# Patient Record
Sex: Female | Born: 1950 | Race: Black or African American | Hispanic: No | Smoking: Never smoker
Health system: Southern US, Community
[De-identification: ages and names within clinical notes are randomized; demographics above are authoritative.]

## PROBLEM LIST (undated history)

## (undated) DIAGNOSIS — I10 Essential (primary) hypertension: Secondary | ICD-10-CM

## (undated) DIAGNOSIS — H269 Unspecified cataract: Secondary | ICD-10-CM

## (undated) DIAGNOSIS — R7303 Prediabetes: Secondary | ICD-10-CM

## (undated) DIAGNOSIS — R112 Nausea with vomiting, unspecified: Secondary | ICD-10-CM

## (undated) DIAGNOSIS — D689 Coagulation defect, unspecified: Secondary | ICD-10-CM

## (undated) DIAGNOSIS — R011 Cardiac murmur, unspecified: Secondary | ICD-10-CM

## (undated) DIAGNOSIS — L988 Other specified disorders of the skin and subcutaneous tissue: Secondary | ICD-10-CM

## (undated) DIAGNOSIS — Z940 Kidney transplant status: Secondary | ICD-10-CM

## (undated) DIAGNOSIS — N189 Chronic kidney disease, unspecified: Secondary | ICD-10-CM

## (undated) HISTORY — PX: EYE SURGERY: SHX253

## (undated) HISTORY — PX: COLONOSCOPY: SHX174

## (undated) HISTORY — DX: Unspecified cataract: H26.9

## (undated) HISTORY — DX: Coagulation defect, unspecified: D68.9

## (undated) HISTORY — PX: TONSILLECTOMY: SUR1361

## (undated) HISTORY — PX: DILATION AND CURETTAGE OF UTERUS: SHX78

## (undated) HISTORY — DX: Prediabetes: R73.03

## (undated) HISTORY — PX: CHOLECYSTECTOMY: SHX55

## (undated) HISTORY — DX: Essential (primary) hypertension: I10

## (undated) HISTORY — PX: KIDNEY TRANSPLANT: SHX239

## (undated) HISTORY — PX: CARDIAC CATHETERIZATION: SHX172

## (undated) HISTORY — PX: POLYPECTOMY: SHX149

## (undated) HISTORY — DX: Kidney transplant status: Z94.0

## (undated) HISTORY — DX: Chronic kidney disease, unspecified: N18.9

---

## 1988-05-15 HISTORY — PX: HIP SURGERY: SHX245

## 2008-05-15 DIAGNOSIS — I2699 Other pulmonary embolism without acute cor pulmonale: Secondary | ICD-10-CM

## 2008-05-15 HISTORY — DX: Other pulmonary embolism without acute cor pulmonale: I26.99

## 2019-11-27 ENCOUNTER — Other Ambulatory Visit: Payer: Self-pay

## 2019-11-27 ENCOUNTER — Ambulatory Visit (INDEPENDENT_AMBULATORY_CARE_PROVIDER_SITE_OTHER): Payer: Medicare Other | Admitting: Emergency Medicine

## 2019-11-27 ENCOUNTER — Encounter: Payer: Self-pay | Admitting: Emergency Medicine

## 2019-11-27 VITALS — BP 136/81 | HR 67 | Temp 97.5°F | Resp 16 | Ht 61.0 in | Wt 206.0 lb

## 2019-11-27 DIAGNOSIS — R7303 Prediabetes: Secondary | ICD-10-CM | POA: Insufficient documentation

## 2019-11-27 DIAGNOSIS — Z940 Kidney transplant status: Secondary | ICD-10-CM | POA: Diagnosis not present

## 2019-11-27 DIAGNOSIS — I10 Essential (primary) hypertension: Secondary | ICD-10-CM | POA: Diagnosis not present

## 2019-11-27 DIAGNOSIS — E669 Obesity, unspecified: Secondary | ICD-10-CM | POA: Insufficient documentation

## 2019-11-27 DIAGNOSIS — E785 Hyperlipidemia, unspecified: Secondary | ICD-10-CM | POA: Diagnosis not present

## 2019-11-27 DIAGNOSIS — Z7689 Persons encountering health services in other specified circumstances: Secondary | ICD-10-CM

## 2019-11-27 DIAGNOSIS — Z6838 Body mass index (BMI) 38.0-38.9, adult: Secondary | ICD-10-CM | POA: Insufficient documentation

## 2019-11-27 LAB — COMPREHENSIVE METABOLIC PANEL
ALT: 7 IU/L (ref 0–32)
AST: 17 IU/L (ref 0–40)
Albumin/Globulin Ratio: 1.6 (ref 1.2–2.2)
Albumin: 4.1 g/dL (ref 3.8–4.8)
Alkaline Phosphatase: 68 IU/L (ref 48–121)
BUN/Creatinine Ratio: 12 (ref 12–28)
BUN: 20 mg/dL (ref 8–27)
Bilirubin Total: 0.8 mg/dL (ref 0.0–1.2)
CO2: 24 mmol/L (ref 20–29)
Calcium: 9.8 mg/dL (ref 8.7–10.3)
Chloride: 108 mmol/L — ABNORMAL HIGH (ref 96–106)
Creatinine, Ser: 1.71 mg/dL — ABNORMAL HIGH (ref 0.57–1.00)
GFR calc Af Amer: 35 mL/min/{1.73_m2} — ABNORMAL LOW (ref >59)
GFR calc non Af Amer: 31 mL/min/{1.73_m2} — ABNORMAL LOW (ref >59)
Globulin, Total: 2.5 g/dL (ref 1.5–4.5)
Glucose: 122 mg/dL — ABNORMAL HIGH (ref 65–99)
Potassium: 4 mmol/L (ref 3.5–5.2)
Sodium: 146 mmol/L — ABNORMAL HIGH (ref 134–144)
Total Protein: 6.6 g/dL (ref 6.0–8.5)

## 2019-11-27 LAB — CBC WITH DIFFERENTIAL/PLATELET
Basophils Absolute: 0.1 10*3/uL (ref 0.0–0.2)
Basos: 1 %
EOS (ABSOLUTE): 0.1 10*3/uL (ref 0.0–0.4)
Eos: 1 %
Hematocrit: 41.3 % (ref 34.0–46.6)
Hemoglobin: 13.6 g/dL (ref 11.1–15.9)
Immature Grans (Abs): 0 10*3/uL (ref 0.0–0.1)
Immature Granulocytes: 0 %
Lymphocytes Absolute: 1.5 10*3/uL (ref 0.7–3.1)
Lymphs: 21 %
MCH: 28.4 pg (ref 26.6–33.0)
MCHC: 32.9 g/dL (ref 31.5–35.7)
MCV: 86 fL (ref 79–97)
Monocytes Absolute: 1 10*3/uL — ABNORMAL HIGH (ref 0.1–0.9)
Monocytes: 13 %
Neutrophils Absolute: 4.5 10*3/uL (ref 1.4–7.0)
Neutrophils: 64 %
Platelets: 263 10*3/uL (ref 150–450)
RBC: 4.79 x10E6/uL (ref 3.77–5.28)
RDW: 13.5 % (ref 11.7–15.4)
WBC: 7.1 10*3/uL (ref 3.4–10.8)

## 2019-11-27 LAB — LIPID PANEL
Chol/HDL Ratio: 2.8 ratio (ref 0.0–4.4)
Cholesterol, Total: 203 mg/dL — ABNORMAL HIGH (ref 100–199)
HDL: 73 mg/dL (ref >39)
LDL Chol Calc (NIH): 116 mg/dL — ABNORMAL HIGH (ref 0–99)
Triglycerides: 81 mg/dL (ref 0–149)
VLDL Cholesterol Cal: 14 mg/dL (ref 5–40)

## 2019-11-27 LAB — HEMOGLOBIN A1C
Est. average glucose Bld gHb Est-mCnc: 137 mg/dL
Hgb A1c MFr Bld: 6.4 % — ABNORMAL HIGH (ref 4.8–5.6)

## 2019-11-27 NOTE — Patient Instructions (Addendum)
   If you have lab work done today you will be contacted with your lab results within the next 2 weeks.  If you have not heard from us then please contact us. The fastest way to get your results is to register for My Chart.   IF you received an x-ray today, you will receive an invoice from [facility]. Please contact [facility] at [phone] with questions or concerns regarding your invoice.   IF you received labwork today, you will receive an invoice from [facility]. Please contact [facility] at [phone] with questions or concerns regarding your invoice.   Our billing staff will not be able to assist you with questions regarding bills from these companies.  You will be contacted with the lab results as soon as they are available. The fastest way to get your results is to activate your My Chart account. Instructions are located on the last page of this paperwork. If you have not heard from us regarding the results in 2 weeks, please contact this office.      Hypertension, Adult High blood pressure (hypertension) is when the force of blood pumping through the arteries is too strong. The arteries are the blood vessels that carry blood from the heart throughout the body. Hypertension forces the heart to work harder to pump blood and may cause arteries to become narrow or stiff. Untreated or uncontrolled hypertension can cause a heart attack, heart failure, a stroke, kidney disease, and other problems. A blood pressure reading consists of a higher number over a lower number. Ideally, your blood pressure should be below 120/80. The first ("top") number is called the systolic pressure. It is a measure of the pressure in your arteries as your heart beats. The second ("bottom") number is called the diastolic pressure. It is a measure of the pressure in your arteries as the heart relaxes. What are the causes? The exact cause of this condition is not known. There are some conditions  that result in or are related to high blood pressure. What increases the risk? Some risk factors for high blood pressure are under your control. The following factors may make you more likely to develop this condition:  Smoking.  Having type 2 diabetes mellitus, high cholesterol, or both.  Not getting enough exercise or physical activity.  Being overweight.  Having too much fat, sugar, calories, or salt (sodium) in your diet.  Drinking too much alcohol. Some risk factors for high blood pressure may be difficult or impossible to change. Some of these factors include:  Having chronic kidney disease.  Having a family history of high blood pressure.  Age. Risk increases with age.  Race. You may be at higher risk if you are African American.  Gender. Men are at higher risk than women before age 45. After age 65, women are at higher risk than men.  Having obstructive sleep apnea.  Stress. What are the signs or symptoms? High blood pressure may not cause symptoms. Very high blood pressure (hypertensive crisis) may cause:  Headache.  Anxiety.  Shortness of breath.  Nosebleed.  Nausea and vomiting.  Vision changes.  Severe chest pain.  Seizures. How is this diagnosed? This condition is diagnosed by measuring your blood pressure while you are seated, with your arm resting on a flat surface, your legs uncrossed, and your feet flat on the floor. The cuff of the blood pressure monitor will be placed directly against the skin of your upper arm at the level of your   heart. It should be measured at least twice using the same arm. Certain conditions can cause a difference in blood pressure between your right and left arms. Certain factors can cause blood pressure readings to be lower or higher than normal for a short period of time:  When your blood pressure is higher when you are in a health care provider's office than when you are at home, this is called white coat hypertension.  Most people with this condition do not need medicines.  When your blood pressure is higher at home than when you are in a health care provider's office, this is called masked hypertension. Most people with this condition may need medicines to control blood pressure. If you have a high blood pressure reading during one visit or you have normal blood pressure with other risk factors, you may be asked to:  Return on a different day to have your blood pressure checked again.  Monitor your blood pressure at home for 1 week or longer. If you are diagnosed with hypertension, you may have other blood or imaging tests to help your health care provider understand your overall risk for other conditions. How is this treated? This condition is treated by making healthy lifestyle changes, such as eating healthy foods, exercising more, and reducing your alcohol intake. Your health care provider may prescribe medicine if lifestyle changes are not enough to get your blood pressure under control, and if:  Your systolic blood pressure is above 130.  Your diastolic blood pressure is above 80. Your personal target blood pressure may vary depending on your medical conditions, your age, and other factors. Follow these instructions at home: Eating and drinking   Eat a diet that is high in fiber and potassium, and low in sodium, added sugar, and fat. An example eating plan is called the DASH (Dietary Approaches to Stop Hypertension) diet. To eat this way: ? Eat plenty of fresh fruits and vegetables. Try to fill one half of your plate at each meal with fruits and vegetables. ? Eat whole grains, such as whole-wheat pasta, brown rice, or whole-grain bread. Fill about one fourth of your plate with whole grains. ? Eat or drink low-fat dairy products, such as skim milk or low-fat yogurt. ? Avoid fatty cuts of meat, processed or cured meats, and poultry with skin. Fill about one fourth of your plate with lean proteins, such  as fish, chicken without skin, beans, eggs, or tofu. ? Avoid pre-made and processed foods. These tend to be higher in sodium, added sugar, and fat.  Reduce your daily sodium intake. Most people with hypertension should eat less than 1,500 mg of sodium a day.  Do not drink alcohol if: ? Your health care provider tells you not to drink. ? You are pregnant, may be pregnant, or are planning to become pregnant.  If you drink alcohol: ? Limit how much you use to:  0-1 drink a day for women.  0-2 drinks a day for men. ? Be aware of how much alcohol is in your drink. In the [place] one drink equals one 12 oz bottle of beer (355 mL), one 5 oz glass of wine (148 mL), or one 1 oz glass of hard liquor (44 mL). Lifestyle   Work with your health care provider to maintain a healthy body weight or to lose weight. Ask what an ideal weight is for you.  Get at least 30 minutes of exercise most days of the week. Activities may include walking, swimming,   or biking.  Include exercise to strengthen your muscles (resistance exercise), such as Pilates or lifting weights, as part of your weekly exercise routine. Try to do these types of exercises for 30 minutes at least 3 days a week.  Do not use any products that contain nicotine or tobacco, such as cigarettes, e-cigarettes, and chewing tobacco. If you need help quitting, ask your health care provider.  Monitor your blood pressure at home as told by your health care provider.  Keep all follow-up visits as told by your health care provider. This is important. Medicines  Take over-the-counter and prescription medicines only as told by your health care provider. Follow directions carefully. Blood pressure medicines must be taken as prescribed.  Do not skip doses of blood pressure medicine. Doing this puts you at risk for problems and can make the medicine less effective.  Ask your health care provider about side effects or reactions to medicines that you  should watch for. Contact a health care provider if you:  Think you are having a reaction to a medicine you are taking.  Have headaches that keep coming back (recurring).  Feel dizzy.  Have swelling in your ankles.  Have trouble with your vision. Get help right away if you:  Develop a severe headache or confusion.  Have unusual weakness or numbness.  Feel faint.  Have severe pain in your chest or abdomen.  Vomit repeatedly.  Have trouble breathing. Summary  Hypertension is when the force of blood pumping through your arteries is too strong. If this condition is not controlled, it may put you at risk for serious complications.  Your personal target blood pressure may vary depending on your medical conditions, your age, and other factors. For most people, a normal blood pressure is less than 120/80.  Hypertension is treated with lifestyle changes, medicines, or a combination of both. Lifestyle changes include losing weight, eating a healthy, low-sodium diet, exercising more, and limiting alcohol. This information is not intended to replace advice given to you by your health care provider. Make sure you discuss any questions you have with your health care provider. Document Revised: 01/09/2018 Document Reviewed: 01/09/2018 Elsevier Patient Education  2020 Elsevier Inc.  

## 2019-11-27 NOTE — Progress Notes (Signed)
Megan Bradford 69 y.o.   Chief Complaint  Patient presents with  . Establish Care    moved here from [place] about 4 months ago  . Hypertension    and taking cholesterol medication    HISTORY OF PRESENT ILLNESS: This is a 69 y.o. female first visit to this office, here to establish care with me. Recently moved from [facility] about 4 months ago. Has the following chronic medical problems: 1.  Status post kidney transplant about 5 years ago.  Needs nephrology and oncology referrals. 2.  Hypertension: On amlodipine and carvedilol. 3.  Dyslipidemia: Simvastatin 4.  History of bilateral cataracts. 5.  History of prediabetes  Fully vaccinated against Covid. Denies any other significant past medical history. No other complaints or medical concerns.  HPI   Prior to Admission medications   Medication Sig Start Date End Date Taking? Authorizing Provider  amLODipine (NORVASC) 5 MG tablet Take 5 mg by mouth daily.   Yes [provider]  aspirin EC 81 MG tablet Take 81 mg by mouth daily. Swallow whole.   Yes [provider]  carvedilol (COREG) 12.5 MG tablet Take 12.5 mg by mouth 2 (two) times daily with a meal.   Yes [provider]  magnesium oxide (MAG-OX) 400 MG tablet Take 400 mg by mouth daily.   Yes [provider]  multivitamin-lutein (OCUVITE-LUTEIN) CAPS capsule Take 1 capsule by mouth daily.   Yes [provider]  mycophenolate (MYFORTIC) 180 MG EC tablet Take 180 mg by mouth 2 (two) times daily.   Yes [provider]  pyridOXINE (VITAMIN B-6) 100 MG tablet Take 100 mg by mouth daily.   Yes [provider]  simvastatin (ZOCOR) 20 MG tablet Take 20 mg by mouth daily.   Yes [provider]  Tacrolimus ER 1 MG CP24 Take by mouth in the morning and at bedtime.   Yes [provider]    No Known Allergies  There are no problems to display for this patient.   Past Medical History:   Diagnosis Date  . Cataract    [employer]rees[employer] 11/24/2019  . Chronic kidney disease    [employer]rees[employer] 11/24/2019  . Hypertension    [employer] 11/24/2019    Past Surgical History:  Procedure Laterality Date  . CESAREAN SECTION N/A    [employer] 11/24/2019  . CHOLECYSTECTOMY N/A    [employer] 11/24/2019  . EYE SURGERY N/A    [employer] 11/24/2019    Social History   Socioeconomic History  . Marital status: Widowed    Spouse name: Not on file  . Number of children: Not on file  . Years of education: Not on file  . Highest education level: Not on file  Occupational History  . Not on file  Tobacco Use  . Smoking status: Never Smoker  . Smokeless tobacco: Never Used  Substance and Sexual Activity  . Alcohol use: Never  . Drug use: Never  . Sexual activity: Not on file  Other Topics Concern  . Not on file  Social History Narrative  . Not on file   Social Determinants of Health   Financial Resource Strain:   . Difficulty of Paying Living Expenses:   Food Insecurity:   . Worried About Charity fundraiser in the Last Year:   . Arboriculturist in the Last Year:   Transportation Needs:   . Film/video editor (Medical):   Marland Kitchen Lack of Transportation (Non-Medical):   Physical Activity:   .  Days of Exercise per Week:   . Minutes of Exercise per Session:   Stress:   . Feeling of Stress :   Social Connections:   . Frequency of Communication with Friends and Family:   . Frequency of Social Gatherings with Friends and Family:   . Attends Religious Services:   . Active Member of Clubs or Organizations:   . Attends Archivist Meetings:   Marland Kitchen Marital Status:   Intimate Partner Violence:   . Fear of Current or Ex-Partner:   . Emotionally Abused:   Marland Kitchen Physically Abused:   . Sexually Abused:     History reviewed. No pertinent family history.   Review of Systems  Constitutional: Negative.  Negative for chills and fever.  HENT: Negative.  Negative for congestion and sore throat.    Respiratory: Negative.  Negative for cough and shortness of breath.   Cardiovascular: Negative.  Negative for chest pain and palpitations.  Gastrointestinal: Negative for abdominal pain, diarrhea, nausea and vomiting.  Genitourinary: Negative.  Negative for dysuria and hematuria.  Skin: Negative.  Negative for rash.  Neurological: Negative.  Negative for dizziness and headaches.  All other systems reviewed and are negative.    Today's Vitals   11/27/19 0905  BP: 136/81  Pulse: 67  Resp: 16  Temp: (!) 97.5 F (36.4 C)  TempSrc: Temporal  SpO2: 97%  Weight: 206 lb (93.4 kg)  Height: 5\' 1"  (1.549 m)   Body mass index is 38.92 kg/m.   Physical Exam Vitals reviewed.  Constitutional:      Appearance: Normal appearance. She is obese.  HENT:     Head: Normocephalic.  Eyes:     Extraocular Movements: Extraocular movements intact.     Conjunctiva/sclera: Conjunctivae normal.     Pupils: Pupils are equal, round, and reactive to light.  Cardiovascular:     Rate and Rhythm: Normal rate and regular rhythm.     Heart sounds: Normal heart sounds.  Pulmonary:     Effort: Pulmonary effort is normal.     Breath sounds: Normal breath sounds.  Musculoskeletal:        General: Normal range of motion.     Cervical back: Normal range of motion.  Skin:    General: Skin is warm and dry.     Capillary Refill: Capillary refill takes less than 2 seconds.  Neurological:     General: No focal deficit present.     Mental Status: She is alert and oriented to person, place, and time.  Psychiatric:        Mood and Affect: Mood normal.        Behavior: Behavior normal.      ASSESSMENT & PLAN: Megan Bradford was seen today for establish care and hypertension.  Diagnoses and all orders for this visit:  Encounter to establish care  History of kidney transplant -     Ambulatory referral to Nephrology -     Ambulatory referral to Oncology  Essential hypertension -     CBC with  Differential/Platelet -     Comprehensive metabolic panel  Dyslipidemia -     Lipid panel  Prediabetes -     Hemoglobin A1c  Body mass index (BMI) of 38.0-38.9 in adult    Patient Instructions       If you have lab work done today you will be contacted with your lab results within the next 2 weeks.  If you have not heard from [place] then please contact us. The  fastest way to get your results is to register for My Chart.   IF you received an x-ray today, you will receive an invoice from [facility]. Please contact [facility] at [phone] with questions or concerns regarding your invoice.   IF you received labwork today, you will receive an invoice from [facility]. Please contact [facility] at [phone] with questions or concerns regarding your invoice.   Our billing staff will not be able to assist you with questions regarding bills from these companies.  You will be contacted with the lab results as soon as they are available. The fastest way to get your results is to activate your My Chart account. Instructions are located on the last page of this paperwork. If you have not heard from [place] regarding the results in 2 weeks, please contact this office.     Hypertension, Adult High blood pressure (hypertension) is when the force of blood pumping through the arteries is too strong. The arteries are the blood vessels that carry blood from the heart throughout the body. Hypertension forces the heart to work harder to pump blood and may cause arteries to become narrow or stiff. Untreated or uncontrolled hypertension can cause a heart attack, heart failure, a stroke, kidney disease, and other problems. A blood pressure reading consists of a higher number over a lower number. Ideally, your blood pressure should be below 120/80. The first ("top") number is called the systolic pressure. It is a measure of the pressure in your arteries as your heart beats. The second ("bottom")  number is called the diastolic pressure. It is a measure of the pressure in your arteries as the heart relaxes. What are the causes? The exact cause of this condition is not known. There are some conditions that result in or are related to high blood pressure. What increases the risk? Some risk factors for high blood pressure are under your control. The following factors may make you more likely to develop this condition:  Smoking.  Having type 2 diabetes mellitus, high cholesterol, or both.  Not getting enough exercise or physical activity.  Being overweight.  Having too much fat, sugar, calories, or salt (sodium) in your diet.  Drinking too much alcohol. Some risk factors for high blood pressure may be difficult or impossible to change. Some of these factors include:  Having chronic kidney disease.  Having a family history of high blood pressure.  Age. Risk increases with age.  Race. You may be at higher risk if you are African American.  Gender. Men are at higher risk than women before age 20. After age 70, women are at higher risk than men.  Having obstructive sleep apnea.  Stress. What are the signs or symptoms? High blood pressure may not cause symptoms. Very high blood pressure (hypertensive crisis) may cause:  Headache.  Anxiety.  Shortness of breath.  Nosebleed.  Nausea and vomiting.  Vision changes.  Severe chest pain.  Seizures. How is this diagnosed? This condition is diagnosed by measuring your blood pressure while you are seated, with your arm resting on a flat surface, your legs uncrossed, and your feet flat on the floor. The cuff of the blood pressure monitor will be placed directly against the skin of your upper arm at the level of your heart. It should be measured at least twice using the same arm. Certain conditions can cause a difference in blood pressure between your right and left arms. Certain factors can cause blood pressure readings to be  lower or higher than normal for a short period of time:  When your blood pressure is higher when you are in a health care provider's office than when you are at home, this is called white coat hypertension. Most people with this condition do not need medicines.  When your blood pressure is higher at home than when you are in a health care provider's office, this is called masked hypertension. Most people with this condition may need medicines to control blood pressure. If you have a high blood pressure reading during one visit or you have normal blood pressure with other risk factors, you may be asked to:  Return on a different day to have your blood pressure checked again.  Monitor your blood pressure at home for 1 week or longer. If you are diagnosed with hypertension, you may have other blood or imaging tests to help your health care provider understand your overall risk for other conditions. How is this treated? This condition is treated by making healthy lifestyle changes, such as eating healthy foods, exercising more, and reducing your alcohol intake. Your health care provider may prescribe medicine if lifestyle changes are not enough to get your blood pressure under control, and if:  Your systolic blood pressure is above 130.  Your diastolic blood pressure is above 80. Your personal target blood pressure may vary depending on your medical conditions, your age, and other factors. Follow these instructions at home: Eating and drinking   Eat a diet that is high in fiber and potassium, and low in sodium, added sugar, and fat. An example eating plan is called the DASH (Dietary Approaches to Stop Hypertension) diet. To eat this way: ? Eat plenty of fresh fruits and vegetables. Try to fill one half of your plate at each meal with fruits and vegetables. ? Eat whole grains, such as whole-wheat pasta, brown rice, or whole-grain bread. Fill about one fourth of your plate with whole grains. ? Eat  or drink low-fat dairy products, such as skim milk or low-fat yogurt. ? Avoid fatty cuts of meat, processed or cured meats, and poultry with skin. Fill about one fourth of your plate with lean proteins, such as fish, chicken without skin, beans, eggs, or tofu. ? Avoid pre-made and processed foods. These tend to be higher in sodium, added sugar, and fat.  Reduce your daily sodium intake. Most people with hypertension should eat less than 1,500 mg of sodium a day.  Do not drink alcohol if: ? Your health care provider tells you not to drink. ? You are pregnant, may be pregnant, or are planning to become pregnant.  If you drink alcohol: ? Limit how much you use to:  0-1 drink a day for women.  0-2 drinks a day for men. ? Be aware of how much alcohol is in your drink. In the [place] one drink equals one 12 oz bottle of beer (355 mL), one 5 oz glass of wine (148 mL), or one 1 oz glass of hard liquor (44 mL). Lifestyle   Work with your health care provider to maintain a healthy body weight or to lose weight. Ask what an ideal weight is for you.  Get at least 30 minutes of exercise most days of the week. Activities may include walking, swimming, or biking.  Include exercise to strengthen your muscles (resistance exercise), such as Pilates or lifting weights, as part of your weekly exercise routine. Try to do these types of exercises for 30 minutes at least 3  days a week.  Do not use any products that contain nicotine or tobacco, such as cigarettes, e-cigarettes, and chewing tobacco. If you need help quitting, ask your health care provider.  Monitor your blood pressure at home as told by your health care provider.  Keep all follow-up visits as told by your health care provider. This is important. Medicines  Take over-the-counter and prescription medicines only as told by your health care provider. Follow directions carefully. Blood pressure medicines must be taken as prescribed.  Do not skip  doses of blood pressure medicine. Doing this puts you at risk for problems and can make the medicine less effective.  Ask your health care provider about side effects or reactions to medicines that you should watch for. Contact a health care provider if you:  Think you are having a reaction to a medicine you are taking.  Have headaches that keep coming back (recurring).  Feel dizzy.  Have swelling in your ankles.  Have trouble with your vision. Get help right away if you:  Develop a severe headache or confusion.  Have unusual weakness or numbness.  Feel faint.  Have severe pain in your chest or abdomen.  Vomit repeatedly.  Have trouble breathing. Summary  Hypertension is when the force of blood pumping through your arteries is too strong. If this condition is not controlled, it may put you at risk for serious complications.  Your personal target blood pressure may vary depending on your medical conditions, your age, and other factors. For most people, a normal blood pressure is less than 120/80.  Hypertension is treated with lifestyle changes, medicines, or a combination of both. Lifestyle changes include losing weight, eating a healthy, low-sodium diet, exercising more, and limiting alcohol. This information is not intended to replace advice given to you by your health care provider. Make sure you discuss any questions you have with your health care provider. Document Revised: 01/09/2018 Document Reviewed: 01/09/2018 Elsevier Patient Education  2020 Elsevier Inc.      Agustina Caroli, MD [facility]

## 2019-12-01 ENCOUNTER — Encounter (HOSPITAL_COMMUNITY): Payer: Self-pay

## 2019-12-01 ENCOUNTER — Ambulatory Visit (HOSPITAL_COMMUNITY)
Admission: EM | Admit: 2019-12-01 | Discharge: 2019-12-01 | Disposition: A | Discharge status: Home / Self Care | Payer: Medicare Other | Attending: Emergency Medicine | Admitting: Emergency Medicine

## 2019-12-01 ENCOUNTER — Other Ambulatory Visit: Payer: Self-pay

## 2019-12-01 DIAGNOSIS — Z940 Kidney transplant status: Secondary | ICD-10-CM | POA: Insufficient documentation

## 2019-12-01 DIAGNOSIS — R7303 Prediabetes: Secondary | ICD-10-CM | POA: Insufficient documentation

## 2019-12-01 DIAGNOSIS — Z7901 Long term (current) use of anticoagulants: Secondary | ICD-10-CM | POA: Insufficient documentation

## 2019-12-01 DIAGNOSIS — Z7982 Long term (current) use of aspirin: Secondary | ICD-10-CM | POA: Insufficient documentation

## 2019-12-01 DIAGNOSIS — M546 Pain in thoracic spine: Secondary | ICD-10-CM | POA: Diagnosis not present

## 2019-12-01 DIAGNOSIS — Z79899 Other long term (current) drug therapy: Secondary | ICD-10-CM | POA: Diagnosis not present

## 2019-12-01 DIAGNOSIS — I129 Hypertensive chronic kidney disease with stage 1 through stage 4 chronic kidney disease, or unspecified chronic kidney disease: Secondary | ICD-10-CM | POA: Insufficient documentation

## 2019-12-01 DIAGNOSIS — Z20822 Contact with and (suspected) exposure to covid-19: Secondary | ICD-10-CM | POA: Insufficient documentation

## 2019-12-01 DIAGNOSIS — R509 Fever, unspecified: Secondary | ICD-10-CM | POA: Insufficient documentation

## 2019-12-01 DIAGNOSIS — M542 Cervicalgia: Secondary | ICD-10-CM | POA: Insufficient documentation

## 2019-12-01 DIAGNOSIS — N189 Chronic kidney disease, unspecified: Secondary | ICD-10-CM | POA: Diagnosis not present

## 2019-12-01 DIAGNOSIS — E785 Hyperlipidemia, unspecified: Secondary | ICD-10-CM | POA: Insufficient documentation

## 2019-12-01 MED ORDER — DICLOFENAC SODIUM 1 % EX GEL: 2.0000 g | Freq: Four times a day (QID) | CUTANEOUS | 0 refills | Status: DC

## 2019-12-01 MED ORDER — CYCLOBENZAPRINE HCL 5 MG PO TABS: 5.0000 mg | ORAL_TABLET | Freq: Every day | ORAL | 0 refills | Status: AC

## 2019-12-01 NOTE — ED Triage Notes (Signed)
Pt is here with neck/back stiffness, body aches that started Friday morning after sleeping with the [facility] on all night. Pt has taken Tylenol to relieve discomfort.

## 2019-12-01 NOTE — Discharge Instructions (Addendum)
COVID test pending, monitor MyChart for results, we will call if positive Continue tylenol [phone] mg every 4-6 hours Flexeril at bedtime May try diclofenac cream 4 times daily to neck/back Gentle stretching  Follow up if fevers/pain not improving or worsening

## 2019-12-02 LAB — SARS CORONAVIRUS 2 (TAT 6-24 HRS): SARS Coronavirus 2: NEGATIVE

## 2019-12-02 NOTE — ED Provider Notes (Signed)
[place]    CSN: [id number] Arrival date & time: 12/01/19  1720      History   Chief Complaint Chief Complaint  Patient presents with   Generalized Body Aches   neck/back stiffness    HPI Megan Bradford is a 69 y.o. female history of CKD/prior kidney transplant, hypertension, presenting today for evaluation of neck and back ache/stiffness.  Patient reports that symptoms began on Friday, and have persisted over the past 3 days.  She did note a fever up to 102 yesterday.  Denies any injury fall trauma increase in activity or heavy lifting of recently.  She has been trying Tylenol, Tylenol PM heat ice and massage without relief.  She does report decreased appetite but has been trying to stay hydrated.  Denies URI symptoms, denies GI symptoms, denies urinary symptoms.  Main complaint is neck and back aches.  Reports she was previously Covid vaccinated, but had antibodies checked 1 month after vaccination which were negative.  HPI  Past Medical History:  Diagnosis Date   Cataract    Phreesia 11/24/2019   Chronic kidney disease    Phreesia 11/24/2019   Hypertension    Phreesia 11/24/2019    Patient Active Problem List   Diagnosis Date Noted   History of kidney transplant 11/27/2019   Essential hypertension 11/27/2019   Dyslipidemia 11/27/2019   Prediabetes 11/27/2019   Body mass index (BMI) of 38.0-38.9 in adult 11/27/2019    Past Surgical History:  Procedure Laterality Date   CESAREAN SECTION N/A    Phreesia 11/24/2019   CHOLECYSTECTOMY N/A    Phreesia 11/24/2019   EYE SURGERY N/A    Phreesia 11/24/2019    OB History   No obstetric history on file.      Home Medications    Prior to Admission medications   Medication Sig Start Date End Date Taking? Authorizing Provider  amLODipine (NORVASC) 5 MG tablet Take 5 mg by mouth daily.    [provider]  aspirin EC 81 MG tablet Take 81 mg by mouth daily. Swallow whole.    [provider]  carvedilol (COREG) 12.5 MG tablet Take 12.5 mg by mouth 2 (two) times daily with a meal.    [provider]  cyclobenzaprine (FLEXERIL) 5 MG tablet Take 1 tablet (5 mg total) by mouth at bedtime for 7 days. 12/01/19 12/08/19  Mcihael Hinderman C, [name]  diclofenac Sodium (VOLTAREN) 1 % GEL Apply 2 g topically 4 (four) times daily. 12/01/19   Eryka Dolinger C, [name]  magnesium oxide (MAG-OX) 400 MG tablet Take 400 mg by mouth daily.    [provider]  multivitamin-lutein (OCUVITE-LUTEIN) CAPS capsule Take 1 capsule by mouth daily.    [provider]  mycophenolate (MYFORTIC) 180 MG EC tablet Take 180 mg by mouth 2 (two) times daily.    [provider]  pyridOXINE (VITAMIN B-6) 100 MG tablet Take 100 mg by mouth daily.    [provider]  RYBELSUS 3 MG TABS Take 1 tablet by mouth daily. 10/07/19   [provider]  simvastatin (ZOCOR) 20 MG tablet Take 20 mg by mouth daily.    [provider]  tacrolimus (PROGRAF) 1 MG capsule Take 4 mg by mouth 2 (two) times daily. 10/31/19   [provider]  Tacrolimus ER 1 MG CP24 Take by mouth in the morning and at bedtime.    [provider]    Family History Family History  Problem Relation Age of  Onset   Diabetes Mother    Asthma Mother    Hypertension Father    Coronary artery disease Father     Social History Social History   Tobacco Use   Smoking status: Never Smoker   Smokeless tobacco: Never Used  Substance Use Topics   Alcohol use: Never   Drug use: Never     Allergies   Patient has no known allergies.   Review of Systems Review of Systems  Constitutional: Positive for fatigue and fever. Negative for activity change, appetite change and chills.  HENT: Negative for congestion, ear pain, rhinorrhea, sinus pressure, sore throat and trouble swallowing.   Eyes: Negative for discharge and redness.  Respiratory: Negative for cough, chest  tightness and shortness of breath.   Cardiovascular: Negative for chest pain.  Gastrointestinal: Negative for abdominal pain, diarrhea, nausea and vomiting.  Musculoskeletal: Positive for back pain, myalgias, neck pain and neck stiffness.  Skin: Negative for rash.  Neurological: Negative for dizziness, light-headedness and headaches.     Physical Exam Triage Vital Signs ED Triage Vitals  Enc Vitals Group     BP 12/01/19 1826 (!) 165/98     Pulse Rate 12/01/19 1826 91     Resp 12/01/19 1826 19     Temp 12/01/19 1826 99.9 F (37.7 C)     Temp Source 12/01/19 1826 Oral     SpO2 12/01/19 1826 99 %     Weight 12/01/19 1822 207 lb 9.6 oz (94.2 kg)     Height --      Head Circumference --      Peak Flow --      Pain Score 12/01/19 1816 0     Pain Loc --      Pain Edu? --      Excl. in [place]? --    No data found.  Updated Vital Signs BP (!) 165/98 (BP Location: Right Arm)    Pulse 91    Temp 99.9 F (37.7 C) (Oral)    Resp 19    Wt 207 lb 9.6 oz (94.2 kg)    SpO2 99%    BMI 39.23 kg/m   Visual Acuity Right Eye Distance:   Left Eye Distance:   Bilateral Distance:    Right Eye Near:   Left Eye Near:    Bilateral Near:     Physical Exam Vitals and nursing note reviewed.  Constitutional:      Appearance: She is well-developed.     Comments: No acute distress  HENT:     Head: Normocephalic and atraumatic.     Nose: Nose normal.  Eyes:     Conjunctiva/sclera: Conjunctivae normal.  Cardiovascular:     Rate and Rhythm: Normal rate.  Pulmonary:     Effort: Pulmonary effort is normal. No respiratory distress.  Abdominal:     General: There is no distension.  Musculoskeletal:        General: Normal range of motion.     Cervical back: Neck supple.     Comments: Neck and upper thoracic area with diffuse tenderness to cervical and thoracic spine midline, increased tenderness throughout bilateral cervical and superior thoracic musculature bilaterally Full active range of motion  of neck, full active range of motion of shoulders Negative Kernig/Brudzinski  Skin:    General: Skin is warm and dry.  Neurological:     Mental Status: She is alert and oriented to person, place, and time.      UC Treatments / Results  Labs (all labs ordered are listed, but only abnormal results are displayed) Labs Reviewed  SARS CORONAVIRUS 2 (TAT 6-24 HRS)    EKG   Radiology No results found.  Procedures Procedures (including critical care time)  Medications Ordered in UC Medications - No data to display  Initial Impression / Assessment and Plan / UC Course  I have reviewed the triage vital signs and the nursing notes.  Pertinent labs & imaging results that were available during my care of the patient were reviewed by me and considered in my medical decision making (see chart for details).     Covid test pending, low-grade fever in clinic today, does have neck and back pain, but no nuchal rigidity, low suspicion of meningitis at this time, but advised patient if symptoms worsen along with persistent fevers to follow-up in emergency room.  At this time will treat symptomatically and supportively with Tylenol, avoiding NSAIDs due to kidneys.  Voltaren topically, Flexeril at bedtime.  Discussed strict return precautions. Patient verbalized understanding and is agreeable with plan.  Final Clinical Impressions(s) / UC Diagnoses   Final diagnoses:  Fever, unspecified  Musculoskeletal neck pain  Acute bilateral thoracic back pain     Discharge Instructions     COVID test pending, monitor MyChart for results, we will call if positive Continue tylenol [phone] mg every 4-6 hours Flexeril at bedtime May try diclofenac cream 4 times daily to neck/back Gentle stretching  Follow up if fevers/pain not improving or worsening   ED Prescriptions    Medication Sig Dispense Auth. Provider   cyclobenzaprine (FLEXERIL) 5 MG tablet Take 1 tablet (5 mg total) by mouth at  bedtime for 7 days. 7 tablet [place] Youngblood C, [name]   diclofenac Sodium (VOLTAREN) 1 % GEL Apply 2 g topically 4 (four) times daily. 100 g Purnell Daigle, [place] C, [name]     PDMP not reviewed this encounter.   Joneen Caraway [place] C, [name] 12/02/19 1213

## 2019-12-17 ENCOUNTER — Ambulatory Visit (INDEPENDENT_AMBULATORY_CARE_PROVIDER_SITE_OTHER): Payer: Medicare Other | Admitting: Emergency Medicine

## 2019-12-17 ENCOUNTER — Other Ambulatory Visit: Payer: Self-pay

## 2019-12-17 ENCOUNTER — Encounter: Payer: Self-pay | Admitting: Emergency Medicine

## 2019-12-17 VITALS — BP 130/80 | HR 78 | Temp 97.7°F | Ht 60.0 in | Wt 204.8 lb

## 2019-12-17 DIAGNOSIS — M542 Cervicalgia: Secondary | ICD-10-CM

## 2019-12-17 DIAGNOSIS — N1832 Chronic kidney disease, stage 3b: Secondary | ICD-10-CM | POA: Diagnosis not present

## 2019-12-17 DIAGNOSIS — Z940 Kidney transplant status: Secondary | ICD-10-CM

## 2019-12-17 NOTE — Patient Instructions (Signed)
Cervical Sprain  A cervical sprain is a stretch or tear in the tissues that connect bones (ligaments) in the neck. Most neck (cervical) sprains get better in 4-6 weeks. Follow these instructions at home: If you have a neck collar:  Wear it as told by your doctor. Do not take off (do not remove) the collar unless your doctor says that this is safe.  Ask your doctor before adjusting your collar.  If you have long hair, keep it outside of the collar.  Ask your doctor if you may take off the collar for cleaning and bathing. If you may take off the collar: ? Follow instructions from your doctor about how to take off the collar safely. ? Clean the collar by wiping it with mild soap and water. Let it air-dry all the way. ? If your collar has removable pads:  Take the pads out every 1-2 days.  Hand wash the pads with soap and water.  Let the pads air-dry all the way before you put them back in the collar. Do not dry them in a clothes dryer. Do not dry them with a hair dryer. ? Check your skin under the collar for irritation or sores. If you see any, tell your doctor. Managing pain, stiffness, and swelling   Use a cervical traction device, if told by your doctor.  If told, put heat on the affected area. Do this before exercises (physical therapy) or as often as told by your doctor. Use the heat source that your doctor recommends, such as a moist heat pack or a heating pad. ? Place a towel between your skin and the heat source. ? Leave the heat on for 20-30 minutes. ? Take the heat off (remove the heat) if your skin turns bright red. This is very important if you cannot feel pain, heat, or cold. You may have a greater risk of getting burned.  Put ice on the affected area. ? Put ice in a plastic bag. ? Place a towel between your skin and the bag. ? Leave the ice on for 20 minutes, 2-3 times a day. Activity  Do not drive while wearing a neck collar. If you do not have a neck collar, ask  your doctor if it is safe to drive.  Do not drive or use heavy machinery while taking prescription pain medicine or muscle relaxants, unless your doctor approves.  Do not lift anything that is heavier than 10 lb (4.5 kg) until your doctor tells you that it is safe.  Rest as told by your doctor.  Avoid activities that make you feel worse. Ask your doctor what activities are safe for you.  Do exercises as told by your doctor or physical therapist. Preventing neck sprain  Practice good posture. Adjust your workstation to help with this, if needed.  Exercise regularly as told by your doctor or physical therapist.  Avoid activities that are risky or may cause a neck sprain (cervical sprain). General instructions  Take over-the-counter and prescription medicines only as told by your doctor.  Do not use any products that contain nicotine or tobacco. This includes cigarettes and e-cigarettes. If you need help quitting, ask your doctor.  Keep all follow-up visits as told by your doctor. This is important. Contact a doctor if:  You have pain or other symptoms that get worse.  You have symptoms that do not get better after 2 weeks.  You have pain that does not get better with medicine.  You start to   have new, unexplained symptoms.  You have sores or irritated skin from wearing your neck collar. Get help right away if:  You have very bad pain.  You have any of the following in any part of your body: ? Loss of feeling (numbness). ? Tingling. ? Weakness.  You cannot move a part of your body (you have paralysis).  Your activity level does not improve. Summary  A cervical sprain is a stretch or tear in the tissues that connect bones (ligaments) in the neck.  If you have a neck (cervical) collar, do not take off the collar unless your doctor says that this is safe.  Put ice on affected areas as told by your doctor.  Put heat on affected areas as told by your doctor.  Good  posture and regular exercise can help prevent a neck sprain from happening again. This information is not intended to replace advice given to you by your health care provider. Make sure you discuss any questions you have with your health care provider. Document Revised: 08/21/2018 Document Reviewed: 01/11/2016 Elsevier Patient Education  2020 Elsevier Inc.  

## 2019-12-17 NOTE — Progress Notes (Signed)
Megan Bradford 69 y.o.   Chief Complaint  Patient presents with  . Neck Pain    going for about a month     HISTORY OF PRESENT ILLNESS: This is a 69 y.o. female here for follow-up of urgent care visit on 12/01/2019 when she was seen for musculoskeletal neck pain. Urgent care visit office notes reviewed as well as blood work done.  Negative Covid test. Patient has history of kidney transplant with present chronic kidney disease 3B.  Has appointment to see nephrologist next Monday. Feeling better.  Has no complaints or medical concerns.  Neck pain getting better.  HPI   Prior to Admission medications   Medication Sig Start Date End Date Taking? Authorizing Provider  amLODipine (NORVASC) 5 MG tablet Take 5 mg by mouth daily.   Yes [provider]  aspirin EC 81 MG tablet Take 81 mg by mouth daily. Swallow whole.   Yes [provider]  carvedilol (COREG) 12.5 MG tablet Take 12.5 mg by mouth 2 (two) times daily with a meal.   Yes [provider]  diclofenac Sodium (VOLTAREN) 1 % GEL Apply 2 g topically 4 (four) times daily. 12/01/19  Yes Wieters, Hallie C, [name]  magnesium oxide (MAG-OX) 400 MG tablet Take 400 mg by mouth daily.   Yes [provider]  multivitamin-lutein (OCUVITE-LUTEIN) CAPS capsule Take 1 capsule by mouth daily.   Yes [provider]  mycophenolate (MYFORTIC) 180 MG EC tablet Take 180 mg by mouth 2 (two) times daily.   Yes [provider]  pyridOXINE (VITAMIN B-6) 100 MG tablet Take 100 mg by mouth daily.   Yes [provider]  simvastatin (ZOCOR) 20 MG tablet Take 20 mg by mouth daily.   Yes [provider]  tacrolimus (PROGRAF) 1 MG capsule Take 4 mg by mouth 2 (two) times daily. 10/31/19  Yes [provider]  Tacrolimus ER 1 MG CP24 Take by mouth in the morning and at bedtime.   Yes [provider]    Allergies  Allergen Reactions  . Keflex [Cephalexin] Anaphylaxis    Patient  Active Problem List   Diagnosis Date Noted  . Stage 3b chronic kidney disease 12/17/2019  . History of kidney transplant 11/27/2019  . Essential hypertension 11/27/2019  . Dyslipidemia 11/27/2019  . Prediabetes 11/27/2019  . Body mass index (BMI) of 38.0-38.9 in adult 11/27/2019    Past Medical History:  Diagnosis Date  . Cataract    Phreesia 11/24/2019  . Chronic kidney disease    Phreesia 11/24/2019  . Hypertension    Phreesia 11/24/2019    Past Surgical History:  Procedure Laterality Date  . CESAREAN SECTION N/A    Phreesia 11/24/2019  . CHOLECYSTECTOMY N/A    Phreesia 11/24/2019  . EYE SURGERY N/A    Phreesia 11/24/2019    Social History   Socioeconomic History  . Marital status: Widowed    Spouse name: Not on file  . Number of children: Not on file  . Years of education: Not on file  . Highest education level: Not on file  Occupational History  . Not on file  Tobacco Use  . Smoking status: Never Smoker  . Smokeless tobacco: Never Used  Substance and Sexual Activity  . Alcohol use: Never  . Drug use: Never  . Sexual activity: Not Currently    Birth control/protection: None  Other Topics Concern  . Not on file  Social History Narrative  . Not on file   Social  Determinants of Health   Financial Resource Strain:   . Difficulty of Paying Living Expenses:   Food Insecurity:   . Worried About Charity fundraiser in the Last Year:   . Arboriculturist in the Last Year:   Transportation Needs:   . Film/video editor (Medical):   Marland Kitchen Lack of Transportation (Non-Medical):   Physical Activity:   . Days of Exercise per Week:   . Minutes of Exercise per Session:   Stress:   . Feeling of Stress :   Social Connections:   . Frequency of Communication with Friends and Family:   . Frequency of Social Gatherings with Friends and Family:   . Attends Religious Services:   . Active Member of Clubs or Organizations:   . Attends Archivist Meetings:    Marland Kitchen Marital Status:   Intimate Partner Violence:   . Fear of Current or Ex-Partner:   . Emotionally Abused:   Marland Kitchen Physically Abused:   . Sexually Abused:     Family History  Problem Relation Age of Onset  . Diabetes Mother   . Asthma Mother   . Hypertension Father   . Coronary artery disease Father      Review of Systems  Constitutional: Negative.  Negative for chills and fever.  HENT: Negative.  Negative for congestion and sore throat.   Respiratory: Negative.  Negative for cough and shortness of breath.   Cardiovascular: Negative.  Negative for chest pain and palpitations.  Gastrointestinal: Negative for abdominal pain, nausea and vomiting.  Genitourinary: Negative.   Musculoskeletal: Positive for neck pain.  Skin: Negative.  Negative for rash.  Neurological: Negative for dizziness, focal weakness and headaches.  All other systems reviewed and are negative.   Today's Vitals   12/17/19 0946 12/17/19 1038  BP: (!) 146/86 130/80  Pulse: 78   Temp: 97.7 F (36.5 C)   TempSrc: Temporal   SpO2: 96%   Weight: 204 lb 12.8 oz (92.9 kg)   Height: 5' (1.524 m)    Body mass index is 40 kg/m.  Physical Exam Vitals reviewed.  Constitutional:      Appearance: Normal appearance.  HENT:     Head: Normocephalic.  Eyes:     Extraocular Movements: Extraocular movements intact.     Conjunctiva/sclera: Conjunctivae normal.     Pupils: Pupils are equal, round, and reactive to light.  Cardiovascular:     Rate and Rhythm: Normal rate and regular rhythm.     Pulses: Normal pulses.     Heart sounds: Normal heart sounds.  Pulmonary:     Effort: Pulmonary effort is normal.     Breath sounds: Normal breath sounds.  Musculoskeletal:     Cervical back: No rigidity or tenderness.  Lymphadenopathy:     Cervical: No cervical adenopathy.  Skin:    General: Skin is warm and dry.  Neurological:     General: No focal deficit present.     Mental Status: She is alert and oriented to  person, place, and time.  Psychiatric:        Mood and Affect: Mood normal.        Behavior: Behavior normal.      ASSESSMENT & PLAN: Megan Bradford was seen today for neck pain.  Diagnoses and all orders for this visit:  Musculoskeletal neck pain Comments: Much improved  History of kidney transplant  Stage 3b chronic kidney disease    Patient Instructions  Cervical Sprain  A cervical sprain is  a stretch or tear in the tissues that connect bones (ligaments) in the neck. Most neck (cervical) sprains get better in 4-6 weeks. Follow these instructions at home: If you have a neck collar:  Wear it as told by your doctor. Do not take off (do not remove) the collar unless your doctor says that this is safe.  Ask your doctor before adjusting your collar.  If you have long hair, keep it outside of the collar.  Ask your doctor if you may take off the collar for cleaning and bathing. If you may take off the collar: ? Follow instructions from your doctor about how to take off the collar safely. ? Clean the collar by wiping it with mild soap and water. Let it air-dry all the way. ? If your collar has removable pads:  Take the pads out every 1-2 days.  Hand wash the pads with soap and water.  Let the pads air-dry all the way before you put them back in the collar. Do not dry them in a clothes dryer. Do not dry them with a hair dryer. ? Check your skin under the collar for irritation or sores. If you see any, tell your doctor. Managing pain, stiffness, and swelling   Use a cervical traction device, if told by your doctor.  If told, put heat on the affected area. Do this before exercises (physical therapy) or as often as told by your doctor. Use the heat source that your doctor recommends, such as a moist heat pack or a heating pad. ? Place a towel between your skin and the heat source. ? Leave the heat on for 20-30 minutes. ? Take the heat off (remove the heat) if your skin turns  bright red. This is very important if you cannot feel pain, heat, or cold. You may have a greater risk of getting burned.  Put ice on the affected area. ? Put ice in a plastic bag. ? Place a towel between your skin and the bag. ? Leave the ice on for 20 minutes, 2-3 times a day. Activity  Do not drive while wearing a neck collar. If you do not have a neck collar, ask your doctor if it is safe to drive.  Do not drive or use heavy machinery while taking prescription pain medicine or muscle relaxants, unless your doctor approves.  Do not lift anything that is heavier than 10 lb (4.5 kg) until your doctor tells you that it is safe.  Rest as told by your doctor.  Avoid activities that make you feel worse. Ask your doctor what activities are safe for you.  Do exercises as told by your doctor or physical therapist. Preventing neck sprain  Practice good posture. Adjust your workstation to help with this, if needed.  Exercise regularly as told by your doctor or physical therapist.  Avoid activities that are risky or may cause a neck sprain (cervical sprain). General instructions  Take over-the-counter and prescription medicines only as told by your doctor.  Do not use any products that contain nicotine or tobacco. This includes cigarettes and e-cigarettes. If you need help quitting, ask your doctor.  Keep all follow-up visits as told by your doctor. This is important. Contact a doctor if:  You have pain or other symptoms that get worse.  You have symptoms that do not get better after 2 weeks.  You have pain that does not get better with medicine.  You start to have new, unexplained symptoms.  You have  sores or irritated skin from wearing your neck collar. Get help right away if:  You have very bad pain.  You have any of the following in any part of your body: ? Loss of feeling (numbness). ? Tingling. ? Weakness.  You cannot move a part of your body (you have  paralysis).  Your activity level does not improve. Summary  A cervical sprain is a stretch or tear in the tissues that connect bones (ligaments) in the neck.  If you have a neck (cervical) collar, do not take off the collar unless your doctor says that this is safe.  Put ice on affected areas as told by your doctor.  Put heat on affected areas as told by your doctor.  Good posture and regular exercise can help prevent a neck sprain from happening again. This information is not intended to replace advice given to you by your health care provider. Make sure you discuss any questions you have with your health care provider. Document Revised: 08/21/2018 Document Reviewed: 01/11/2016 Elsevier Patient Education  2020 Elsevier Inc.      Agustina Caroli, MD [facility]

## 2019-12-29 ENCOUNTER — Telehealth: Payer: Self-pay | Admitting: Emergency Medicine

## 2019-12-29 DIAGNOSIS — R7303 Prediabetes: Secondary | ICD-10-CM | POA: Diagnosis not present

## 2019-12-29 DIAGNOSIS — N1832 Chronic kidney disease, stage 3b: Secondary | ICD-10-CM | POA: Diagnosis not present

## 2019-12-29 DIAGNOSIS — Z940 Kidney transplant status: Secondary | ICD-10-CM | POA: Diagnosis not present

## 2019-12-29 DIAGNOSIS — I129 Hypertensive chronic kidney disease with stage 1 through stage 4 chronic kidney disease, or unspecified chronic kidney disease: Secondary | ICD-10-CM | POA: Diagnosis not present

## 2019-12-29 DIAGNOSIS — E785 Hyperlipidemia, unspecified: Secondary | ICD-10-CM | POA: Diagnosis not present

## 2019-12-29 NOTE — Telephone Encounter (Signed)
Yes

## 2019-12-29 NOTE — Telephone Encounter (Signed)
Rx has to be paper script has been completed and placed in nurse station box for return call from pt. She will call us to give [place] the name and number to where she wants [place] to send the rx to.

## 2019-12-29 NOTE — Telephone Encounter (Signed)
I have called the pt back and informed her that we do not have any neck collar here but if she wants to let us know where she would like for [place] to send it we can write a script for it.   Pt stated understanding and will give [place] a call tomorrow with the place to send it.   Dr. Is it okay to write a DME order for the neck collar for pt?

## 2019-12-29 NOTE — Telephone Encounter (Signed)
Pt called regarding the neck collar that she talked about with provider on 12/17/19 appt. Gave information to [place] and she stated she will call pt back later about it. Please advise.

## 2019-12-31 ENCOUNTER — Other Ambulatory Visit: Payer: Self-pay | Admitting: Nephrology

## 2019-12-31 DIAGNOSIS — Z940 Kidney transplant status: Secondary | ICD-10-CM

## 2020-01-07 ENCOUNTER — Ambulatory Visit (HOSPITAL_COMMUNITY)
Admission: RE | Admit: 2020-01-07 | Discharge: 2020-01-07 | Disposition: A | Discharge status: Home / Self Care | Payer: Medicare Other | Source: Ambulatory Visit | Attending: Nephrology | Admitting: Nephrology

## 2020-01-07 DIAGNOSIS — T8619 Other complication of kidney transplant: Secondary | ICD-10-CM | POA: Diagnosis not present

## 2020-01-07 DIAGNOSIS — N189 Chronic kidney disease, unspecified: Secondary | ICD-10-CM | POA: Diagnosis not present

## 2020-01-07 DIAGNOSIS — M549 Dorsalgia, unspecified: Secondary | ICD-10-CM | POA: Diagnosis not present

## 2020-01-07 DIAGNOSIS — Z940 Kidney transplant status: Secondary | ICD-10-CM | POA: Diagnosis not present

## 2020-01-07 DIAGNOSIS — N1330 Unspecified hydronephrosis: Secondary | ICD-10-CM | POA: Diagnosis not present

## 2020-02-02 ENCOUNTER — Ambulatory Visit (HOSPITAL_COMMUNITY)
Admission: EM | Admit: 2020-02-02 | Discharge: 2020-02-02 | Disposition: A | Discharge status: Home / Self Care | Payer: Medicare Other | Attending: Internal Medicine | Admitting: Internal Medicine

## 2020-02-02 ENCOUNTER — Other Ambulatory Visit: Payer: Self-pay

## 2020-02-02 DIAGNOSIS — Z940 Kidney transplant status: Secondary | ICD-10-CM | POA: Diagnosis not present

## 2020-02-02 NOTE — ED Notes (Signed)
Called pt, no answer.

## 2020-02-02 NOTE — ED Notes (Signed)
Called pt, pt was not in [facility] parking lot.

## 2020-02-23 ENCOUNTER — Other Ambulatory Visit: Payer: Self-pay | Admitting: Emergency Medicine

## 2020-02-23 ENCOUNTER — Ambulatory Visit: Payer: Medicare Other | Admitting: Emergency Medicine

## 2020-02-23 MED ORDER — SIMVASTATIN 20 MG PO TABS: 20.0000 mg | ORAL_TABLET | Freq: Every day | ORAL | 1 refills | Status: DC

## 2020-02-23 NOTE — Telephone Encounter (Signed)
Medication Refill - Medication: simvastatin (ZOCOR) 20 MG tablet [[id number]]   Order Details Dose: 20 mg Route: Oral Frequency: Daily  Dispense Quantity: -- Refills: --       Preferred Pharmacy (with phone number or street name): [facility] [place], [address]  [place] [address]  Phone: [phone] Fax: [phone]  Hours: Not open 24 hours     Agent: Please be advised that RX refills may take up to 3 business days. We ask that you follow-up with your pharmacy.

## 2020-02-23 NOTE — Telephone Encounter (Signed)
Requested medication (s) are due for refill today: unsure  Requested medication (s) are on the active medication list:yes  Last refill: historic med  Future visit scheduled: No  Notes to clinic: Historic med Historic provider    Requested Prescriptions  Pending Prescriptions Disp Refills   simvastatin (ZOCOR) 20 MG tablet 30 tablet     Sig: Take 1 tablet (20 mg total) by mouth daily.      Cardiovascular:  Antilipid - Statins Failed - 02/23/2020  3:08 PM      Failed - Total Cholesterol in normal range and within 360 days    Cholesterol, Total  Date Value Ref Range Status  11/27/2019 203 (H) 100 - 199 mg/dL Final          Failed - LDL in normal range and within 360 days    LDL Chol Calc (NIH)  Date Value Ref Range Status  11/27/2019 116 (H) 0 - 99 mg/dL Final          Passed - HDL in normal range and within 360 days    HDL  Date Value Ref Range Status  11/27/2019 73 >39 mg/dL Final          Passed - Triglycerides in normal range and within 360 days    Triglycerides  Date Value Ref Range Status  11/27/2019 81 0 - 149 mg/dL Final          Passed - Patient is not pregnant      Passed - Valid encounter within last 12 months    Recent Outpatient Visits           2 months ago Musculoskeletal neck pain   Primary Care at [facility], Ines Bloomer, MD   2 months ago Encounter to establish care   Primary Care at [facility], [place], [place]

## 2020-03-01 ENCOUNTER — Ambulatory Visit (INDEPENDENT_AMBULATORY_CARE_PROVIDER_SITE_OTHER): Payer: Medicare Other | Admitting: Emergency Medicine

## 2020-03-01 ENCOUNTER — Encounter: Payer: Self-pay | Admitting: Emergency Medicine

## 2020-03-01 ENCOUNTER — Other Ambulatory Visit: Payer: Self-pay

## 2020-03-01 VITALS — BP 132/70 | HR 83 | Temp 97.5°F | Ht 60.0 in | Wt 206.8 lb

## 2020-03-01 DIAGNOSIS — R7303 Prediabetes: Secondary | ICD-10-CM

## 2020-03-01 DIAGNOSIS — I10 Essential (primary) hypertension: Secondary | ICD-10-CM

## 2020-03-01 DIAGNOSIS — N1832 Chronic kidney disease, stage 3b: Secondary | ICD-10-CM | POA: Diagnosis not present

## 2020-03-01 DIAGNOSIS — Z23 Encounter for immunization: Secondary | ICD-10-CM | POA: Diagnosis not present

## 2020-03-01 DIAGNOSIS — R002 Palpitations: Secondary | ICD-10-CM

## 2020-03-01 DIAGNOSIS — Z940 Kidney transplant status: Secondary | ICD-10-CM | POA: Diagnosis not present

## 2020-03-01 DIAGNOSIS — E785 Hyperlipidemia, unspecified: Secondary | ICD-10-CM

## 2020-03-01 DIAGNOSIS — Z8709 Personal history of other diseases of the respiratory system: Secondary | ICD-10-CM

## 2020-03-01 MED ORDER — TACROLIMUS 1 MG PO CAPS: 4.0000 mg | ORAL_CAPSULE | Freq: Two times a day (BID) | ORAL | 1 refills | Status: DC

## 2020-03-01 MED ORDER — SIMVASTATIN 20 MG PO TABS: 20.0000 mg | ORAL_TABLET | Freq: Every day | ORAL | 3 refills | Status: DC

## 2020-03-01 NOTE — Progress Notes (Signed)
Megan Bradford 69 y.o.   Chief Complaint  Patient presents with  . Medical Management of Chronic Issues    f/u     HISTORY OF PRESENT ILLNESS: This is a 69 y.o. female with multiple chronic medical problems here for follow-up. 1.  History of kidney transplant.  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  Recently established care with a local nephrologist. Moved from [facility] about 7 months ago.  On tacrolimus twice a day. 2.  History of hypertension.  On amlodipine 5 mg daily and carvedilol 12.5 mg twice daily 3.  History of prediabetes 4.  Dyslipidemia.  On simvastatin 20 mg daily. Requesting referrals to pulmonary, cardiology, and hematology. States she had with pulmonary doctors thought about sarcoidosis perhaps interstitial lung disease. Also complaining of palpitations on exerting.  Like her heart is "fluttering".  Requesting cardiology referral. Once told she has "something in my blood" and had been evaluated by hematologist with chronic follow-up.  Unable to name diagnosis. No other complaints or medical concerns today.  HPI   Prior to Admission medications   Medication Sig Start Date End Date Taking? Authorizing Provider  amLODipine (NORVASC) 5 MG tablet Take 5 mg by mouth daily.   Yes [provider]  aspirin EC 81 MG tablet Take 81 mg by mouth daily. Swallow whole.   Yes [provider]  carvedilol (COREG) 12.5 MG tablet Take 12.5 mg by mouth 2 (two) times daily with a meal.   Yes [provider]  Cholecalciferol (VITAMIN D3) 10 MCG (400 UNIT) CAPS Take by mouth.   Yes [provider]  diclofenac Sodium (VOLTAREN) 1 % GEL Apply 2 g topically 4 (four) times daily. 12/01/19  Yes Wieters, Hallie C, [name]  magnesium oxide (MAG-OX) 400 MG tablet Take 400 mg by mouth daily.   Yes [provider]  multivitamin-lutein (OCUVITE-LUTEIN) CAPS capsule Take 1 capsule by mouth daily.   Yes [provider]  mycophenolate  (MYFORTIC) 180 MG EC tablet Take 180 mg by mouth 2 (two) times daily.   Yes [provider]  pyridOXINE (VITAMIN B-6) 100 MG tablet Take 100 mg by mouth daily.   Yes [provider]  simvastatin (ZOCOR) 20 MG tablet Take 1 tablet (20 mg total) by mouth daily. 02/23/20  Yes Audel Coakley, Ines Bloomer, MD  tacrolimus (PROGRAF) 1 MG capsule Take 4 mg by mouth 2 (two) times daily. 10/31/19  Yes [provider]  Tacrolimus ER 1 MG CP24 Take by mouth in the morning and at bedtime. Patient not taking: Reported on 03/01/2020    [provider]    Allergies  Allergen Reactions  . Keflex [Cephalexin] Anaphylaxis    Patient Active Problem List   Diagnosis Date Noted  . Stage 3b chronic kidney disease ([place]) 12/17/2019  . History of kidney transplant 11/27/2019  . Essential hypertension 11/27/2019  . Dyslipidemia 11/27/2019  . Prediabetes 11/27/2019  . Body mass index (BMI) of 38.0-38.9 in adult 11/27/2019    Past Medical History:  Diagnosis Date  . Cataract    Phreesia 11/24/2019  . Chronic kidney disease    Phreesia 11/24/2019  . Hypertension    Phreesia 11/24/2019    Past Surgical History:  Procedure Laterality Date  . CESAREAN SECTION N/A    Phreesia 11/24/2019  . CHOLECYSTECTOMY N/A    [employer]reesia 11/24/2019  . EYE SURGERY N/A    Phreesia 11/24/2019    Social History   Socioeconomic History  . Marital status: Widowed  Spouse name: Not on file  . Number of children: Not on file  . Years of education: Not on file  . Highest education level: Not on file  Occupational History  . Not on file  Tobacco Use  . Smoking status: Never Smoker  . Smokeless tobacco: Never Used  Substance and Sexual Activity  . Alcohol use: Never  . Drug use: Never  . Sexual activity: Not Currently    Birth control/protection: None  Other Topics Concern  . Not on file  Social History Narrative  . Not on file   Social Determinants of Health   Financial Resource  Strain:   . Difficulty of Paying Living Expenses: Not on file  Food Insecurity:   . Worried About Charity fundraiser in the Last Year: Not on file  . Ran Out of Food in the Last Year: Not on file  Transportation Needs:   . Lack of Transportation (Medical): Not on file  . Lack of Transportation (Non-Medical): Not on file  Physical Activity:   . Days of Exercise per Week: Not on file  . Minutes of Exercise per Session: Not on file  Stress:   . Feeling of Stress : Not on file  Social Connections:   . Frequency of Communication with Friends and Family: Not on file  . Frequency of Social Gatherings with Friends and Family: Not on file  . Attends Religious Services: Not on file  . Active Member of Clubs or Organizations: Not on file  . Attends Archivist Meetings: Not on file  . Marital Status: Not on file  Intimate Partner Violence:   . Fear of Current or Ex-Partner: Not on file  . Emotionally Abused: Not on file  . Physically Abused: Not on file  . Sexually Abused: Not on file    Family History  Problem Relation Age of Onset  . Diabetes Mother   . Asthma Mother   . Hypertension Father   . Coronary artery disease Father      Review of Systems  Constitutional: Negative.  Negative for chills and fever.  HENT: Negative.  Negative for congestion and sore throat.   Respiratory: Negative.  Negative for cough and shortness of breath.   Cardiovascular: Negative.  Negative for chest pain and palpitations.  Gastrointestinal: Negative for abdominal pain, diarrhea, nausea and vomiting.  Musculoskeletal: Negative.  Negative for joint pain and myalgias.  Skin: Negative.  Negative for rash.  Neurological: Negative for dizziness and headaches.  All other systems reviewed and are negative.    Today's Vitals   03/01/20 1040 03/01/20 1054  BP: (!) 143/84 132/70  Pulse: 83   Temp: (!) 97.5 F (36.4 C)   TempSrc: Temporal   SpO2: 98%   Weight: 206 lb 12.8 oz (93.8 kg)    Height: 5' (1.524 m)    Body mass index is 40.39 kg/m.   Physical Exam Vitals reviewed.  Constitutional:      Appearance: Normal appearance.  HENT:     Head: Normocephalic.  Eyes:     Extraocular Movements: Extraocular movements intact.     Conjunctiva/sclera: Conjunctivae normal.     Pupils: Pupils are equal, round, and reactive to light.  Cardiovascular:     Rate and Rhythm: Normal rate and regular rhythm.     Pulses: Normal pulses.     Heart sounds: Normal heart sounds.  Pulmonary:     Effort: Pulmonary effort is normal.     Breath sounds: Normal breath sounds.  Musculoskeletal:        General: Normal range of motion.     Cervical back: Normal range of motion and neck supple.  Skin:    General: Skin is warm and dry.     Capillary Refill: Capillary refill takes less than 2 seconds.  Neurological:     General: No focal deficit present.     Mental Status: She is alert and oriented to person, place, and time.  Psychiatric:        Mood and Affect: Mood normal.        Behavior: Behavior normal.      ASSESSMENT & PLAN: Clinically stable.  No medical concerns identified during this visit. Continue present medications.  No changes. Debraann was seen today for medical management of chronic issues.  Diagnoses and all orders for this visit:  Essential hypertension  History of kidney transplant -     tacrolimus (PROGRAF) 1 MG capsule; Take 4 capsules (4 mg total) by mouth 2 (two) times daily.  Stage 3b chronic kidney disease ([facility])  Dyslipidemia -     simvastatin (ZOCOR) 20 MG tablet; Take 1 tablet (20 mg total) by mouth daily.  Prediabetes  Need for prophylactic vaccination and inoculation against influenza -     Flu Vaccine QUAD High Dose(Fluad)  Need for vaccination against Streptococcus pneumoniae using pneumococcal conjugate vaccine 13 -     Pneumococcal conjugate vaccine 13-valent IM  Palpitations -     Ambulatory referral to Cardiology  History of  interstitial lung disease -     Ambulatory referral to Pulmonology    Patient Instructions       If you have lab work done today you will be contacted with your lab results within the next 2 weeks.  If you have not heard from [place] then please contact us. The fastest way to get your results is to register for My Chart.   IF you received an x-ray today, you will receive an invoice from [facility]. Please contact [facility] at [phone] with questions or concerns regarding your invoice.   IF you received labwork today, you will receive an invoice from [place]. Please contact [facility] at [phone] with questions or concerns regarding your invoice.   Our billing staff will not be able to assist you with questions regarding bills from these companies.  You will be contacted with the lab results as soon as they are available. The fastest way to get your results is to activate your My Chart account. Instructions are located on the last page of this paperwork. If you have not heard from [place] regarding the results in 2 weeks, please contact this office.      Health Maintenance After Age 20 After age 80, you are at a higher risk for certain long-term diseases and infections as well as injuries from falls. Falls are a major cause of broken bones and head injuries in people who are older than age 53. Getting regular preventive care can help to keep you healthy and well. Preventive care includes getting regular testing and making lifestyle changes as recommended by your health care provider. Talk with your health care provider about:  Which screenings and tests you should have. A screening is a test that checks for a disease when you have no symptoms.  A diet and exercise plan that is right for you. What should I know about screenings and tests to prevent falls? Screening and testing are the best ways to find a health problem  early. Early diagnosis and treatment give you the  best chance of managing medical conditions that are common after age 22. Certain conditions and lifestyle choices may make you more likely to have a fall. Your health care provider may recommend:  Regular vision checks. Poor vision and conditions such as cataracts can make you more likely to have a fall. If you wear glasses, make sure to get your prescription updated if your vision changes.  Medicine review. Work with your health care provider to regularly review all of the medicines you are taking, including over-the-counter medicines. Ask your health care provider about any side effects that may make you more likely to have a fall. Tell your health care provider if any medicines that you take make you feel dizzy or sleepy.  Osteoporosis screening. Osteoporosis is a condition that causes the bones to get weaker. This can make the bones weak and cause them to break more easily.  Blood pressure screening. Blood pressure changes and medicines to control blood pressure can make you feel dizzy.  Strength and balance checks. Your health care provider may recommend certain tests to check your strength and balance while standing, walking, or changing positions.  Foot health exam. Foot pain and numbness, as well as not wearing proper footwear, can make you more likely to have a fall.  Depression screening. You may be more likely to have a fall if you have a fear of falling, feel emotionally low, or feel unable to do activities that you used to do.  Alcohol use screening. Using too much alcohol can affect your balance and may make you more likely to have a fall. What actions can I take to lower my risk of falls? General instructions  Talk with your health care provider about your risks for falling. Tell your health care provider if: ? You fall. Be sure to tell your health care provider about all falls, even ones that seem minor. ? You feel dizzy, sleepy, or off-balance.  Take over-the-counter and  prescription medicines only as told by your health care provider. These include any supplements.  Eat a healthy diet and maintain a healthy weight. A healthy diet includes low-fat dairy products, low-fat (lean) meats, and fiber from whole grains, beans, and lots of fruits and vegetables. Home safety  Remove any tripping hazards, such as rugs, cords, and clutter.  Install safety equipment such as grab bars in bathrooms and safety rails on stairs.  Keep rooms and walkways well-lit. Activity   Follow a regular exercise program to stay fit. This will help you maintain your balance. Ask your health care provider what types of exercise are appropriate for you.  If you need a cane or walker, use it as recommended by your health care provider.  Wear supportive shoes that have nonskid soles. Lifestyle  Do not drink alcohol if your health care provider tells you not to drink.  If you drink alcohol, limit how much you have: ? 0-1 drink a day for women. ? 0-2 drinks a day for men.  Be aware of how much alcohol is in your drink. In the [place] one drink equals one typical bottle of beer (12 oz), one-half glass of wine (5 oz), or one shot of hard liquor (1 oz).  Do not use any products that contain nicotine or tobacco, such as cigarettes and e-cigarettes. If you need help quitting, ask your health care provider. Summary  Having a healthy lifestyle and getting preventive care can help to protect your  health and wellness after age 68.  Screening and testing are the best way to find a health problem early and help you avoid having a fall. Early diagnosis and treatment give you the best chance for managing medical conditions that are more common for people who are older than age 41.  Falls are a major cause of broken bones and head injuries in people who are older than age 30. Take precautions to prevent a fall at home.  Work with your health care provider to learn what changes you can make to  improve your health and wellness and to prevent falls. This information is not intended to replace advice given to you by your health care provider. Make sure you discuss any questions you have with your health care provider. Document Revised: 08/22/2018 Document Reviewed: 03/14/2017 Elsevier Patient Education  2020 Elsevier Inc.      Agustina Caroli, MD [facility]

## 2020-03-01 NOTE — Patient Instructions (Addendum)
   If you have lab work done today you will be contacted with your lab results within the next 2 weeks.  If you have not heard from us then please contact us. The fastest way to get your results is to register for My Chart.   IF you received an x-ray today, you will receive an invoice from [facility]. Please contact [facility] at [phone] with questions or concerns regarding your invoice.   IF you received labwork today, you will receive an invoice from [facility]. Please contact [facility] at [phone] with questions or concerns regarding your invoice.   Our billing staff will not be able to assist you with questions regarding bills from these companies.  You will be contacted with the lab results as soon as they are available. The fastest way to get your results is to activate your My Chart account. Instructions are located on the last page of this paperwork. If you have not heard from us regarding the results in 2 weeks, please contact this office.     Health Maintenance After Age 65 After age 65, you are at a higher risk for certain long-term diseases and infections as well as injuries from falls. Falls are a major cause of broken bones and head injuries in people who are older than age 65. Getting regular preventive care can help to keep you healthy and well. Preventive care includes getting regular testing and making lifestyle changes as recommended by your health care provider. Talk with your health care provider about:  Which screenings and tests you should have. A screening is a test that checks for a disease when you have no symptoms.  A diet and exercise plan that is right for you. What should I know about screenings and tests to prevent falls? Screening and testing are the best ways to find a health problem early. Early diagnosis and treatment give you the best chance of managing medical conditions that are common after age 65. Certain conditions and  lifestyle choices may make you more likely to have a fall. Your health care provider may recommend:  Regular vision checks. Poor vision and conditions such as cataracts can make you more likely to have a fall. If you wear glasses, make sure to get your prescription updated if your vision changes.  Medicine review. Work with your health care provider to regularly review all of the medicines you are taking, including over-the-counter medicines. Ask your health care provider about any side effects that may make you more likely to have a fall. Tell your health care provider if any medicines that you take make you feel dizzy or sleepy.  Osteoporosis screening. Osteoporosis is a condition that causes the bones to get weaker. This can make the bones weak and cause them to break more easily.  Blood pressure screening. Blood pressure changes and medicines to control blood pressure can make you feel dizzy.  Strength and balance checks. Your health care provider may recommend certain tests to check your strength and balance while standing, walking, or changing positions.  Foot health exam. Foot pain and numbness, as well as not wearing proper footwear, can make you more likely to have a fall.  Depression screening. You may be more likely to have a fall if you have a fear of falling, feel emotionally low, or feel unable to do activities that you used to do.  Alcohol use screening. Using too much alcohol can affect your balance and may make you more likely to   have a fall. What actions can I take to lower my risk of falls? General instructions  Talk with your health care provider about your risks for falling. Tell your health care provider if: ? You fall. Be sure to tell your health care provider about all falls, even ones that seem minor. ? You feel dizzy, sleepy, or off-balance.  Take over-the-counter and prescription medicines only as told by your health care provider. These include any  supplements.  Eat a healthy diet and maintain a healthy weight. A healthy diet includes low-fat dairy products, low-fat (lean) meats, and fiber from whole grains, beans, and lots of fruits and vegetables. Home safety  Remove any tripping hazards, such as rugs, cords, and clutter.  Install safety equipment such as grab bars in bathrooms and safety rails on stairs.  Keep rooms and walkways well-lit. Activity   Follow a regular exercise program to stay fit. This will help you maintain your balance. Ask your health care provider what types of exercise are appropriate for you.  If you need a cane or walker, use it as recommended by your health care provider.  Wear supportive shoes that have nonskid soles. Lifestyle  Do not drink alcohol if your health care provider tells you not to drink.  If you drink alcohol, limit how much you have: ? 0-1 drink a day for women. ? 0-2 drinks a day for men.  Be aware of how much alcohol is in your drink. In the [place] one drink equals one typical bottle of beer (12 oz), one-half glass of wine (5 oz), or one shot of hard liquor (1 oz).  Do not use any products that contain nicotine or tobacco, such as cigarettes and e-cigarettes. If you need help quitting, ask your health care provider. Summary  Having a healthy lifestyle and getting preventive care can help to protect your health and wellness after age 65.  Screening and testing are the best way to find a health problem early and help you avoid having a fall. Early diagnosis and treatment give you the best chance for managing medical conditions that are more common for people who are older than age 65.  Falls are a major cause of broken bones and head injuries in people who are older than age 65. Take precautions to prevent a fall at home.  Work with your health care provider to learn what changes you can make to improve your health and wellness and to prevent falls. This information is not intended  to replace advice given to you by your health care provider. Make sure you discuss any questions you have with your health care provider. Document Revised: 08/22/2018 Document Reviewed: 03/14/2017 Elsevier Patient Education  2020 Elsevier Inc.  

## 2020-03-02 ENCOUNTER — Telehealth: Payer: Self-pay | Admitting: *Deleted

## 2020-03-02 NOTE — Telephone Encounter (Signed)
Schedule AWV.  

## 2020-03-14 NOTE — Progress Notes (Signed)
Cardiology Office Note:    Date:  03/17/2020   ID:  Megan Bradford, DOB 08-18-1950, MRN [id number]  PCP:  Horald Pollen, MD  [place] Cardiologist:  No primary care provider on file.  [facility] Electrophysiologist:  None   Referring MD: Horald Pollen, *    History of Present Illness:    Megan Bradford is a 69 y.o. female with a hx of HTN, ESRD s/p renal transplant (2016) with CKD stage IIIb, and HLD who was referred by Dr. Mitchel Honour for the evaluation of palpitations.  Patient has been having the sensation of "fluttering" in her chest that has been ongoing for 4-5 months. Each episode lasts about 10 minutes. No known triggers and can occur at rest. Does not monitor pulse at home. Has associated SOB. No dizziness, lightheadedness, syncope, or chest pain. Goes away with relaxing and taking deep breaths. No known personal history of CAD. Had cath in 2008 and 2009 in [place] which were reportedly normal. Has seen Cardiologist in the past for this as she was told she may have a "electrical issue on the right side of heart."   Patient also states that she has some exercise limitations. Specifically, she has to stop when walking 1 flight of stairs due to shortness of breath. Also notes some SOB and diaphoresis when walking up an incline. No issues on flat ground. No nausea, vomiting, abdominal pain. Had thorough cardiac work-up prior to renal transplant in 2016 [facility]) which was reportedly normal.  Lastly, the patient is concerned about bilateral LE "achiness" that is worse with walkin. Has noticed that she needs to stop walking due to achiness. Pain is not worsened or relieved with positioning. Had similar sensation when sitting on the couch and dangling her legs off that improved with rubbing her legs. No cool extremities or LE wounds. Pain is slightly worse in right then in the left.   Blood pressure at home is usually 120s/80s.   TC 203, HDL 73, LDL 116, TG  81  Family history: Dad massive MI at 53 and CVA, cousin with ACB, brother with MI at age 33, mother with diabetes  Past Medical History:  Diagnosis Date  . Cataract    Phreesia 11/24/2019  . Chronic kidney disease    Phreesia 11/24/2019  . Hypertension    Phreesia 11/24/2019    Past Surgical History:  Procedure Laterality Date  . CESAREAN SECTION N/A    Phreesia 11/24/2019  . CHOLECYSTECTOMY N/A    Phreesia 11/24/2019  . EYE SURGERY N/A    Phreesia 11/24/2019    Current Medications: Current Meds  Medication Sig  . amLODipine (NORVASC) 5 MG tablet Take 5 mg by mouth daily.  Marland Kitchen aspirin EC 81 MG tablet Take 81 mg by mouth daily. Swallow whole.  . carvedilol (COREG) 12.5 MG tablet Take 12.5 mg by mouth 2 (two) times daily with a meal.  . Cholecalciferol (VITAMIN D3) 10 MCG (400 UNIT) CAPS Take by mouth.  . diclofenac Sodium (VOLTAREN) 1 % GEL Apply 2 g topically 4 (four) times daily.  . magnesium oxide (MAG-OX) 400 MG tablet Take 400 mg by mouth daily.  . multivitamin-lutein (OCUVITE-LUTEIN) CAPS capsule Take 1 capsule by mouth daily.  . mycophenolate (MYFORTIC) 180 MG EC tablet Take 180 mg by mouth 2 (two) times daily.  Marland Kitchen pyridOXINE (VITAMIN B-6) 100 MG tablet Take 100 mg by mouth daily.  . tacrolimus (PROGRAF) 1 MG capsule Take 4 capsules (4 mg total) by mouth 2 (two)  times daily.  . [DISCONTINUED] simvastatin (ZOCOR) 20 MG tablet Take 1 tablet (20 mg total) by mouth daily.     Allergies:   Keflex [cephalexin]   Social History   Socioeconomic History  . Marital status: Widowed    Spouse name: Not on file  . Number of children: Not on file  . Years of education: Not on file  . Highest education level: Not on file  Occupational History  . Not on file  Tobacco Use  . Smoking status: Never Smoker  . Smokeless tobacco: Never Used  Substance and Sexual Activity  . Alcohol use: Never  . Drug use: Never  . Sexual activity: Not Currently    Birth control/protection:  None  Other Topics Concern  . Not on file  Social History Narrative  . Not on file   Social Determinants of Health   Financial Resource Strain:   . Difficulty of Paying Living Expenses: Not on file  Food Insecurity:   . Worried About Charity fundraiser in the Last Year: Not on file  . Ran Out of Food in the Last Year: Not on file  Transportation Needs:   . Lack of Transportation (Medical): Not on file  . Lack of Transportation (Non-Medical): Not on file  Physical Activity:   . Days of Exercise per Week: Not on file  . Minutes of Exercise per Session: Not on file  Stress:   . Feeling of Stress : Not on file  Social Connections:   . Frequency of Communication with Friends and Family: Not on file  . Frequency of Social Gatherings with Friends and Family: Not on file  . Attends Religious Services: Not on file  . Active Member of Clubs or Organizations: Not on file  . Attends Archivist Meetings: Not on file  . Marital Status: Not on file     Family History: The patient's family history includes Asthma in her mother; Coronary artery disease in her father; Diabetes in her mother; Hypertension in her father.  ROS:   Please see the history of present illness.    Review of Systems  Constitutional: Negative for chills, fever and weight loss.  HENT: Negative for hearing loss.   Eyes: Negative for blurred vision.  Respiratory: Negative for cough.   Cardiovascular: Positive for palpitations and claudication. Negative for chest pain, orthopnea, leg swelling and PND.  Gastrointestinal: Negative for abdominal pain, nausea and vomiting.  Genitourinary: Negative for dysuria and hematuria.  Musculoskeletal: Positive for myalgias. Negative for falls.  Neurological: Negative for dizziness and loss of consciousness.  Psychiatric/Behavioral: Negative for substance abuse.    EKGs/Labs/Other Studies Reviewed:    The following studies were reviewed today: No prior cardiac studies  in our system  EKG:  EKG is  ordered today.  The ekg ordered today demonstrates NSR with LAD. HR 71.  Recent Labs: 11/27/2019: ALT 7; BUN 20; Creatinine, Ser 1.71; Hemoglobin 13.6; Platelets 263; Potassium 4.0; Sodium 146  Recent Lipid Panel    Component Value Date/Time   CHOL 203 (H) 11/27/2019 1019   TRIG 81 11/27/2019 1019   HDL 73 11/27/2019 1019   CHOLHDL 2.8 11/27/2019 1019   LDLCALC 116 (H) 11/27/2019 1019     Risk Assessment/Calculations:       Physical Exam:    VS:  BP 140/90   Pulse 71   Ht 5' (1.524 m)   Wt 207 lb (93.9 kg)   SpO2 97%   BMI 40.43 kg/m  Wt Readings from Last 3 Encounters:  03/17/20 207 lb (93.9 kg)  03/01/20 206 lb 12.8 oz (93.8 kg)  12/17/19 204 lb 12.8 oz (92.9 kg)     GEN:  Well nourished, well developed in no acute distress HEENT: Normal NECK: No JVD; No carotid bruits CARDIAC: RRR, no murmurs, rubs, gallops RESPIRATORY:  Clear to auscultation without rales, wheezing or rhonchi  ABDOMEN: Soft, non-tender, non-distended MUSCULOSKELETAL:  Warm. 2+DP on left and 1+ DP on right. 1+ PT on left and right. Feet are warm to the touch. No wounds. SKIN: Warm and dry NEUROLOGIC:  Alert and oriented x 3 PSYCHIATRIC:  Normal affect   ASSESSMENT:    1. SOB (shortness of breath)   2. Palpitations   3. Claudication in peripheral vascular disease ([place])   4. SOB (shortness of breath) on exertion   5. Chest pain of uncertain etiology    PLAN:    In order of problems listed above:  #Palpitations: Has intermittent palpitations that have been ongoing for the past 4-5 months. Specifically, has the sensation "her heart is pounding" with associated shortness of breath. No chest pain, dizziness, lightheadedness or syncope. ECG with sinus rhythm with no ectopy. Will place monitor to assess further.  -Zio patch monitor x 14days  #DOE: Patient reports difficulty walking up a flight of stairs or up an incline due to shortness of breath. Had negative  cath in 2008 and 2009 and reportedly normal stress testing in 2016. Has very strong family history of CAD as well as multiple risk factors including HTN, HLD, and CKD. Concern for anginal equivalent. -Will check myoview--patient wants to discuss with nephrolosit before proceeding -Check TTE  #Concern for claudication: Patient notes worsening LE achiness with walking that is relieved with rest. Symptoms are bilateral but worse in the right leg. Pulses palpable but slightly diminished on right. Given exercise induced symptoms, will check ABIs. -Check bilateral ABI given concern for claudication -Continue ASA and statin  #ESRD s/p renal transplant with CKD stage IIIB: -Follow-uo Nephrology -On tacrolimus  #HTN: Well controlled at home. -Continue home amlodipine 5mg  -Continue coreg 12.5mg  BID  #HLD: Given risk factors, goal LDL<100.  -Change simvstatin 20mg  to crestor 20mg  daily -Repeat cholesterol panel with PCP  Medication Adjustments/Labs and Tests Ordered: Current medicines are reviewed at length with the patient today.  Concerns regarding medicines are outlined above.  Orders Placed This Encounter  Procedures  . LONG TERM MONITOR (3-14 DAYS)  . MYOCARDIAL PERFUSION IMAGING  . EKG 12-Lead  . ECHOCARDIOGRAM COMPLETE  . VAS [place] LOWER EXTREMITY ARTERIAL DUPLEX  . VAS [place] ABI WITH/WO TBI   Meds ordered this encounter  Medications  . rosuvastatin (CRESTOR) 20 MG tablet    Sig: Take 1 tablet (20 mg total) by mouth daily.    Dispense:  90 tablet    Refill:  3    Patient Instructions  Medication Instructions:  Your physician has recommended you make the following change in your medication:  1-STOP Simvastatin 2-START Crestor 20 mg by mouth daily.  *If you need a refill on your cardiac medications before your next appointment, please call your pharmacy*  Lab Work: If you have labs (blood work) drawn today and your tests are completely normal, you will receive your results only  by: Marland Kitchen MyChart Message (if you have MyChart) OR . A paper copy in the mail If you have any lab test that is abnormal or we need to change your treatment, we will call you to  review the results.  Testing/Procedures: Your physician has requested that you have an echocardiogram. Echocardiography is a painless test that uses sound waves to create images of your heart. It provides your doctor with information about the size and shape of your heart and how well your heart's chambers and valves are working. This procedure takes approximately one hour. There are no restrictions for this procedure.  Your physician has requested that you have a lower extremity arterial duplex with ABI's. This test is an ultrasound of the arteries in the legs. It looks at arterial blood flow in the legs. Allow one hour for Lower Arterial scans. There are no restrictions or special instructions.   ZIO XT- Long Term Monitor Instructions   Your physician has requested you wear your ZIO patch monitor___14____days.   This is a single patch monitor.  [employer] supplies one patch monitor per enrollment.  Additional stickers are not available.   Please do not apply patch if you will be having a Nuclear Stress Test, Echocardiogram, Cardiac CT, MRI, or Chest Xray during the time frame you would be wearing the monitor. The patch cannot be worn during these tests.  You cannot remove and re-apply the ZIO XT patch monitor.   Your ZIO patch monitor will be sent USPS Priority mail from [facility] directly to your home address. The monitor may also be mailed to a PO BOX if home delivery is not available.   It may take 3-5 days to receive your monitor after you have been enrolled.   Once you have received you monitor, please review enclosed instructions.  Your monitor has already been registered assigning a specific monitor serial # to you.   Applying the monitor   Shave hair from upper left chest.   Hold abrader disc by orange  tab.  Rub abrader in 40 strokes over left upper chest as indicated in your monitor instructions.   Clean area with 4 enclosed alcohol pads .  Use all pads to assure are is cleaned thoroughly.  Let dry.   Apply patch as indicated in monitor instructions.  Patch will be place under collarbone on left side of chest with arrow pointing upward.   Rub patch adhesive wings for 2 minutes.Remove white label marked "1".  Remove white label marked "2".  Rub patch adhesive wings for 2 additional minutes.   While looking in a mirror, press and release button in center of patch.  A small green light will flash 3-4 times .  This will be your only indicator the monitor has been turned on.     Do not shower for the first 24 hours.  You may shower after the first 24 hours.   Press button if you feel a symptom. You will hear a small click.  Record Date, Time and Symptom in the Patient Log Book.   When you are ready to remove patch, follow instructions on last 2 pages of Patient Log Book.  Stick patch monitor onto last page of Patient Log Book.   Place Patient Log Book in [place] box.  Use locking tab on box and tape box closed securely.  The [employer] and [employer] has [employer] on it.  Please place in mailbox as soon as possible.  Your physician should have your test results approximately 7 days after the monitor has been mailed back to [facility].   Call [place] at [phone] if you have questions regarding your ZIO XT patch monitor.  Call them immediately  if you see an orange light blinking on your monitor.   If your monitor falls off in less than 4 days contact our Monitor department at [phone].  If your monitor becomes loose or falls off after 4 days call [employer] at [phone] for suggestions on securing your monitor.   Follow-Up: At [facility], you and your health needs are our priority.  As part of our continuing mission to provide you with exceptional heart care,  we have created designated Provider Care Teams.  These Care Teams include your primary Cardiologist (physician) and Advanced Practice Providers (APPs -  Physician Assistants and Nurse Practitioners) who all work together to provide you with the care you need, when you need it.  We recommend signing up for the patient portal called "MyChart".  Sign up information is provided on this After Visit Summary.  MyChart is used to connect with patients for Virtual Visits (Telemedicine).  Patients are able to view lab/test results, encounter notes, upcoming appointments, etc.  Non-urgent messages can be sent to your provider as well.   To learn more about what you can do with MyChart, go to [email or web address].    Your next appointment:   3 month(s)  The format for your next appointment:   In Person  Provider:   You may see Dr. Johney Frame or one of the following Advanced Practice Providers on your designated Care Team:    Richardson Dopp, [name]  Robbie Lis, [place]       Signed, [name], MD  03/17/2020 9:08 AM    [place]

## 2020-03-17 ENCOUNTER — Telehealth: Payer: Self-pay | Admitting: Radiology

## 2020-03-17 ENCOUNTER — Ambulatory Visit (INDEPENDENT_AMBULATORY_CARE_PROVIDER_SITE_OTHER): Payer: Medicare Other | Admitting: Cardiology

## 2020-03-17 ENCOUNTER — Encounter: Payer: Self-pay | Admitting: Cardiology

## 2020-03-17 ENCOUNTER — Other Ambulatory Visit: Payer: Self-pay

## 2020-03-17 VITALS — BP 140/90 | HR 71 | Ht 60.0 in | Wt 207.0 lb

## 2020-03-17 DIAGNOSIS — R079 Chest pain, unspecified: Secondary | ICD-10-CM

## 2020-03-17 DIAGNOSIS — I739 Peripheral vascular disease, unspecified: Secondary | ICD-10-CM | POA: Diagnosis not present

## 2020-03-17 DIAGNOSIS — R0602 Shortness of breath: Secondary | ICD-10-CM | POA: Diagnosis not present

## 2020-03-17 DIAGNOSIS — R002 Palpitations: Secondary | ICD-10-CM

## 2020-03-17 MED ORDER — ROSUVASTATIN CALCIUM 20 MG PO TABS
20.0000 mg | ORAL_TABLET | Freq: Every day | ORAL | 3 refills | Status: DC
Start: 2020-03-17 — End: 2020-12-20

## 2020-03-17 NOTE — Patient Instructions (Addendum)
Medication Instructions:  Your physician has recommended you make the following change in your medication:  1-STOP Simvastatin 2-START Crestor 20 mg by mouth daily.  *If you need a refill on your cardiac medications before your next appointment, please call your pharmacy*  Lab Work: If you have labs (blood work) drawn today and your tests are completely normal, you will receive your results only by: Marland Kitchen MyChart Message (if you have MyChart) OR . A paper copy in the mail If you have any lab test that is abnormal or we need to change your treatment, we will call you to review the results.  Testing/Procedures: Your physician has requested that you have an echocardiogram. Echocardiography is a painless test that uses sound waves to create images of your heart. It provides your doctor with information about the size and shape of your heart and how well your heart's chambers and valves are working. This procedure takes approximately one hour. There are no restrictions for this procedure.  Your physician has requested that you have a lower extremity arterial duplex with ABI's. This test is an ultrasound of the arteries in the legs. It looks at arterial blood flow in the legs. Allow one hour for Lower Arterial scans. There are no restrictions or special instructions.   ZIO XT- Long Term Monitor Instructions   Your physician has requested you wear your ZIO patch monitor___14____days.   This is a single patch monitor.  [employer] supplies one patch monitor per enrollment.  Additional stickers are not available.   Please do not apply patch if you will be having a Nuclear Stress Test, Echocardiogram, Cardiac CT, MRI, or Chest Xray during the time frame you would be wearing the monitor. The patch cannot be worn during these tests.  You cannot remove and re-apply the ZIO XT patch monitor.   Your ZIO patch monitor will be sent USPS Priority mail from [facility] directly to your home address. The  monitor may also be mailed to a PO BOX if home delivery is not available.   It may take 3-5 days to receive your monitor after you have been enrolled.   Once you have received you monitor, please review enclosed instructions.  Your monitor has already been registered assigning a specific monitor serial # to you.   Applying the monitor   Shave hair from upper left chest.   Hold abrader disc by orange tab.  Rub abrader in 40 strokes over left upper chest as indicated in your monitor instructions.   Clean area with 4 enclosed alcohol pads .  Use all pads to assure are is cleaned thoroughly.  Let dry.   Apply patch as indicated in monitor instructions.  Patch will be place under collarbone on left side of chest with arrow pointing upward.   Rub patch adhesive wings for 2 minutes.Remove white label marked "1".  Remove white label marked "2".  Rub patch adhesive wings for 2 additional minutes.   While looking in a mirror, press and release button in center of patch.  A small green light will flash 3-4 times .  This will be your only indicator the monitor has been turned on.     Do not shower for the first 24 hours.  You may shower after the first 24 hours.   Press button if you feel a symptom. You will hear a small click.  Record Date, Time and Symptom in the Patient Log Book.   When you are ready to remove patch, follow  instructions on last 2 pages of Patient Log Book.  Stick patch monitor onto last page of Patient Log Book.   Place Patient Log Book in [place] box.  Use locking tab on box and tape box closed securely.  The [employer] has [employer] on it.  Please place in mailbox as soon as possible.  Your physician should have your test results approximately 7 days after the monitor has been mailed back to [facility].   Call [place] at [phone] if you have questions regarding your ZIO XT patch monitor.  Call them immediately if you see an orange light  blinking on your monitor.   If your monitor falls off in less than 4 days contact our Monitor department at [phone].  If your monitor becomes loose or falls off after 4 days call [employer] at [phone] for suggestions on securing your monitor.   Follow-Up: At [facility], you and your health needs are our priority.  As part of our continuing mission to provide you with exceptional heart care, we have created designated Provider Care Teams.  These Care Teams include your primary Cardiologist (physician) and Advanced Practice Providers (APPs -  Physician Assistants and Nurse Practitioners) who all work together to provide you with the care you need, when you need it.  We recommend signing up for the patient portal called "MyChart".  Sign up information is provided on this After Visit Summary.  MyChart is used to connect with patients for Virtual Visits (Telemedicine).  Patients are able to view lab/test results, encounter notes, upcoming appointments, etc.  Non-urgent messages can be sent to your provider as well.   To learn more about what you can do with MyChart, go to [email or web address].    Your next appointment:   3 month(s)  The format for your next appointment:   In Person  Provider:   You may see Dr. Johney Frame or one of the following Advanced Practice Providers on your designated Care Team:    Richardson Dopp, [name]  [place], [place]

## 2020-03-17 NOTE — Telephone Encounter (Signed)
Enrolled patient for a 14 day Zio XT  monitor to be mailed to patients home  °

## 2020-03-18 ENCOUNTER — Encounter (HOSPITAL_COMMUNITY): Payer: Self-pay | Admitting: Emergency Medicine

## 2020-03-18 ENCOUNTER — Ambulatory Visit: Payer: Self-pay | Admitting: *Deleted

## 2020-03-18 ENCOUNTER — Other Ambulatory Visit: Payer: Self-pay

## 2020-03-18 ENCOUNTER — Emergency Department (HOSPITAL_COMMUNITY): Payer: Medicare Other

## 2020-03-18 ENCOUNTER — Emergency Department (HOSPITAL_COMMUNITY)
Admission: EM | Admit: 2020-03-18 | Discharge: 2020-03-18 | Disposition: A | Discharge status: Home / Self Care | Payer: Medicare Other | Attending: Emergency Medicine | Admitting: Emergency Medicine

## 2020-03-18 DIAGNOSIS — R0789 Other chest pain: Secondary | ICD-10-CM | POA: Diagnosis not present

## 2020-03-18 DIAGNOSIS — I129 Hypertensive chronic kidney disease with stage 1 through stage 4 chronic kidney disease, or unspecified chronic kidney disease: Secondary | ICD-10-CM | POA: Insufficient documentation

## 2020-03-18 DIAGNOSIS — R059 Cough, unspecified: Secondary | ICD-10-CM | POA: Diagnosis not present

## 2020-03-18 DIAGNOSIS — Z79899 Other long term (current) drug therapy: Secondary | ICD-10-CM | POA: Insufficient documentation

## 2020-03-18 DIAGNOSIS — Z7982 Long term (current) use of aspirin: Secondary | ICD-10-CM | POA: Diagnosis not present

## 2020-03-18 DIAGNOSIS — R0602 Shortness of breath: Secondary | ICD-10-CM | POA: Diagnosis not present

## 2020-03-18 DIAGNOSIS — R079 Chest pain, unspecified: Secondary | ICD-10-CM | POA: Diagnosis not present

## 2020-03-18 DIAGNOSIS — N1832 Chronic kidney disease, stage 3b: Secondary | ICD-10-CM | POA: Diagnosis not present

## 2020-03-18 LAB — BASIC METABOLIC PANEL
Anion gap: 11 (ref 5–15)
BUN: 28 mg/dL — ABNORMAL HIGH (ref 8–23)
CO2: 24 mmol/L (ref 22–32)
Calcium: 9.1 mg/dL (ref 8.9–10.3)
Chloride: 109 mmol/L (ref 98–111)
Creatinine, Ser: 1.72 mg/dL — ABNORMAL HIGH (ref 0.44–1.00)
GFR, Estimated: 32 mL/min — ABNORMAL LOW (ref >60)
Glucose, Bld: 156 mg/dL — ABNORMAL HIGH (ref 70–99)
Potassium: 3.7 mmol/L (ref 3.5–5.1)
Sodium: 144 mmol/L (ref 135–145)

## 2020-03-18 LAB — CBC
HCT: 39.4 % (ref 36.0–46.0)
Hemoglobin: 12.7 g/dL (ref 12.0–15.0)
MCH: 28.7 pg (ref 26.0–34.0)
MCHC: 32.2 g/dL (ref 30.0–36.0)
MCV: 89.1 fL (ref 80.0–100.0)
Platelets: 236 10*3/uL (ref 150–400)
RBC: 4.42 MIL/uL (ref 3.87–5.11)
RDW: 14.3 % (ref 11.5–15.5)
WBC: 6.2 10*3/uL (ref 4.0–10.5)
nRBC: 0 % (ref 0.0–0.2)

## 2020-03-18 LAB — TROPONIN I (HIGH SENSITIVITY)
Troponin I (High Sensitivity): 5 ng/L (ref <18)
Troponin I (High Sensitivity): 5 ng/L (ref <18)

## 2020-03-18 NOTE — Telephone Encounter (Signed)
Patient states she has a large bruise on her L leg that appeared 2 days ago- she states it is sore and painful to touch. She is not sure how she got the injury- patient advised she needs to be seen for this- office is at lunch and I can send note to nurse to call her back to schedule. During this part of conversation- she states she feels SOB- advised her to ED now and she states she is going to call her daughter to take her. Called patient back and she has contacted her daughter to take her to ED.  Reason for Disposition . Large swelling or bruise > 2 inches (5 cm)  Answer Assessment - Initial Assessment Questions 1. MECHANISM: "How did the injury happen?" (e.g., twisting injury, direct blow)      unsure 2. ONSET: "When did the injury happen?" (Minutes or hours ago)      Bruise- noticed couple days ago 3. LOCATION: "Where is the injury located?"      L leg- below knee toward calve 4. APPEARANCE of INJURY: "What does the injury look like?"  (e.g., deformity of leg)     Seems to be breaking up- spreading 5. SEVERITY: "Can you put weight on that leg?" "Can you walk?"      Yes- not interfering 6. SIZE: For cuts, bruises, or swelling, ask: "How large is it?" (e.g., inches or centimeters)      Size of palm- 4" 7. PAIN: "Is there pain?" If Yes, ask: "How bad is the pain?"  (Scale 1-10; or mild, moderate, severe)     Tender to touch-achy all around- 5-6 8. TETANUS: For any breaks in the skin, ask: "When was the last tetanus booster?"     n/a 9. OTHER SYMPTOMS: "Do you have any other symptoms?"      No other symptoms with leg- feels pressure under ribs 10. PREGNANCY: "Is there any chance you are pregnant?" "When was your last menstrual period?"       n/a  Protocols used: LEG INJURY-A-AH

## 2020-03-18 NOTE — ED Notes (Signed)
Pt resting in bed. NADN 

## 2020-03-18 NOTE — ED Provider Notes (Signed)
[facility] EMERGENCY DEPARTMENT Provider Note   CSN: [id number] Arrival date & time: 03/18/20  1516     History Chief Complaint  Patient presents with  . Chest Pain    Megan Bradford is a 69 y.o. female with past medical history significant for CKD s/p renal transplant in 2016, hypertension, hyperlipidemia, PE in 2010. Not anticoagulated.  HPI Patient presents to emergency department today with chief complaint of chest pain.  She states the pain started this morning when she was sitting on the couch watching TV.  Pain is located in her left ribs.  She describes the pain as feeling like something is trying to push out of her ribs.  She states the pain was intermittent and radiated to her arm.  She describes the pain as an aching sensation.  She rates the pain 7 out of 10 in severity.  She did not take any over-the-counter medications for symptoms prior to arrival. She denies history of similar pain. Denies dyspnea on exertion, chest tightness or pressure, radiation to jaw or back, nausea, or diaphoresis.  She does have a family history of cardiac disease, her father had an MI in his 69s.  She does not smoke tobacco.  She also states she has a bruise on her left thigh that looked abnormal to her yesterday.  She states is been there for about a week.  She does not remember what she did to cause the bruise.  She states been putting lotion on it last night she thought it had raised bubbles.  Denies any associated pain. Denies exogenous estrogen use, lower extremity pain or swelling, recent travel or immobilization, cough or hemoptysis  Chart review shows patient saw cardiology yesterday.  For palpitations a Zio patch monitor was ordered for her to wear x14 days.  For the claudication plan was to check bilateral ABI.  For the dyspnea on exertion plan was to check Myoview and TTE however patient want to test with neurology before proceeding.  Cardiology note also included she had a  negative cath in 2008 and 2009 and reportedly normal stress test in 2016.    Past Medical History:  Diagnosis Date  . Cataract    Phreesia 11/24/2019  . Chronic kidney disease    Phreesia 11/24/2019  . Hypertension    Phreesia 11/24/2019    Patient Active Problem List   Diagnosis Date Noted  . Stage 3b chronic kidney disease ([place]) 12/17/2019  . History of kidney transplant 11/27/2019  . Essential hypertension 11/27/2019  . Dyslipidemia 11/27/2019  . Prediabetes 11/27/2019  . Body mass index (BMI) of 38.0-38.9 in adult 11/27/2019    Past Surgical History:  Procedure Laterality Date  . CESAREAN SECTION N/A    Phreesia 11/24/2019  . CHOLECYSTECTOMY N/A    Phreesia 11/24/2019  . EYE SURGERY N/A    Phreesia 11/24/2019     OB History   No obstetric history on file.     Family History  Problem Relation Age of Onset  . Diabetes Mother   . Asthma Mother   . Hypertension Father   . Coronary artery disease Father     Social History   Tobacco Use  . Smoking status: Never Smoker  . Smokeless tobacco: Never Used  Substance Use Topics  . Alcohol use: Never  . Drug use: Never    Home Medications Prior to Admission medications   Medication Sig Start Date End Date Taking? Authorizing Provider  amLODipine (NORVASC) 5 MG tablet Take 5  mg by mouth daily.    [provider]  aspirin EC 81 MG tablet Take 81 mg by mouth daily. Swallow whole.    [provider]  carvedilol (COREG) 12.5 MG tablet Take 12.5 mg by mouth 2 (two) times daily with a meal.    [provider]  Cholecalciferol (VITAMIN D3) 10 MCG (400 UNIT) CAPS Take by mouth.    [provider]  diclofenac Sodium (VOLTAREN) 1 % GEL Apply 2 g topically 4 (four) times daily. 12/01/19   Wieters, Hallie C, [name]  magnesium oxide (MAG-OX) 400 MG tablet Take 400 mg by mouth daily.    [provider]  multivitamin-lutein (OCUVITE-LUTEIN) CAPS capsule Take 1 capsule by mouth daily.     [provider]  mycophenolate (MYFORTIC) 180 MG EC tablet Take 180 mg by mouth 2 (two) times daily.    [provider]  pyridOXINE (VITAMIN B-6) 100 MG tablet Take 100 mg by mouth daily.    [provider]  rosuvastatin (CRESTOR) 20 MG tablet Take 1 tablet (20 mg total) by mouth daily. 03/17/20   [name], MD  tacrolimus (PROGRAF) 1 MG capsule Take 4 capsules (4 mg total) by mouth 2 (two) times daily. 03/01/20   Horald Pollen, MD    Allergies    Keflex [cephalexin]  Review of Systems   Review of Systems All other systems are reviewed and are negative for acute change except as noted in the HPI.  Physical Exam Updated Vital Signs BP 136/89   Pulse 77   Temp 98 F (36.7 C) (Oral)   Resp 16   Ht 5' (1.524 m)   Wt 94 kg   SpO2 100%   BMI 40.47 kg/m   Physical Exam Vitals and nursing note reviewed.  Constitutional:      General: She is not in acute distress.    Appearance: She is not ill-appearing.  HENT:     Head: Normocephalic and atraumatic.     Right Ear: Tympanic membrane and external ear normal.     Left Ear: Tympanic membrane and external ear normal.     Nose: Nose normal.     Mouth/Throat:     Mouth: Mucous membranes are moist.     Pharynx: Oropharynx is clear.  Eyes:     General: No scleral icterus.       Right eye: No discharge.        Left eye: No discharge.     Extraocular Movements: Extraocular movements intact.     Conjunctiva/sclera: Conjunctivae normal.     Pupils: Pupils are equal, round, and reactive to light.  Neck:     Vascular: No JVD.  Cardiovascular:     Rate and Rhythm: Normal rate and regular rhythm.     Pulses: Normal pulses.          Radial pulses are 2+ on the right side and 2+ on the left side.       Dorsalis pedis pulses are 2+ on the right side and 2+ on the left side.     Heart sounds: Normal heart sounds.  Pulmonary:     Comments: Lungs clear to auscultation in all fields. Symmetric  chest rise. No wheezing, rales, or rhonchi. Chest:       Comments: Tender to palpation. No overlying skin changes. No erythema or warmth. No crepitus. Abdominal:     Comments: Abdomen is soft, non-distended, and non-tender in all quadrants. No rigidity, no guarding. No peritoneal  signs.  Musculoskeletal:        General: Normal range of motion.     Cervical back: Normal range of motion.     Comments: Homans sign absent bilaterally, no lower extremity edema, no palpable cords, compartments are soft  Skin:    General: Skin is warm and dry.     Capillary Refill: Capillary refill takes less than 2 seconds.     Comments: Approximately 5 x 6 cm circular area of ecchymosis on the left inner thigh.  No open wounds, no raised bumps or edges.  Nontender to palpation.  Equal tactile temperature in all extremities  Neurological:     Mental Status: She is oriented to person, place, and time.     GCS: GCS eye subscore is 4. GCS verbal subscore is 5. GCS motor subscore is 6.     Comments: Fluent speech, no facial droop.  Psychiatric:        Behavior: Behavior normal.     ED Results / Procedures / Treatments   Labs (all labs ordered are listed, but only abnormal results are displayed) Labs Reviewed  BASIC METABOLIC PANEL - Abnormal; Notable for the following components:      Result Value   Glucose, Bld 156 (*)    BUN 28 (*)    Creatinine, Ser 1.72 (*)    GFR, Estimated 32 (*)    All other components within normal limits  CBC  TROPONIN I (HIGH SENSITIVITY)  TROPONIN I (HIGH SENSITIVITY)    EKG EKG Interpretation  Date/Time:  Thursday March 18 2020 15:18:16 EDT Ventricular Rate:  72 PR Interval:  152 QRS Duration: 82 QT Interval:  336 QTC Calculation: 367 R Axis:   105 Text Interpretation: Normal sinus rhythm Rightward axis Nonspecific ST and T wave abnormality No previous tracing Confirmed by Lajean Saver [phone]) on 03/18/2020 5:36:10 PM   Radiology DG Chest 2 View  Result  Date: 03/18/2020 CLINICAL DATA:  Chest pain and shortness of breath EXAM: CHEST - 2 VIEW COMPARISON:  None. FINDINGS: The heart size and mediastinal contours are within normal limits. Aortic knob calcifications. Both lungs are clear. The visualized skeletal structures are unremarkable. IMPRESSION: No active cardiopulmonary disease. Electronically Signed   By: Prudencio Pair M.D.   On: 03/18/2020 16:07    Procedures Procedures (including critical care time)  Medications Ordered in ED Medications - No data to display  ED Course  I have reviewed the triage vital signs and the nursing notes.  Pertinent labs & imaging results that were available during my care of the patient were reviewed by me and considered in my medical decision making (see chart for details).    MDM Rules/Calculators/A&P                          History provided by patient with additional history obtained from chart review.    Patient presents to the emergency department with chest pain. Patient nontoxic appearing, in no apparent distress, vitals without significant abnormality. Fairly benign physical exam. Pain is reproducible with palpation. DDX: ACS, pulmonary embolism, dissection, pneumothorax, effusion, infiltrate, arrhythmia, anemia, electrolyte derangement, MSK. Evaluation initiated with labs, EKG, and CXR. Patient on cardiac monitor.   Work-up in the ER unremarkable. Labs reviewed, no leukocytosis, anemia, or significant electrolyte abnormality. Creatinine consistent with baseline. CXR without infiltrate, effusion, pneumothorax, or fracture/dislocation.   Heart score of 4, EKG without obvious ischemia, delta troponin negative, doubt ACS. Presentation is not suggestive of  pulmonary embolism. Pain is not a tearing sensation, symmetric pulses, no widening of mediastinum on CXR, doubt dissection. Cardiac monitor reviewed, no notable arrhythmias or tachycardia. Patient has appeared hemodynamically stable throughout ER visit  and appears safe for discharge with close PCP/cardiology follow up. I discussed results, treatment plan, need for PCP follow-up, and return precautions with the patient. Provided opportunity for questions, patient confirmed understanding and is in agreement with plan.   Case has been discussed with and seen by Dr. Ashok Cordia who agrees with the above plan to discharge.    Portions of this note were generated with Lobbyist. Dictation errors may occur despite best attempts at proofreading.   Final Clinical Impression(s) / ED Diagnoses Final diagnoses:  Atypical chest pain    Rx / DC Orders ED Discharge Orders    None       Lewanda Rife 03/18/20 1944    Lajean Saver, MD 03/18/20 2309

## 2020-03-18 NOTE — ED Notes (Signed)
Pt from ED Lobby to ED 35

## 2020-03-18 NOTE — Discharge Instructions (Addendum)
Your creatinine today was 1.72.  You should increase your water intake and have this rechecked by your primary care doctor in 1 week.   Read instructions below for reasons to return to the Emergency Department. It is recommended that your follow up with your Primary Care Doctor and cardiology in regards to today's visit.  Tests performed today include: An EKG of your heart A chest x-ray Cardiac enzymes - a blood test for heart muscle damage Blood counts and electrolytes  Chest Pain (Nonspecific)  HOME CARE INSTRUCTIONS  -For the next few days, avoid physical activities that bring on chest pain. Continue physical activities as directed.  -Follow your caregiver's suggestions for further testing if your chest pain does not go away.  -Keep any follow-up appointments you made. If you do not go to an appointment, you could develop lasting (chronic) problems with pain. If there is any problem keeping an appointment, you must call to reschedule.   SEEK MEDICAL CARE IF:  You think you are having problems from the medicine you are taking. Read your medicine instructions carefully.  Your chest pain does not go away, even after treatment.  You develop a rash with blisters on your chest.   SEEK IMMEDIATE MEDICAL CARE IF:  You have increased chest pain or pain that spreads to your arm, neck, jaw, back, or belly (abdomen).  You develop shortness of breath, an increasing cough, or you are coughing up blood.  You have severe back or abdominal pain, feel sick to your stomach (nauseous) or throw up (vomit).  You develop severe weakness, fainting, or chills.  You have an oral temperature above 102 F (38.9 C), not controlled by medicine.   THIS IS AN EMERGENCY. Do not wait to see if the pain will go away. Get medical help at once. Call 911. Do not drive yourself to the hospital.

## 2020-03-18 NOTE — ED Triage Notes (Signed)
Pt endorses SOB for a few days and CP. Hx of PE and not on blood thinner. Pt endorses a bruise on her left leg that she is worried about. Aching feeling in chest.

## 2020-03-18 NOTE — ED Notes (Signed)
Pt resting in bed. Conversing with daughter. NADN

## 2020-03-19 NOTE — Telephone Encounter (Signed)
Pt will need Hosp follow up

## 2020-03-19 NOTE — Telephone Encounter (Signed)
Pt is going to ED today 03/19/20 pt will be advised to contact our office if pt has stayed over night at the ED that would need to be scheduled as a HOS F/U.

## 2020-03-20 ENCOUNTER — Other Ambulatory Visit (INDEPENDENT_AMBULATORY_CARE_PROVIDER_SITE_OTHER): Payer: Medicare Other

## 2020-03-20 DIAGNOSIS — R002 Palpitations: Secondary | ICD-10-CM

## 2020-03-22 ENCOUNTER — Telehealth: Payer: Self-pay | Admitting: *Deleted

## 2020-03-22 NOTE — Telephone Encounter (Signed)
    TRANSITIONAL CARE MANAGEMENT TELEPHONE NOTE   Contact Date: 03/22/2020 Contacted By: [facility] Date of Discharge11/08/2019 Discharge Facility: [facility] Discharge Diagnosis: chest pain  Outpatient Follow Up Recommendations  Cardiology/PCP  Megan Bradford is a female primary care patient of Mitchel Honour, Megan Bloomer, MD. An outgoing telephone call was made today and I spoke with patient.  Megan Bradford condition(s) and treatment(s) were discussed. An opportunity to ask questions was provided and all were answered or forward as appropriate.    ACTIVITIES OF DAILY LIVING  Megan Bradford,  she can perform ADLs independently. She is her primary caregiver. Transportation to appointments, to pick up medications, and to run errands no a problem.     Fall Risk Fall Risk  03/01/2020 12/17/2019  Falls in the past year? 0 0  Number falls in past yr: 0 0  Injury with Fall? 0 0  Follow up Falls evaluation completed Falls evaluation completed    low [place] Modifications/Assistive Devices Wheelchair: no   Cane: no Ramp: no Bedside Toilet no Hospital Bed:  no Other:   [place] no   MEDICATION RECONCILIATION (fulfills Medicare 30 day post-discharge medication reconciliation requirement) Megan Bradford has been able to pick-up all prescribed discharge medications from the pharmacy.   A post discharge medication reconciliation was performed and the complete medication list was reviewed with the patient/caregiver and is current as of 03/22/2020. Changes highlighted below.  Discontinued Medications   Current Medication List Allergies as of 03/22/2020      Reactions   Keflex [cephalexin] Anaphylaxis      Medication List       Accurate as of March 22, 2020 11:25 AM. If you have any questions, ask your nurse or doctor.        amLODipine 5 MG tablet Commonly known as: NORVASC Take 5 mg by mouth daily.     aspirin EC 81 MG tablet Take 81 mg by mouth daily. Swallow whole.   carvedilol 12.5 MG tablet Commonly known as: COREG Take 12.5 mg by mouth 2 (two) times daily with a meal.   diclofenac Sodium 1 % Gel Commonly known as: VOLTAREN Apply 2 g topically 4 (four) times daily.   magnesium oxide 400 MG tablet Commonly known as: MAG-OX Take 400 mg by mouth daily.   multivitamin-lutein Caps capsule Take 1 capsule by mouth daily.   mycophenolate 180 MG EC tablet Commonly known as: MYFORTIC Take 180 mg by mouth 2 (two) times daily.   pyridOXINE 100 MG tablet Commonly known as: VITAMIN B-6 Take 100 mg by mouth daily.   rosuvastatin 20 MG tablet Commonly known as: CRESTOR Take 1 tablet (20 mg total) by mouth daily.   tacrolimus 1 MG capsule Commonly known as: PROGRAF Take 4 capsules (4 mg total) by mouth 2 (two) times daily.   Vitamin D3 10 MCG (400 UNIT) Caps Take by mouth.        PATIENT EDUCATION & FOLLOW-UP PLAN  An appointment for Transitional Care Management is scheduled with Maximiano Coss on 03-31-2020 at 10:50 am  Take all medications as prescribed  Contact our office by calling [phone] if you have any questions or concerns

## 2020-03-31 ENCOUNTER — Encounter: Payer: Self-pay | Admitting: Registered Nurse

## 2020-03-31 ENCOUNTER — Other Ambulatory Visit: Payer: Self-pay

## 2020-03-31 ENCOUNTER — Ambulatory Visit (INDEPENDENT_AMBULATORY_CARE_PROVIDER_SITE_OTHER): Payer: Medicare Other | Admitting: Registered Nurse

## 2020-03-31 VITALS — BP 151/99 | HR 78 | Temp 98.0°F | Resp 18 | Ht 60.0 in | Wt 204.4 lb

## 2020-03-31 DIAGNOSIS — R0789 Other chest pain: Secondary | ICD-10-CM

## 2020-03-31 DIAGNOSIS — M65331 Trigger finger, right middle finger: Secondary | ICD-10-CM

## 2020-03-31 NOTE — Patient Instructions (Signed)
° ° ° °  If you have lab work done today you will be contacted with your lab results within the next 2 weeks.  If you have not heard from us then please contact us. The fastest way to get your results is to register for My Chart. ° ° °IF you received an x-ray today, you will receive an invoice from [facility]. Please contact [facility] at [phone] with questions or concerns regarding your invoice.  ° °IF you received labwork today, you will receive an invoice from [facility]. Please contact [facility] at [phone] with questions or concerns regarding your invoice.  ° °Our billing staff will not be able to assist you with questions regarding bills from these companies. ° °You will be contacted with the lab results as soon as they are available. The fastest way to get your results is to activate your My Chart account. Instructions are located on the last page of this paperwork. If you have not heard from us regarding the results in 2 weeks, please contact this office. °  ° ° ° °

## 2020-04-12 ENCOUNTER — Encounter: Payer: Self-pay | Admitting: Pulmonary Disease

## 2020-04-12 ENCOUNTER — Ambulatory Visit (INDEPENDENT_AMBULATORY_CARE_PROVIDER_SITE_OTHER): Payer: Medicare Other | Admitting: Pulmonary Disease

## 2020-04-12 ENCOUNTER — Other Ambulatory Visit: Payer: Self-pay

## 2020-04-12 VITALS — BP 126/80 | HR 66 | Ht 60.0 in | Wt 206.2 lb

## 2020-04-12 DIAGNOSIS — R0600 Dyspnea, unspecified: Secondary | ICD-10-CM

## 2020-04-12 NOTE — Progress Notes (Signed)
Megan Bradford    [id number]    May 05, 1951  Primary Care Physician:Sagardia, Ines Bloomer, MD  Referring Physician: Horald Pollen, MD [place],  [place] [address]  Chief complaint: Evaluation for dyspnea  HPI: 69 year old with CKD s/p renal transplant in 2016, hypertension, hyperlipidemia, PE in 2010. Not anticoagulated Moved here from [place] in early 2021 and is here to establish care  She was evaluated by Dr. Corky Sox in [place] several years ago for possible sarcoidosis, dyspnea.  Apparently she had PFTs and lung function test which did not show any evidence of sarcoidosis.  She had been followed with regular tests and would like to resume these.  Complains of mild dyspnea on exertion.  No cough, fevers, chills, wheezing  Pets: No pets Occupation: Retired Scientist, research (physical sciences) Exposures: Has noticed mold in her new apartment in [place].  She is in the process of getting that remediated Smoking history: Never smoker.  She has been exposed to secondhand smoke Travel history: Originally from [place], moved to [place] in early 2021 Relevant family history: No significant family of lung disease  Outpatient Encounter Medications as of 04/12/2020  Medication Sig  . amLODipine (NORVASC) 5 MG tablet Take 5 mg by mouth daily.  Marland Kitchen aspirin EC 81 MG tablet Take 81 mg by mouth daily. Swallow whole.  . carvedilol (COREG) 12.5 MG tablet Take 12.5 mg by mouth 2 (two) times daily with a meal.  . Cholecalciferol (VITAMIN D3) 10 MCG (400 UNIT) CAPS Take by mouth.  . Cyanocobalamin (VITAMIN B 12 PO) Take 1 tablet by mouth daily.  . diclofenac Sodium (VOLTAREN) 1 % GEL Apply 2 g topically 4 (four) times daily.  . magnesium oxide (MAG-OX) 400 MG tablet Take 400 mg by mouth daily.  . multivitamin-lutein (OCUVITE-LUTEIN) CAPS capsule Take 1 capsule by mouth daily.  . mycophenolate (MYFORTIC) 180 MG EC tablet Take 180 mg by mouth 2  (two) times daily.  Marland Kitchen pyridOXINE (VITAMIN B-6) 100 MG tablet Take 100 mg by mouth daily.  . rosuvastatin (CRESTOR) 20 MG tablet Take 1 tablet (20 mg total) by mouth daily.  . tacrolimus (PROGRAF) 1 MG capsule Take 4 capsules (4 mg total) by mouth 2 (two) times daily.   No facility-administered encounter medications on file as of 04/12/2020.    Allergies as of 04/12/2020 - Review Complete 04/12/2020  Allergen Reaction Noted  . Keflex [cephalexin] Anaphylaxis 12/17/2019    Past Medical History:  Diagnosis Date  . Cataract    Phreesia 11/24/2019  . Chronic kidney disease    Phreesia 11/24/2019  . Hypertension    Phreesia 11/24/2019    Past Surgical History:  Procedure Laterality Date  . CESAREAN SECTION N/A    Phreesia 11/24/2019  . CHOLECYSTECTOMY N/A    Phreesia 11/24/2019  . EYE SURGERY N/A    Phreesia 11/24/2019    Family History  Problem Relation Age of Onset  . Diabetes Mother   . Asthma Mother   . Hypertension Father   . Coronary artery disease Father     Social History   Socioeconomic History  . Marital status: Widowed    Spouse name: Not on file  . Number of children: Not on file  . Years of education: Not on file  . Highest education level: Not on file  Occupational History  . Not on file  Tobacco Use  . Smoking status: Never Smoker  . Smokeless tobacco: Never Used  Substance and Sexual Activity  . Alcohol use: Never  . Drug use: Never  . Sexual activity: Not Currently    Birth control/protection: None  Other Topics Concern  . Not on file  Social History Narrative  . Not on file   Social Determinants of Health   Financial Resource Strain:   . Difficulty of Paying Living Expenses: Not on file  Food Insecurity:   . Worried About Charity fundraiser in the Last Year: Not on file  . Ran Out of Food in the Last Year: Not on file  Transportation Needs:   . Lack of Transportation (Medical): Not on file  . Lack of Transportation (Non-Medical):  Not on file  Physical Activity:   . Days of Exercise per Week: Not on file  . Minutes of Exercise per Session: Not on file  Stress:   . Feeling of Stress : Not on file  Social Connections:   . Frequency of Communication with Friends and Family: Not on file  . Frequency of Social Gatherings with Friends and Family: Not on file  . Attends Religious Services: Not on file  . Active Member of Clubs or Organizations: Not on file  . Attends Archivist Meetings: Not on file  . Marital Status: Not on file  Intimate Partner Violence:   . Fear of Current or Ex-Partner: Not on file  . Emotionally Abused: Not on file  . Physically Abused: Not on file  . Sexually Abused: Not on file    Review of systems: Review of Systems  Constitutional: Negative for fever and chills.  HENT: Negative.   Eyes: Negative for blurred vision.  Respiratory: as per HPI  Cardiovascular: Negative for chest pain and palpitations.  Gastrointestinal: Negative for vomiting, diarrhea, blood per rectum. Genitourinary: Negative for dysuria, urgency, frequency and hematuria.  Musculoskeletal: Negative for myalgias, back pain and joint pain.  Skin: Negative for itching and rash.  Neurological: Negative for dizziness, tremors, focal weakness, seizures and loss of consciousness.  Endo/Heme/Allergies: Negative for environmental allergies.  Psychiatric/Behavioral: Negative for depression, suicidal ideas and hallucinations.  All other systems reviewed and are negative.  Physical Exam: Blood pressure 126/80, pulse 66, height 5' (1.524 m), weight 206 lb 3.2 oz (93.5 kg), SpO2 100 %. Gen:      No acute distress HEENT:  EOMI, sclera anicteric Neck:     No masses; no thyromegaly Lungs:    Clear to auscultation bilaterally; normal respiratory effort CV:         Regular rate and rhythm; no murmurs Abd:      + bowel sounds; soft, non-tender; no palpable masses, no distension Ext:    No edema; adequate peripheral  perfusion Skin:      Warm and dry; no rash Neuro: alert and oriented x 3 Psych: normal mood and affect  Data Reviewed: Imaging: Chest x-ray 03/18/2020-lungs are clear with no active cardiopulmonary disease I have reviewed the images personally.  PFTs:  Labs: CBC 11/27/2019-WBC 7.1, eos 1%, absolute eosinophil count 71  Assessment:  Evaluation for dyspnea She has been evaluated for sarcoidosis previously with a negative work-up in [place] Chest x-ray earlier this month was clear with no evidence of any abnormality We will schedule PFTs for evaluation.  If abnormal then consider CT scan  Advised her to get the mold issue in her apartment fixed or move out to a new living space.  Health maintenance Up-to-date with flu and Pneumovax She is waiting to get her booster Covid  vaccine  Plan/Recommendations: PFTs  Marshell Garfinkel MD [facility] 04/12/2020, 9:11 AM  CC: Horald Pollen, *

## 2020-04-12 NOTE — Patient Instructions (Signed)
We will get lung function test for evaluation of your lung Follow-up in 1 to 2 months to review

## 2020-04-13 ENCOUNTER — Ambulatory Visit (HOSPITAL_COMMUNITY): Payer: Medicare Other | Attending: Cardiology

## 2020-04-13 DIAGNOSIS — R0602 Shortness of breath: Secondary | ICD-10-CM | POA: Insufficient documentation

## 2020-04-13 DIAGNOSIS — R002 Palpitations: Secondary | ICD-10-CM | POA: Diagnosis not present

## 2020-04-13 LAB — ECHOCARDIOGRAM COMPLETE
Area-P 1/2: 3.91 cm2
S' Lateral: 2.1 cm

## 2020-04-14 ENCOUNTER — Telehealth: Payer: Self-pay | Admitting: Cardiology

## 2020-04-14 NOTE — Telephone Encounter (Signed)
Echo looks great with normal pumping function and no significant valvular disease. We just need to watch her blood pressure as the heart relaxes a little abnormally.

## 2020-04-14 NOTE — Telephone Encounter (Signed)
Pt aware of echo results ./cy 

## 2020-04-14 NOTE — Telephone Encounter (Signed)
Patient is returning call to discuss echo results. °

## 2020-04-15 ENCOUNTER — Ambulatory Visit (HOSPITAL_COMMUNITY)
Admission: RE | Admit: 2020-04-15 | Discharge: 2020-04-15 | Disposition: A | Discharge status: Home / Self Care | Payer: Medicare Other | Source: Ambulatory Visit | Attending: Cardiovascular Disease | Admitting: Cardiovascular Disease

## 2020-04-15 ENCOUNTER — Other Ambulatory Visit: Payer: Self-pay

## 2020-04-15 DIAGNOSIS — I739 Peripheral vascular disease, unspecified: Secondary | ICD-10-CM | POA: Diagnosis not present

## 2020-04-16 ENCOUNTER — Telehealth: Payer: Self-pay | Admitting: Cardiology

## 2020-04-16 NOTE — Telephone Encounter (Signed)
Patient is returning call to discuss LE arterial duplex results.

## 2020-04-16 NOTE — Telephone Encounter (Signed)
I spoke with patient and reviewed LEA dopplers, echo and monitor results with her.

## 2020-04-21 ENCOUNTER — Other Ambulatory Visit: Payer: Self-pay

## 2020-04-21 ENCOUNTER — Ambulatory Visit (INDEPENDENT_AMBULATORY_CARE_PROVIDER_SITE_OTHER): Payer: Medicare Other | Admitting: Orthopaedic Surgery

## 2020-04-21 ENCOUNTER — Encounter: Payer: Self-pay | Admitting: Orthopaedic Surgery

## 2020-04-21 DIAGNOSIS — M65331 Trigger finger, right middle finger: Secondary | ICD-10-CM

## 2020-04-21 MED ORDER — METHYLPREDNISOLONE ACETATE 40 MG/ML IJ SUSP
13.3300 mg | INTRAMUSCULAR | Status: AC | PRN
  Administered 2020-04-21: 13.33 mg

## 2020-04-21 MED ORDER — BUPIVACAINE HCL 0.5 % IJ SOLN
0.3300 mL | INTRAMUSCULAR | Status: AC | PRN
  Administered 2020-04-21: .33 mL

## 2020-04-21 MED ORDER — LIDOCAINE HCL 1 % IJ SOLN
0.3000 mL | INTRAMUSCULAR | Status: AC | PRN
  Administered 2020-04-21: .3 mL

## 2020-04-21 NOTE — Progress Notes (Signed)
Office Visit Note   Patient: Megan Bradford           Date of Birth: [birth year]           MRN: [id number] Visit Date: 04/21/2020              Requested by: Maximiano Coss, NP [place],  [place] [id number] PCP: Horald Pollen, MD   Assessment & Plan: Visit Diagnoses:  1. Trigger finger, right middle finger     Plan: Impression is right long trigger finger.  Based on discussion of treatment options patient agreed to undergo cortisone injection today.  She tolerated this well.  We will see her back as needed.  Follow-Up Instructions: Return if symptoms worsen or fail to improve.   Orders:  No orders of the defined types were placed in this encounter.  No orders of the defined types were placed in this encounter.     Procedures: Hand/UE Inj: R long A1 for trigger finger on 04/21/2020 5:12 PM Indications: pain Details: 25 G needle Medications: 0.3 mL lidocaine 1 %; 0.33 mL bupivacaine 0.5 %; 13.33 mg methylPREDNISolone acetate 40 MG/ML Outcome: tolerated well, no immediate complications Consent was given by the patient. Patient was prepped and draped in the usual sterile fashion.       Clinical Data: No additional findings.   Subjective: Chief Complaint  Patient presents with  . Right Middle Finger - Pain    Megan Bradford is a very pleasant 69 year old female comes in for evaluation of a painful right long finger.  This has been going on for at least a month.  Denies any injuries.  She reports locking of the finger after she makes a fist.  Denies any history of diabetes.  She is a kidney transplant patient.   Review of Systems  Constitutional: Negative.   HENT: Negative.   Eyes: Negative.   Respiratory: Negative.   Cardiovascular: Negative.   Endocrine: Negative.   Musculoskeletal: Negative.   Neurological: Negative.   Hematological: Negative.   Psychiatric/Behavioral: Negative.   All other systems reviewed and are  negative.    Objective: Vital Signs: There were no vitals taken for this visit.  Physical Exam Vitals and nursing note reviewed.  Constitutional:      Appearance: She is well-developed.  Pulmonary:     Effort: Pulmonary effort is normal.  Skin:    General: Skin is warm.     Capillary Refill: Capillary refill takes less than 2 seconds.  Neurological:     Mental Status: She is alert and oriented to person, place, and time.  Psychiatric:        Behavior: Behavior normal.        Thought Content: Thought content normal.        Judgment: Judgment normal.     Ortho Exam Right long finger shows tenderness along the flexor tendon sheath most notably in the palm.  There is active triggering and catching.  No neurovascular compromise.  Arc of motion is normal. Specialty Comments:  No specialty comments available.  Imaging: No results found.   PMFS History: Patient Active Problem List   Diagnosis Date Noted  . Stage 3b chronic kidney disease ([place]) 12/17/2019  . History of kidney transplant 11/27/2019  . Essential hypertension 11/27/2019  . Dyslipidemia 11/27/2019  . Prediabetes 11/27/2019  . Body mass index (BMI) of 38.0-38.9 in adult 11/27/2019   Past Medical History:  Diagnosis Date  . Cataract    Phreesia 11/24/2019  .  Chronic kidney disease    Phreesia 11/24/2019  . Hypertension    Phreesia 11/24/2019    Family History  Problem Relation Age of Onset  . Diabetes Mother   . Asthma Mother   . Hypertension Father   . Coronary artery disease Father     Past Surgical History:  Procedure Laterality Date  . CESAREAN SECTION N/A    Phreesia 11/24/2019  . CHOLECYSTECTOMY N/A    Phreesia 11/24/2019  . EYE SURGERY N/A    Phreesia 11/24/2019   Social History   Occupational History  . Not on file  Tobacco Use  . Smoking status: Never Smoker  . Smokeless tobacco: Never Used  Substance and Sexual Activity  . Alcohol use: Never  . Drug use: Never  . Sexual  activity: Not Currently    Birth control/protection: None

## 2020-04-22 DIAGNOSIS — Z940 Kidney transplant status: Secondary | ICD-10-CM | POA: Diagnosis not present

## 2020-04-22 DIAGNOSIS — E559 Vitamin D deficiency, unspecified: Secondary | ICD-10-CM | POA: Diagnosis not present

## 2020-04-28 DIAGNOSIS — I129 Hypertensive chronic kidney disease with stage 1 through stage 4 chronic kidney disease, or unspecified chronic kidney disease: Secondary | ICD-10-CM | POA: Diagnosis not present

## 2020-04-28 DIAGNOSIS — Z79899 Other long term (current) drug therapy: Secondary | ICD-10-CM | POA: Diagnosis not present

## 2020-04-28 DIAGNOSIS — N1832 Chronic kidney disease, stage 3b: Secondary | ICD-10-CM | POA: Diagnosis not present

## 2020-04-28 DIAGNOSIS — R809 Proteinuria, unspecified: Secondary | ICD-10-CM | POA: Diagnosis not present

## 2020-04-28 DIAGNOSIS — Z940 Kidney transplant status: Secondary | ICD-10-CM | POA: Diagnosis not present

## 2020-05-06 ENCOUNTER — Telehealth (HOSPITAL_COMMUNITY): Payer: Self-pay | Admitting: Cardiology

## 2020-05-06 NOTE — Telephone Encounter (Signed)
Patient states that her and Dr. Johney Frame decided to wait on test and she will call back at a later date if she needs to reschedule. Order will be removed from the WQ.

## 2020-05-06 NOTE — Telephone Encounter (Signed)
Routing to [employer]

## 2020-05-06 NOTE — Telephone Encounter (Signed)
Pt would like to wait to have myoview performed.

## 2020-05-11 ENCOUNTER — Other Ambulatory Visit: Payer: Self-pay

## 2020-05-11 ENCOUNTER — Ambulatory Visit (INDEPENDENT_AMBULATORY_CARE_PROVIDER_SITE_OTHER): Payer: Medicare Other | Admitting: Pulmonary Disease

## 2020-05-11 DIAGNOSIS — R0600 Dyspnea, unspecified: Secondary | ICD-10-CM

## 2020-05-11 LAB — PULMONARY FUNCTION TEST
DL/VA % pred: 94 %
DL/VA: 4 ml/min/mmHg/L
DLCO cor % pred: 70 %
DLCO cor: 13.35 ml/min/mmHg
DLCO unc % pred: 69 %
DLCO unc: 13.05 ml/min/mmHg
FEF 25-75 Post: 2.11 L/sec
FEF 25-75 Pre: 1.89 L/sec
FEF2575-%Change-Post: 11 %
FEF2575-%Pred-Post: 134 %
FEF2575-%Pred-Pre: 120 %
FEV1-%Change-Post: 4 %
FEV1-%Pred-Post: 90 %
FEV1-%Pred-Pre: 86 %
FEV1-Post: 1.54 L
FEV1-Pre: 1.47 L
FEV1FVC-%Change-Post: 0 %
FEV1FVC-%Pred-Pre: 109 %
FEV6-%Change-Post: 4 %
FEV6-%Pred-Post: 92 %
FEV6-%Pred-Pre: 88 %
FEV6-Post: 1.76 L
FEV6-Pre: 1.69 L
FEV6FVC-%Pred-Post: 113 %
FEV6FVC-%Pred-Pre: 113 %
FVC-%Change-Post: 4 %
FVC-%Pred-Post: 81 %
FVC-%Pred-Pre: 78 %
FVC-Post: 1.76 L
FVC-Pre: 1.69 L
Post FEV1/FVC ratio: 87 %
Post FEV6/FVC ratio: 100 %
Pre FEV1/FVC ratio: 87 %
Pre FEV6/FVC Ratio: 100 %

## 2020-05-11 NOTE — Progress Notes (Signed)
Full PFT performed today. °

## 2020-05-18 ENCOUNTER — Encounter: Payer: Self-pay | Admitting: Pulmonary Disease

## 2020-05-18 ENCOUNTER — Other Ambulatory Visit: Payer: Self-pay

## 2020-05-18 ENCOUNTER — Ambulatory Visit (INDEPENDENT_AMBULATORY_CARE_PROVIDER_SITE_OTHER): Payer: Medicare Other | Admitting: Pulmonary Disease

## 2020-05-18 VITALS — BP 142/82 | HR 87 | Ht 60.0 in | Wt 204.8 lb

## 2020-05-18 DIAGNOSIS — D869 Sarcoidosis, unspecified: Secondary | ICD-10-CM

## 2020-05-18 DIAGNOSIS — R0600 Dyspnea, unspecified: Secondary | ICD-10-CM | POA: Diagnosis not present

## 2020-05-18 NOTE — Progress Notes (Unsigned)
Megan Bradford    [id number]    Jul 02, 1950  Primary Care Physician:Sagardia, Ines Bloomer, MD  Referring Physician: Horald Pollen, MD [place],  [place] [id number]  Chief complaint: Follow-up for dyspnea  HPI: 69 year old with CKD s/p renal transplant in 2016, hypertension, hyperlipidemia, PE in 2010. Not anticoagulated Moved here from [place] in early 2021 and is here to establish care  She was evaluated by Dr. Corky Sox in [place] several years ago for possible sarcoidosis, dyspnea.  Apparently she had PFTs and lung function test which did not show any evidence of sarcoidosis.  She had been followed with regular tests and would like to resume these.  Complains of mild dyspnea on exertion.  No cough, fevers, chills, wheezing  Pets: No pets Occupation: Retired Scientist, research (physical sciences) Exposures: Has noticed mold in her new apartment in [place].  She is in the process of getting that remediated Smoking history: Never smoker.  She has been exposed to secondhand smoke Travel history: Originally from [place], moved to [place] in early 2021 Relevant family history: No significant family of lung disease  Interim history: States that mild dyspnea is about the same Here for review of PFTs  Outpatient Encounter Medications as of 05/18/2020  Medication Sig  . amLODipine (NORVASC) 5 MG tablet Take 5 mg by mouth daily.  Marland Kitchen aspirin EC 81 MG tablet Take 81 mg by mouth daily. Swallow whole.  . carvedilol (COREG) 12.5 MG tablet Take 12.5 mg by mouth 2 (two) times daily with a meal.  . Cholecalciferol (VITAMIN D3) 10 MCG (400 UNIT) CAPS Take by mouth.  . Cyanocobalamin (VITAMIN B 12 PO) Take 1 tablet by mouth daily.  . diclofenac Sodium (VOLTAREN) 1 % GEL Apply 2 g topically 4 (four) times daily.  . magnesium oxide (MAG-OX) 400 MG tablet Take 400 mg by mouth daily.  . multivitamin-lutein (OCUVITE-LUTEIN) CAPS capsule Take 1 capsule by  mouth daily.  . mycophenolate (MYFORTIC) 180 MG EC tablet Take 180 mg by mouth 2 (two) times daily.  Marland Kitchen pyridOXINE (VITAMIN B-6) 100 MG tablet Take 100 mg by mouth daily.  . rosuvastatin (CRESTOR) 20 MG tablet Take 1 tablet (20 mg total) by mouth daily.  . tacrolimus (PROGRAF) 1 MG capsule Take 4 capsules (4 mg total) by mouth 2 (two) times daily.   No facility-administered encounter medications on file as of 05/18/2020.   Physical Exam: Blood pressure 126/80, pulse 66, height 5' (1.524 m), weight 206 lb 3.2 oz (93.5 kg), SpO2 100 %. Gen:      No acute distress HEENT:  EOMI, sclera anicteric Neck:     No masses; no thyromegaly Lungs:    Clear to auscultation bilaterally; normal respiratory effort CV:         Regular rate and rhythm; no murmurs Abd:      + bowel sounds; soft, non-tender; no palpable masses, no distension Ext:    No edema; adequate peripheral perfusion Skin:      Warm and dry; no rash Neuro: alert and oriented x 3 Psych: normal mood and affect  Data Reviewed: Imaging: Chest x-ray 03/18/2020-lungs are clear with no active cardiopulmonary disease I have reviewed the images personally.  PFTs:  05/12/2019 FVC 1.76 [81%], FEV1 1.54 [90%], TLC 2.81 [62%], DLCO 13.05 [69%] Moderate restriction and diffusion impairment  Labs: CBC 11/27/2019-WBC 7.1, eos 1%, absolute eosinophil count 71  Assessment:  Evaluation for dyspnea She has been evaluated for  sarcoidosis previously with a negative work-up in [place] Chest x-ray earlier this month was clear with no evidence of any abnormality PFTs today reviewed with some restriction and diffusion impairment. Get high-res CT for further evaluation  Advised her to get the mold issue in her apartment fixed or move out to a new living space.  Health maintenance Up-to-date with Covid and flu  Plan/Recommendations: High-res CT  Marshell Garfinkel MD [facility] 05/18/2020, 9:03 AM  CC: Horald Pollen, *

## 2020-05-20 ENCOUNTER — Ambulatory Visit: Payer: Medicare Other | Admitting: Pulmonary Disease

## 2020-05-21 ENCOUNTER — Telehealth: Payer: Self-pay | Admitting: *Deleted

## 2020-05-21 NOTE — Addendum Note (Signed)
Addended by: Elton Sin on: 05/21/2020 02:10 PM   Modules accepted: Orders

## 2020-05-21 NOTE — Telephone Encounter (Signed)
Spoke with Patient.  Patient aware HRCT order has been placed and recall for 3 month follow up.

## 2020-05-24 ENCOUNTER — Telehealth: Payer: Self-pay | Admitting: Pulmonary Disease

## 2020-05-24 NOTE — Telephone Encounter (Signed)
When I called to give the patient her CT appointment, pt stated that she will need sedation in order to complete this.

## 2020-05-25 MED ORDER — ALPRAZOLAM 0.25 MG PO TABS
ORAL_TABLET | ORAL | 0 refills | Status: DC
Start: 2020-05-25 — End: 2020-05-25

## 2020-05-25 MED ORDER — ALPRAZOLAM 0.25 MG PO TABS
ORAL_TABLET | ORAL | 0 refills | Status: DC
Start: 2020-05-25 — End: 2020-05-26

## 2020-05-25 MED ORDER — ALPRAZOLAM 0.25 MG PO TABS: ORAL_TABLET | ORAL | 0 refills | Status: DC

## 2020-05-25 NOTE — Telephone Encounter (Signed)
Looks like the one you did was printed please sign the order I have pended below. Thank you

## 2020-05-25 NOTE — Telephone Encounter (Signed)
Can give her a prescription for Xanax 0.25 mg p.o. once.  Take half an hour before CT scan

## 2020-05-25 NOTE — Telephone Encounter (Signed)
You will have to send in this prescription. I have called patient to make her aware.

## 2020-05-25 NOTE — Telephone Encounter (Signed)
Ok. Done 

## 2020-05-26 NOTE — Telephone Encounter (Signed)
Disp Refills Start End   ALPRAZolam (XANAX) 0.25 MG tablet 1 tablet 0 05/25/2020    Sig: Take half an hour before CT scan   Sent to pharmacy as: ALPRAZolam Duanne Moron) 0.25 MG tablet   E-Prescribing Status: Receipt confirmed by pharmacy (05/25/2020 3:59 PM EST)    Spoke with the pt  I advised that Dr Vaughan Browner sent xanax already  She states she can not take xanax because it causes her to be extremely violent  I added this to list of intolerances in her chart  She is specifically requested rx for ativan  Please advise, thanks

## 2020-05-26 NOTE — Telephone Encounter (Signed)
Patient would like [place] called into pharmacy. Pharmacy is [facility]. Patient phone number is [phone].

## 2020-05-27 MED ORDER — LORAZEPAM 0.5 MG PO TABS: 0.5000 mg | ORAL_TABLET | Freq: Once | ORAL | 0 refills | Status: AC

## 2020-05-27 NOTE — Telephone Encounter (Signed)
Spoke with the pt and notified of response  Nothing further needed 

## 2020-05-27 NOTE — Telephone Encounter (Signed)
Ok. I sent in order for Ativan

## 2020-06-07 ENCOUNTER — Telehealth: Payer: Self-pay | Admitting: Pulmonary Disease

## 2020-06-08 ENCOUNTER — Encounter: Payer: Self-pay | Admitting: Registered Nurse

## 2020-06-08 ENCOUNTER — Other Ambulatory Visit: Payer: Medicare Other

## 2020-06-08 DIAGNOSIS — R7303 Prediabetes: Secondary | ICD-10-CM | POA: Diagnosis not present

## 2020-06-08 DIAGNOSIS — Z79899 Other long term (current) drug therapy: Secondary | ICD-10-CM | POA: Diagnosis not present

## 2020-06-08 DIAGNOSIS — Z940 Kidney transplant status: Secondary | ICD-10-CM | POA: Diagnosis not present

## 2020-06-08 DIAGNOSIS — E785 Hyperlipidemia, unspecified: Secondary | ICD-10-CM | POA: Diagnosis not present

## 2020-06-08 DIAGNOSIS — I129 Hypertensive chronic kidney disease with stage 1 through stage 4 chronic kidney disease, or unspecified chronic kidney disease: Secondary | ICD-10-CM | POA: Diagnosis not present

## 2020-06-08 DIAGNOSIS — N1832 Chronic kidney disease, stage 3b: Secondary | ICD-10-CM | POA: Diagnosis not present

## 2020-06-08 DIAGNOSIS — N2581 Secondary hyperparathyroidism of renal origin: Secondary | ICD-10-CM | POA: Diagnosis not present

## 2020-06-08 DIAGNOSIS — R809 Proteinuria, unspecified: Secondary | ICD-10-CM | POA: Diagnosis not present

## 2020-06-08 NOTE — Progress Notes (Signed)
Established Patient Office Visit  Subjective:  Patient ID: Megan Bradford, female    DOB: May 12, 1951  Age: 70 y.o. MRN: [id number]  CC:  Chief Complaint  Patient presents with  . Hospitalization Follow-up    Patient states she is here for hospital follow up for chest pain. Per patient she is feeling much better today,    HPI Megan Bradford presents for hfu for chest pain  Was seen on 03/18/20 Work up largely negative and reassuring Asked to follow up with pcp Here today noting that chest pain has resolved, no other CV symptoms.  Does note, however, that she is having trouble releasing her finger from flexion. Middle digit on r hand. Not a lot of pain but it is starting to interfere with activity. Has not happened before. No acute injury noted. No redness or swelling  Otherwise without concern today   Past Medical History:  Diagnosis Date  . Cataract    Phreesia 11/24/2019  . Chronic kidney disease    Phreesia 11/24/2019  . Hypertension    Phreesia 11/24/2019    Past Surgical History:  Procedure Laterality Date  . CESAREAN SECTION N/A    Phreesia 11/24/2019  . CHOLECYSTECTOMY N/A    Phreesia 11/24/2019  . EYE SURGERY N/A    Phreesia 11/24/2019    Family History  Problem Relation Age of Onset  . Diabetes Mother   . Asthma Mother   . Hypertension Father   . Coronary artery disease Father     Social History   Socioeconomic History  . Marital status: Widowed    Spouse name: Not on file  . Number of children: Not on file  . Years of education: Not on file  . Highest education level: Not on file  Occupational History  . Not on file  Tobacco Use  . Smoking status: Never Smoker  . Smokeless tobacco: Never Used  Substance and Sexual Activity  . Alcohol use: Never  . Drug use: Never  . Sexual activity: Not Currently    Birth control/protection: None  Other Topics Concern  . Not on file  Social History Narrative  . Not on file   Social Determinants of  Health   Financial Resource Strain: Not on file  Food Insecurity: Not on file  Transportation Needs: Not on file  Physical Activity: Not on file  Stress: Not on file  Social Connections: Not on file  Intimate Partner Violence: Not on file    Outpatient Medications Prior to Visit  Medication Sig Dispense Refill  . amLODipine (NORVASC) 5 MG tablet Take 5 mg by mouth daily.    Marland Kitchen aspirin EC 81 MG tablet Take 81 mg by mouth daily. Swallow whole.    . carvedilol (COREG) 12.5 MG tablet Take 12.5 mg by mouth 2 (two) times daily with a meal.    . Cholecalciferol (VITAMIN D3) 10 MCG (400 UNIT) CAPS Take by mouth.    . diclofenac Sodium (VOLTAREN) 1 % GEL Apply 2 g topically 4 (four) times daily. 100 g 0  . magnesium oxide (MAG-OX) 400 MG tablet Take 400 mg by mouth daily.    . multivitamin-lutein (OCUVITE-LUTEIN) CAPS capsule Take 1 capsule by mouth daily.    . mycophenolate (MYFORTIC) 180 MG EC tablet Take 180 mg by mouth 2 (two) times daily.    Marland Kitchen pyridOXINE (VITAMIN B-6) 100 MG tablet Take 100 mg by mouth daily.    . rosuvastatin (CRESTOR) 20 MG tablet Take 1 tablet (20 mg total) by  mouth daily. 90 tablet 3  . tacrolimus (PROGRAF) 1 MG capsule Take 4 capsules (4 mg total) by mouth 2 (two) times daily. 720 capsule 1   No facility-administered medications prior to visit.    Allergies  Allergen Reactions  . Keflex [Cephalexin] Anaphylaxis  . Xanax [Alprazolam] Other (See Comments)    "makes me violent"    ROS Review of Systems Per hpi     Objective:    Physical Exam Vitals and nursing note reviewed.  Constitutional:      General: She is not in acute distress.    Appearance: Normal appearance. She is normal weight. She is not ill-appearing, toxic-appearing or diaphoretic.  Cardiovascular:     Rate and Rhythm: Normal rate and regular rhythm.     Heart sounds: Normal heart sounds. No murmur heard. No friction rub. No gallop.   Pulmonary:     Effort: Pulmonary effort is normal.  No respiratory distress.     Breath sounds: Normal breath sounds. No stridor. No wheezing, rhonchi or rales.  Chest:     Chest wall: No tenderness.  Musculoskeletal:     Comments: Apparent trigger finger of middle digit of R hand  Skin:    General: Skin is warm and dry.  Neurological:     General: No focal deficit present.     Mental Status: She is alert and oriented to person, place, and time. Mental status is at baseline.  Psychiatric:        Mood and Affect: Mood normal.        Behavior: Behavior normal.        Thought Content: Thought content normal.        Judgment: Judgment normal.     BP (!) 151/99   Pulse 78   Temp 98 F (36.7 C) (Temporal)   Resp 18   Ht 5' (1.524 m)   Wt 204 lb 6.4 oz (92.7 kg)   SpO2 98%   BMI 39.92 kg/m  Wt Readings from Last 3 Encounters:  05/18/20 204 lb 12.8 oz (92.9 kg)  04/12/20 206 lb 3.2 oz (93.5 kg)  03/31/20 204 lb 6.4 oz (92.7 kg)     Health Maintenance Due  Topic Date Due  . COVID-19 Vaccine (3 - Pfizer risk 4-dose series) 08/27/2019    There are no preventive care reminders to display for this patient.  No results found for: TSH Lab Results  Component Value Date   WBC 6.2 03/18/2020   HGB 12.7 03/18/2020   HCT 39.4 03/18/2020   MCV 89.1 03/18/2020   PLT 236 03/18/2020   Lab Results  Component Value Date   NA 144 03/18/2020   K 3.7 03/18/2020   CO2 24 03/18/2020   GLUCOSE 156 (H) 03/18/2020   BUN 28 (H) 03/18/2020   CREATININE 1.72 (H) 03/18/2020   BILITOT 0.8 11/27/2019   ALKPHOS 68 11/27/2019   AST 17 11/27/2019   ALT 7 11/27/2019   PROT 6.6 11/27/2019   ALBUMIN 4.1 11/27/2019   CALCIUM 9.1 03/18/2020   ANIONGAP 11 03/18/2020   Lab Results  Component Value Date   CHOL 203 (H) 11/27/2019   Lab Results  Component Value Date   HDL 73 11/27/2019   Lab Results  Component Value Date   LDLCALC 116 (H) 11/27/2019   Lab Results  Component Value Date   TRIG 81 11/27/2019   Lab Results  Component  Value Date   CHOLHDL 2.8 11/27/2019   Lab Results  Component Value  Date   HGBA1C 6.4 (H) 11/27/2019      Assessment & Plan:   Problem List Items Addressed This Visit   None   Visit Diagnoses    Trigger middle finger of right hand    -  Primary   Relevant Orders   Ambulatory referral to Hand Surgery   Atypical chest pain          No orders of the defined types were placed in this encounter.   Follow-up: No follow-ups on file.   PLAN  Chest pain resolved. Follow up with pcp as scheduled.  Refer to hand surgery for trigger finger  Patient encouraged to call clinic with any questions, comments, or concerns.  Maximiano Coss, NP

## 2020-06-08 NOTE — Telephone Encounter (Signed)
ATC patient unable to reach LM to call back office (x1) Patient doesn't have an xray might have a CT. PCC's can let her know the information for that if patient calls back.

## 2020-06-10 NOTE — Telephone Encounter (Signed)
lmtcb for pt to call back if she still has questions or concerns. Will await call back.

## 2020-06-17 ENCOUNTER — Ambulatory Visit
Admission: RE | Admit: 2020-06-17 | Discharge: 2020-06-17 | Disposition: A | Discharge status: Home / Self Care | Payer: Medicare Other | Source: Ambulatory Visit | Attending: Pulmonary Disease | Admitting: Pulmonary Disease

## 2020-06-17 DIAGNOSIS — R590 Localized enlarged lymph nodes: Secondary | ICD-10-CM | POA: Diagnosis not present

## 2020-06-17 DIAGNOSIS — D869 Sarcoidosis, unspecified: Secondary | ICD-10-CM

## 2020-06-17 DIAGNOSIS — I700 Atherosclerosis of aorta: Secondary | ICD-10-CM | POA: Diagnosis not present

## 2020-06-17 DIAGNOSIS — R0602 Shortness of breath: Secondary | ICD-10-CM | POA: Diagnosis not present

## 2020-06-17 DIAGNOSIS — I2510 Atherosclerotic heart disease of native coronary artery without angina pectoris: Secondary | ICD-10-CM | POA: Diagnosis not present

## 2020-06-20 NOTE — Progress Notes (Deleted)
Cardiology Office Note:    Date:  06/20/2020   ID:  Megan Bradford, DOB 02-09-1951, MRN [id number]  PCP:  Horald Pollen, MD  [place] Cardiologist:  No primary care provider on file.  [facility] Electrophysiologist:  None   Referring MD: Horald Pollen, *     History of Present Illness:    Megan Bradford is a 70 y.o. female with a hx of HTN, ESRD s/p renal transplant (2016) with CKD stage IIIb, and HLD who returns to clinic for follow-up of her palpitations.  During our last visit on 03/17/20, the patient was complaining of several month history of intermittent palpitations, DOE, and LE heaviness. Cardiac monitor showed runs of SVT with longest lasting 17 beats as well as occasional PACs. She declined BB at that time.TTE showed normal BiV function, G1DD, no significant valvular disease. ABIs negative for occlusive disease. High resolution CT chest with coronary calcium., but no evidence of sarcoid. She now returns to clinic for follow-up.  Past Medical History:  Diagnosis Date  . Cataract    Phreesia 11/24/2019  . Chronic kidney disease    [employer]reesia 11/24/2019  . Hypertension    [employer]reesia 11/24/2019    Past Surgical History:  Procedure Laterality Date  . CESAREAN SECTION N/A    [employer]reesia 11/24/2019  . CHOLECYSTECTOMY N/A    [employer]reesia 11/24/2019  . EYE SURGERY N/A    [employer]reesia 11/24/2019    Current Medications: No outpatient medications have been marked as taking for the 06/23/20 encounter (Appointment) with [name], MD.     Allergies:   Keflex [cephalexin] and Xanax [alprazolam]   Social History   Socioeconomic History  . Marital status: Widowed    Spouse name: Not on file  . Number of children: Not on file  . Years of education: Not on file  . Highest education level: Not on file  Occupational History  . Not on file  Tobacco Use  . Smoking status: Never Smoker  . Smokeless tobacco: Never Used  Substance and Sexual Activity  .  Alcohol use: Never  . Drug use: Never  . Sexual activity: Not Currently    Birth control/protection: None  Other Topics Concern  . Not on file  Social History Narrative  . Not on file   Social Determinants of Health   Financial Resource Strain: Not on file  Food Insecurity: Not on file  Transportation Needs: Not on file  Physical Activity: Not on file  Stress: Not on file  Social Connections: Not on file     Family History: The patient's ***family history includes Asthma in her mother; Coronary artery disease in her father; Diabetes in her mother; Hypertension in her father.  ROS:   Please see the history of present illness.    *** All other systems reviewed and are negative.  EKGs/Labs/Other Studies Reviewed:    The following studies were reviewed today: TTE 05-04-20: IMPRESSIONS  1. Left ventricular ejection fraction, by estimation, is 65 to 70%. The  left ventricle has normal function. The left ventricle has no regional  wall motion abnormalities. There is mild concentric left ventricular  hypertrophy. Left ventricular diastolic  parameters are consistent with Grade I diastolic dysfunction (impaired  relaxation).  2. Right ventricular systolic function is normal. The right ventricular  size is normal. There is normal pulmonary artery systolic pressure. The  estimated right ventricular systolic pressure is 56.2 mmHg.  3. The mitral valve is normal in structure. Mild mitral valve  regurgitation.  No evidence of mitral stenosis.  4. The aortic valve is normal in structure. Aortic valve regurgitation is  not visualized. No aortic stenosis is present.  5. The inferior vena cava is normal in size with greater than 50%  respiratory variability, suggesting right atrial pressure of 3 mmHg.   LE ABI 04/15/20: Summary:  Right: Normal examination. No evidence of arterial occlusive disease.  Medial calcifications noted.   Left: Normal examination. No evidence of arterial  occlusive disease.  Medial calcifications noted.   Right: Resting right ankle-brachial index indicates noncompressible right  lower extremity arteries. The right toe-brachial index is normal.   Left: Resting left ankle-brachial index indicates noncompressible left  lower extremity arteries. The left toe-brachial index is normal.   CT chest 06/17/20: IMPRESSION: 1. No imaging stigmata of sarcoidosis. 2. No imaging findings to suggest interstitial lung disease. 3. Moderate air trapping indicative of small airways disease. 4. Aortic atherosclerosis, in addition to left main and 2 vessel coronary artery disease. Please note that although the presence of coronary artery calcium documents the presence of coronary artery disease, the severity of this disease and any potential stenosis cannot be assessed on this non-gated CT examination. Assessment for potential risk factor modification, dietary therapy or pharmacologic therapy may be warranted, if clinically indicated. 5. Indeterminate left adrenal nodule measuring 2.5 x 1.9 cm. Follow-up adrenal protocol CT scan is recommended for further characterization. This recommendation follows ACR consensus guidelines: Management of Incidental Adrenal Masses: A White Paper of the ACR Incidental Findings Committee. J Am Coll Radiol 2017 (in Press).  PFT12/28/2020 FVC 1.76 [81%], FEV1 1.54 [90%], TLC 2.81 [62%], DLCO 13.05 [69%] Moderate restriction and diffusion impairment  Cardiac Monitor 03/2020:  Predominant rhythm was NSR with average HR 76bpm (ranging from 56bpm to 190bpm)  Patient had 250 runs of SVT with longest lasting 17 beats at average HR 117bpm. Fastest was 4 beats at rate 190bpm.  Occasional PACs (2.5%; 34547 beats); PVCs were rare  Rare couplets (<1%, 2956), rare triplets (<1% 1597)  Patient triggered events mainly correlated with SR/sinus tachycardia with PACs.  No Afib, VT or significant pauses    EKG:  EKG is *** ordered  today.  The ekg ordered today demonstrates ***  Recent Labs: 11/27/2019: ALT 7 03/18/2020: BUN 28; Creatinine, Ser 1.72; Hemoglobin 12.7; Platelets 236; Potassium 3.7; Sodium 144  Recent Lipid Panel    Component Value Date/Time   CHOL 203 (H) 11/27/2019 1019   TRIG 81 11/27/2019 1019   HDL 73 11/27/2019 1019   CHOLHDL 2.8 11/27/2019 1019   LDLCALC 116 (H) 11/27/2019 1019     Risk Assessment/Calculations:   {Does this patient have ATRIAL FIBRILLATION?[phone]}   Physical Exam:    VS:  There were no vitals taken for this visit.    Wt Readings from Last 3 Encounters:  05/18/20 204 lb 12.8 oz (92.9 kg)  04/12/20 206 lb 3.2 oz (93.5 kg)  03/31/20 204 lb 6.4 oz (92.7 kg)     GEN: *** Well nourished, well developed in no acute distress HEENT: Normal NECK: No JVD; No carotid bruits LYMPHATICS: No lymphadenopathy CARDIAC: ***RRR, no murmurs, rubs, gallops RESPIRATORY:  Clear to auscultation without rales, wheezing or rhonchi  ABDOMEN: Soft, non-tender, non-distended MUSCULOSKELETAL:  No edema; No deformity  SKIN: Warm and dry NEUROLOGIC:  Alert and oriented x 3 PSYCHIATRIC:  Normal affect   ASSESSMENT:    No diagnosis found. PLAN:    In order of problems listed above:  #Palpitations: Has intermittent palpitations  that have been ongoing for the past 4-5 months. Monitor with 250 runs of non-sustained SVT with longest episode 17 beats. Also with occasional PACs. No afib or sustained arrhythmias. -Continue to monitor -Declined BB  #DOE: TTE with normal BiV function, no significant valvular abnormalities. Declined lexiscan on last visit as she wanted to clear with her nephrologist. CT chest with coronary calcium in the LM/LAD. -Consider myoview -Continue ASA and statin -Follow-up with pulm  #LE Pain: ABIs negative for obstructive disease. -Follow-up with PCP as scheduled  #ESRD s/p renal transplant with CKD stage IIIB: -Follow-uo Nephrology -On  tacrolimus  #HTN: Well controlled at home. -Continue home amlodipine 5mg  -Continue coreg 12.5mg  BID  #HLD: Given risk factors, goal LDL<100.  -Continue crestor 20mg  daily   {Are you ordering a CV Procedure (e.g. stress test, cath, DCCV, TEE, etc)?   Press F2        :[id number]}    Medication Adjustments/Labs and Tests Ordered: Current medicines are reviewed at length with the patient today.  Concerns regarding medicines are outlined above.  No orders of the defined types were placed in this encounter.  No orders of the defined types were placed in this encounter.   There are no Patient Instructions on file for this visit.   Signed, [name], MD  06/20/2020 12:59 PM    [facility]

## 2020-06-22 NOTE — Telephone Encounter (Signed)
Will close encounter as two attempts have been made to reach pt.

## 2020-06-23 ENCOUNTER — Ambulatory Visit: Payer: Medicare Other | Admitting: Cardiology

## 2020-06-28 NOTE — Progress Notes (Signed)
Cardiology Office Note:    Date:  06/29/2020   ID:  Megan Bradford, DOB [birth year], MRN [id number]  PCP:  Horald Pollen, [place]  Cardiologist:  [name], MD  Advanced Practice Provider:  No care team member to display Electrophysiologist:  None   Referring MD: Horald Pollen, *    History of Present Illness:    Megan Bradford is a 70 y.o. female with a hx of HTN, ESRD s/p renal transplant (2016) with CKD stage IIIb, and HLD who presents for follow-up of palpitations.  During our last visit on 03/17/20, the patient was complaining of several month history of intermittent palpitations, DOE, and LE heaviness. Cardiac monitor showed runs of SVT with longest lasting 17 beats as well as occasional PACs. She declined BB at that time.TTE showed normal BiV function, G1DD, no significant valvular disease. ABIs negative for occlusive disease. High resolution CT chest with coronary calcium., but no evidence of sarcoid. She now returns to clinic for follow-up.  CT chest 06/17/20: Cardiovascular: Heart size is normal. There is no significant pericardial fluid, thickening or pericardial calcification. There is aortic atherosclerosis, as well as atherosclerosis of the great vessels of the mediastinum and the coronary arteries, including calcified atherosclerotic plaque in the left main, left anterior descending and right coronary arteries.  Today, the patient states that she feels overall well. Continues to have difficulty with shortness of breath. Palpitations improved. No chest pain. No lightheadedness or dizziness. No nausea or vomiting. No LE edema. Able to walk around and shop with her daughter over a mile with only mild SOB. Blood pressure is well controlled at home.   Past Medical History:  Diagnosis Date  . Cataract    Phreesia 11/24/2019  . Chronic kidney disease    Phreesia 11/24/2019  . Hypertension    [facility]reesia 11/24/2019     Past Surgical History:  Procedure Laterality Date  . CESAREAN SECTION N/A    [facility]reesia 11/24/2019  . CHOLECYSTECTOMY N/A    [facility]reesia 11/24/2019  . EYE SURGERY N/A    [facility]reesia 11/24/2019    Current Medications: Current Meds  Medication Sig  . amLODipine (NORVASC) 5 MG tablet Take 5 mg by mouth daily.  Marland Kitchen aspirin EC 81 MG tablet Take 81 mg by mouth daily. Swallow whole.  . carvedilol (COREG) 12.5 MG tablet Take 12.5 mg by mouth 2 (two) times daily with a meal.  . Cholecalciferol (VITAMIN D3) 10 MCG (400 UNIT) CAPS Take by mouth.  . Cyanocobalamin (VITAMIN B 12 PO) Take 1 tablet by mouth daily.  . diclofenac Sodium (VOLTAREN) 1 % GEL Apply 2 g topically 4 (four) times daily.  Marland Kitchen LORazepam (ATIVAN) 0.5 MG tablet Take one tablet by mouth prior to your procedure.  May take one additional tablet during procedure if needed.  . magnesium oxide (MAG-OX) 400 MG tablet Take 400 mg by mouth daily.  . multivitamin-lutein (OCUVITE-LUTEIN) CAPS capsule Take 1 capsule by mouth daily.  . mycophenolate (MYFORTIC) 180 MG EC tablet Take 180 mg by mouth 2 (two) times daily.  . rosuvastatin (CRESTOR) 20 MG tablet Take 1 tablet (20 mg total) by mouth daily.  . tacrolimus (PROGRAF) 1 MG capsule Take 4 capsules (4 mg total) by mouth 2 (two) times daily. (Patient taking differently: Take 1 mg by mouth. Take 4 capsules by mouth in the morning.  Take 2 capsules by mouth in the evening.)     Allergies:   Keflex [cephalexin] and  Xanax [alprazolam]   Social History   Socioeconomic History  . Marital status: Widowed    Spouse name: Not on file  . Number of children: Not on file  . Years of education: Not on file  . Highest education level: Not on file  Occupational History  . Not on file  Tobacco Use  . Smoking status: Never Smoker  . Smokeless tobacco: Never Used  Substance and Sexual Activity  . Alcohol use: Never  . Drug use: Never  . Sexual activity: Not Currently    Birth control/protection:  None  Other Topics Concern  . Not on file  Social History Narrative  . Not on file   Social Determinants of Health   Financial Resource Strain: Not on file  Food Insecurity: Not on file  Transportation Needs: Not on file  Physical Activity: Not on file  Stress: Not on file  Social Connections: Not on file     Family History: The patient's family history includes Asthma in her mother; Coronary artery disease in her father; Diabetes in her mother; Hypertension in her father.  ROS:   Please see the history of present illness.    Review of Systems  Constitutional: Negative for chills, fever and weight loss.  HENT: Negative for nosebleeds.   Eyes: Negative for blurred vision and redness.  Respiratory: Positive for shortness of breath.   Cardiovascular: Negative for chest pain, palpitations, orthopnea, claudication, leg swelling and PND.  Gastrointestinal: Negative for heartburn, melena, nausea and vomiting.  Genitourinary: Negative for frequency.  Musculoskeletal: Positive for joint pain and myalgias.  Neurological: Negative for dizziness and loss of consciousness.  Endo/Heme/Allergies: Negative for polydipsia.  Psychiatric/Behavioral: The patient is nervous/anxious.     EKGs/Labs/Other Studies Reviewed:    The following studies were reviewed today: TTE 2020/04/15: IMPRESSIONS  1. Left ventricular ejection fraction, by estimation, is 65 to 70%. The  left ventricle has normal function. The left ventricle has no regional  wall motion abnormalities. There is mild concentric left ventricular  hypertrophy. Left ventricular diastolic  parameters are consistent with Grade I diastolic dysfunction (impaired  relaxation).  2. Right ventricular systolic function is normal. The right ventricular  size is normal. There is normal pulmonary artery systolic pressure. The  estimated right ventricular systolic pressure is 94.8 mmHg.  3. The mitral valve is normal in structure. Mild mitral  valve  regurgitation. No evidence of mitral stenosis.  4. The aortic valve is normal in structure. Aortic valve regurgitation is  not visualized. No aortic stenosis is present.  5. The inferior vena cava is normal in size with greater than 50%  respiratory variability, suggesting right atrial pressure of 3 mmHg.   LE ABI 04/15/20: Summary:  Right: Normal examination. No evidence of arterial occlusive disease.  Medial calcifications noted.   Left: Normal examination. No evidence of arterial occlusive disease.  Medial calcifications noted.   Right: Resting right ankle-brachial index indicates noncompressible right  lower extremity arteries. The right toe-brachial index is normal.   Left: Resting left ankle-brachial index indicates noncompressible left  lower extremity arteries. The left toe-brachial index is normal.   CT chest 06/17/20: IMPRESSION: 1. No imaging stigmata of sarcoidosis. 2. No imaging findings to suggest interstitial lung disease. 3. Moderate air trapping indicative of small airways disease. 4. Aortic atherosclerosis, in addition to left main and 2 vessel coronary artery disease. Please note that although the presence of coronary artery calcium documents the presence of coronary artery disease, the severity of this disease  and any potential stenosis cannot be assessed on this non-gated CT examination. Assessment for potential risk factor modification, dietary therapy or pharmacologic therapy may be warranted, if clinically indicated. 5. Indeterminate left adrenal nodule measuring 2.5 x 1.9 cm. Follow-up adrenal protocol CT scan is recommended for further characterization. This recommendation follows ACR consensus guidelines: Management of Incidental Adrenal Masses: A White Paper of the ACR Incidental Findings Committee. J Am Coll Radiol 2017 (in Press).  PFT12/28/2020 FVC 1.76 [81%], FEV1 1.54 [90%], TLC 2.81 [62%], DLCO 13.05 [69%] Moderate restriction and  diffusion impairment  Cardiac Monitor 03/2020:  Predominant rhythm was NSR with average HR 76bpm (ranging from 56bpm to 190bpm)  Patient had 250 runs of SVT with longest lasting 17 beats at average HR 117bpm. Fastest was 4 beats at rate 190bpm.  Occasional PACs (2.5%; 34547 beats); PVCs were rare  Rare couplets (<1%, 2956), rare triplets (<1% 1597)  Patient triggered events mainly correlated with SR/sinus tachycardia with PACs.  No Afib, VT or significant pauses   Recent Labs: 11/27/2019: ALT 7 03/18/2020: BUN 28; Creatinine, Ser 1.72; Hemoglobin 12.7; Platelets 236; Potassium 3.7; Sodium 144  Recent Lipid Panel    Component Value Date/Time   CHOL 203 (H) 11/27/2019 1019   TRIG 81 11/27/2019 1019   HDL 73 11/27/2019 1019   CHOLHDL 2.8 11/27/2019 1019   LDLCALC 116 (H) 11/27/2019 1019     Physical Exam:    VS:  BP 134/84   Pulse 74   Ht 5' (1.524 m)   Wt 206 lb (93.4 kg)   SpO2 98%   BMI 40.23 kg/m     Wt Readings from Last 3 Encounters:  06/29/20 206 lb (93.4 kg)  05/18/20 204 lb 12.8 oz (92.9 kg)  04/12/20 206 lb 3.2 oz (93.5 kg)     GEN:  Well nourished, well developed in no acute distress HEENT: Normal NECK: No JVD; No carotid bruits CARDIAC: RRR, no murmurs, rubs, gallops RESPIRATORY:  Clear to auscultation without rales, wheezing or rhonchi  ABDOMEN: Soft, non-tender, non-distended MUSCULOSKELETAL:  No edema; No deformity  SKIN: Warm and dry NEUROLOGIC:  Alert and oriented x 3 PSYCHIATRIC:  Normal affect   ASSESSMENT:    1. SOB (shortness of breath)   2. Palpitations   3. Coronary artery disease involving native coronary artery of native heart with angina pectoris ([place])   4. Renal transplant recipient   5. Essential hypertension   6. Mixed hyperlipidemia    PLAN:    In order of problems listed above:  #Palpitations: Improved. During prior visit, she presented with intermittent palpitations that have been ongoing for the past 4-5 months.  Monitor with 250 runs of non-sustained SVT with longest episode 17 beats. Also with occasional PACs. No afib or sustained arrhythmias. Improved currently. Not on BB for now -Continue to monitor -Declined BB  #DOE: TTE with normal BiV function, no significant valvular abnormalities. Declined lexiscan on last visit as she wanted to clear with her nephrologist. CT chest with coronary calcium in the LM/LAD. Now amenable to [place] today -Amenable for lexiscan -Will need ativan to be able to undergo stress testing as has severe claustrophobia -Continue ASA and statin -Follow-up with pulm  #LE Pain: ABIs negative for obstructive disease. -Follow-up with PCP as scheduled  #ESRD s/p renal transplant with CKD stage IIIB: -Follow-uoNephrology -On tacrolimus  #HTN: Well controlled at home. -Continue home amlodipine 5mg  -Continue coreg 12.5mg  BID  #HLD: Given risk factors, goal LDL<100.  -Continue crestor 20mg  daily  #  Obesity: Discussed diet and weight loss at length today. Patient is trying to increase exercise and is interested in starting intermittent fasting. Declined referral to weight loss clinic for now but may be interested in the future  Exercise recommendations: Goal of exercising for at least 30 minutes a day, at least 5 times per week.  Please exercise to a moderate exertion.  This means that while exercising it is difficult to speak in full sentences, however you are not so short of breath that you feel you must stop, and not so comfortable that you can carry on a full conversation.  Exertion level should be approximately a 5/10, if 10 is the most exertion you can perform.  Diet recommendations: Recommend a heart healthy diet such as the Mediterranean diet.  This diet consists of plant based foods, healthy fats, lean meats, olive oil.  It suggests limiting the intake of simple carbohydrates such as white breads, pastries, and pastas.  It also limits the amount of red meat,  wine, and dairy products such as cheese that one should consume on a daily basis.   Shared Decision Making/Informed Consent The risks [chest pain, shortness of breath, cardiac arrhythmias, dizziness, blood pressure fluctuations, myocardial infarction, stroke/transient ischemic attack, nausea, vomiting, allergic reaction, radiation exposure, metallic taste sensation and life-threatening complications (estimated to be 1 in 10,000)], benefits (risk stratification, diagnosing coronary artery disease, treatment guidance) and alternatives of a nuclear stress test were discussed in detail with Ms. Grosshans and she agrees to proceed.    Medication Adjustments/Labs and Tests Ordered: Current medicines are reviewed at length with the patient today.  Concerns regarding medicines are outlined above.  Orders Placed This Encounter  Procedures  . MYOCARDIAL PERFUSION IMAGING   Meds ordered this encounter  Medications  . LORazepam (ATIVAN) 0.5 MG tablet    Sig: Take one tablet by mouth prior to your procedure.  May take one additional tablet during procedure if needed.    Dispense:  2 tablet    Refill:  0    Patient Instructions   Medication Instructions:  1) You may take Ativan 0.5mg - 1 tablet prior to your stress test.  You may take an additional tablet as needed during the procedure.  *If you need a refill on your cardiac medications before your next appointment, please call your pharmacy*   Lab Work: None If you have labs (blood work) drawn today and your tests are completely normal, you will receive your results only by: Marland Kitchen MyChart Message (if you have MyChart) OR . A paper copy in the mail If you have any lab test that is abnormal or we need to change your treatment, we will call you to review the results.   Testing/Procedures: Your physician has requested that you have a lexiscan myoview. For further information please visit [email or web address]. Please follow instruction sheet, as  given.   Follow-Up: At [facility], you and your health needs are our priority.  As part of our continuing mission to provide you with exceptional heart care, we have created designated Provider Care Teams.  These Care Teams include your primary Cardiologist (physician) and Advanced Practice Providers (APPs -  Physician Assistants and Nurse Practitioners) who all work together to provide you with the care you need, when you need it.  We recommend signing up for the patient portal called "MyChart".  Sign up information is provided on this After Visit Summary.  MyChart is used to connect with patients for Virtual Visits (Telemedicine).  Patients are able  to view lab/test results, encounter notes, upcoming appointments, etc.  Non-urgent messages can be sent to your provider as well.   To learn more about what you can do with MyChart, go to [email or web address].    Your next appointment:   3 month(s)  The format for your next appointment:   In Person  Provider:   You may see [name], MD or one of the following Advanced Practice Providers on your designated Care Team:    Richardson Dopp, [name]  Vin Winton, [place]    Other Instructions   Mediterranean Diet A Mediterranean diet refers to food and lifestyle choices that are based on the traditions of countries located on the [employer]. This way of eating has been shown to help prevent certain conditions and improve outcomes for people who have chronic diseases, like kidney disease and heart disease. What are tips for following this plan? Lifestyle  Cook and eat meals together with your family, when possible.  Drink enough fluid to keep your urine clear or pale yellow.  Be physically active every day. This includes: ? Aerobic exercise like running or swimming. ? Leisure activities like gardening, walking, or housework.  Get 7-8 hours of sleep each night.  If recommended by your health care provider, drink red wine  in moderation. This means 1 glass a day for nonpregnant women and 2 glasses a day for men. A glass of wine equals 5 oz (150 mL). Reading food labels  Check the serving size of packaged foods. For foods such as rice and pasta, the serving size refers to the amount of cooked product, not dry.  Check the total fat in packaged foods. Avoid foods that have saturated fat or trans fats.  Check the ingredients list for added sugars, such as corn syrup.   Shopping  At the grocery store, buy most of your food from the areas near the walls of the store. This includes: ? Fresh fruits and vegetables (produce). ? Grains, beans, nuts, and seeds. Some of these may be available in unpackaged forms or large amounts (in bulk). ? Fresh seafood. ? Poultry and eggs. ? Low-fat dairy products.  Buy whole ingredients instead of prepackaged foods.  Buy fresh fruits and vegetables in-season from local farmers markets.  Buy frozen fruits and vegetables in resealable bags.  If you do not have access to quality fresh seafood, buy precooked frozen shrimp or canned fish, such as tuna, salmon, or sardines.  Buy small amounts of raw or cooked vegetables, salads, or olives from the deli or salad bar at your store.  Stock your pantry so you always have certain foods on hand, such as olive oil, canned tuna, canned tomatoes, rice, pasta, and beans. Cooking  Cook foods with extra-virgin olive oil instead of using butter or other vegetable oils.  Have meat as a side dish, and have vegetables or grains as your main dish. This means having meat in small portions or adding small amounts of meat to foods like pasta or stew.  Use beans or vegetables instead of meat in common dishes like chili or lasagna.  Experiment with different cooking methods. Try roasting or broiling vegetables instead of steaming or sauteing them.  Add frozen vegetables to soups, stews, pasta, or rice.  Add nuts or seeds for added healthy fat at  each meal. You can add these to yogurt, salads, or vegetable dishes.  Marinate fish or vegetables using olive oil, lemon juice, garlic, and fresh herbs. Meal planning  Plan  to eat 1 vegetarian meal one day each week. Try to work up to 2 vegetarian meals, if possible.  Eat seafood 2 or more times a week.  Have healthy snacks readily available, such as: ? Vegetable sticks with hummus. ? [place] yogurt. ? Fruit and nut trail mix.  Eat balanced meals throughout the week. This includes: ? Fruit: 2-3 servings a day ? Vegetables: 4-5 servings a day ? Low-fat dairy: 2 servings a day ? Fish, poultry, or lean meat: 1 serving a day ? Beans and legumes: 2 or more servings a week ? Nuts and seeds: 1-2 servings a day ? Whole grains: 6-8 servings a day ? Extra-virgin olive oil: 3-4 servings a day  Limit red meat and sweets to only a few servings a month   What are my food choices?  Mediterranean diet ? Recommended  Grains: Whole-grain pasta. Brown rice. Bulgar wheat. Polenta. Couscous. Whole-wheat bread. [place].  Vegetables: Artichokes. Beets. Broccoli. Cabbage. Carrots. Eggplant. Green beans. Chard. Kale. Spinach. Onions. Leeks. Peas. Squash. Tomatoes. Peppers. Radishes.  Fruits: Apples. Apricots. Avocado. Berries. Bananas. Cherries. Dates. Figs. Grapes. Lemons. Melon. Oranges. Peaches. Plums. Pomegranate.  Meats and other protein foods: Beans. Almonds. Sunflower seeds. Pine nuts. Peanuts. [place]. Salmon. Scallops. Shrimp. [place]. Tilapia. Clams. Oysters. Eggs.  Dairy: Low-fat milk. Cheese. Greek yogurt.  Beverages: Water. Red wine. Herbal tea.  Fats and oils: Extra virgin olive oil. Avocado oil. Grape seed oil.  Sweets and desserts: [place] yogurt with honey. Baked apples. Poached pears. Trail mix.  Seasoning and other foods: Basil. Cilantro. Coriander. Cumin. Mint. Parsley. Sage. Rosemary. Tarragon. Garlic. Oregano. Thyme. Pepper. Balsalmic vinegar. Tahini. Hummus. Tomato sauce.  Olives. Mushrooms. ? Limit these  Grains: Prepackaged pasta or rice dishes. Prepackaged cereal with added sugar.  Vegetables: Deep fried potatoes (french fries).  Fruits: Fruit canned in syrup.  Meats and other protein foods: Beef. Pork. Lamb. Poultry with skin. Hot dogs. Bernie[place] Salines.  Dairy: Ice cream. Sour cream. Whole milk.  Beverages: Juice. Sugar-sweetened soft drinks. Beer. Liquor and spirits.  Fats and oils: Butter. Canola oil. Vegetable oil. Beef fat (tallow). Lard.  Sweets and desserts: Cookies. Cakes. Pies. Candy.  Seasoning and other foods: Mayonnaise. Premade sauces and marinades. The items listed may not be a complete list. Talk with your dietitian about what dietary choices are right for you. Summary  The Mediterranean diet includes both food and lifestyle choices.  Eat a variety of fresh fruits and vegetables, beans, nuts, seeds, and whole grains.  Limit the amount of red meat and sweets that you eat.  Talk with your health care provider about whether it is safe for you to drink red wine in moderation. This means 1 glass a day for nonpregnant women and 2 glasses a day for men. A glass of wine equals 5 oz (150 mL). This information is not intended to replace advice given to you by your health care provider. Make sure you discuss any questions you have with your health care provider. Document Revised: 12/30/2015 Document Reviewed: 12/23/2015 Elsevier Patient Education  2020 [employer].      Signed, [name], MD  06/29/2020 10:15 AM    [place]

## 2020-06-29 ENCOUNTER — Other Ambulatory Visit: Payer: Self-pay

## 2020-06-29 ENCOUNTER — Encounter: Payer: Self-pay | Admitting: Cardiology

## 2020-06-29 ENCOUNTER — Encounter: Payer: Self-pay | Admitting: *Deleted

## 2020-06-29 ENCOUNTER — Ambulatory Visit (INDEPENDENT_AMBULATORY_CARE_PROVIDER_SITE_OTHER): Payer: Medicare Other | Admitting: Cardiology

## 2020-06-29 VITALS — BP 134/84 | HR 74 | Ht 60.0 in | Wt 206.0 lb

## 2020-06-29 DIAGNOSIS — I25119 Atherosclerotic heart disease of native coronary artery with unspecified angina pectoris: Secondary | ICD-10-CM

## 2020-06-29 DIAGNOSIS — E782 Mixed hyperlipidemia: Secondary | ICD-10-CM | POA: Diagnosis not present

## 2020-06-29 DIAGNOSIS — N1832 Chronic kidney disease, stage 3b: Secondary | ICD-10-CM | POA: Diagnosis not present

## 2020-06-29 DIAGNOSIS — R0602 Shortness of breath: Secondary | ICD-10-CM | POA: Diagnosis not present

## 2020-06-29 DIAGNOSIS — I10 Essential (primary) hypertension: Secondary | ICD-10-CM

## 2020-06-29 DIAGNOSIS — Z940 Kidney transplant status: Secondary | ICD-10-CM

## 2020-06-29 DIAGNOSIS — R002 Palpitations: Secondary | ICD-10-CM | POA: Diagnosis not present

## 2020-06-29 MED ORDER — LORAZEPAM 0.5 MG PO TABS: ORAL_TABLET | ORAL | 0 refills | Status: DC

## 2020-06-29 NOTE — Patient Instructions (Signed)
Medication Instructions:  1) You may take Ativan 0.5mg - 1 tablet prior to your stress test.  You may take an additional tablet as needed during the procedure.  *If you need a refill on your cardiac medications before your next appointment, please call your pharmacy*   Lab Work: None If you have labs (blood work) drawn today and your tests are completely normal, you will receive your results only by: Marland Kitchen MyChart Message (if you have MyChart) OR . A paper copy in the mail If you have any lab test that is abnormal or we need to change your treatment, we will call you to review the results.   Testing/Procedures: Your physician has requested that you have a lexiscan myoview. For further information please visit [email or web address]. Please follow instruction sheet, as given.   Follow-Up: At [facility], you and your health needs are our priority.  As part of our continuing mission to provide you with exceptional heart care, we have created designated Provider Care Teams.  These Care Teams include your primary Cardiologist (physician) and Advanced Practice Providers (APPs -  Physician Assistants and Nurse Practitioners) who all work together to provide you with the care you need, when you need it.  We recommend signing up for the patient portal called "MyChart".  Sign up information is provided on this After Visit Summary.  MyChart is used to connect with patients for Virtual Visits (Telemedicine).  Patients are able to view lab/test results, encounter notes, upcoming appointments, etc.  Non-urgent messages can be sent to your provider as well.   To learn more about what you can do with MyChart, go to [email or web address].    Your next appointment:   3 month(s)  The format for your next appointment:   In Person  Provider:   You may see [name], MD or one of the following Advanced Practice Providers on your designated Care Team:    Richardson Dopp, [name]  Vin Guttenberg,  [place]    Other Instructions   Mediterranean Diet A Mediterranean diet refers to food and lifestyle choices that are based on the traditions of countries located on the [employer]. This way of eating has been shown to help prevent certain conditions and improve outcomes for people who have chronic diseases, like kidney disease and heart disease. What are tips for following this plan? Lifestyle  Cook and eat meals together with your family, when possible.  Drink enough fluid to keep your urine clear or pale yellow.  Be physically active every day. This includes: ? Aerobic exercise like running or swimming. ? Leisure activities like gardening, walking, or housework.  Get 7-8 hours of sleep each night.  If recommended by your health care provider, drink red wine in moderation. This means 1 glass a day for nonpregnant women and 2 glasses a day for men. A glass of wine equals 5 oz (150 mL). Reading food labels  Check the serving size of packaged foods. For foods such as rice and pasta, the serving size refers to the amount of cooked product, not dry.  Check the total fat in packaged foods. Avoid foods that have saturated fat or trans fats.  Check the ingredients list for added sugars, such as corn syrup.   Shopping  At the grocery store, buy most of your food from the areas near the walls of the store. This includes: ? Fresh fruits and vegetables (produce). ? Grains, beans, nuts, and seeds. Some of these may be available in unpackaged forms or  large amounts (in bulk). ? Fresh seafood. ? Poultry and eggs. ? Low-fat dairy products.  Buy whole ingredients instead of prepackaged foods.  Buy fresh fruits and vegetables in-season from local farmers markets.  Buy frozen fruits and vegetables in resealable bags.  If you do not have access to quality fresh seafood, buy precooked frozen shrimp or canned fish, such as tuna, salmon, or sardines.  Buy small amounts of raw or cooked  vegetables, salads, or olives from the deli or salad bar at your store.  Stock your pantry so you always have certain foods on hand, such as olive oil, canned tuna, canned tomatoes, rice, pasta, and beans. Cooking  Cook foods with extra-virgin olive oil instead of using butter or other vegetable oils.  Have meat as a side dish, and have vegetables or grains as your main dish. This means having meat in small portions or adding small amounts of meat to foods like pasta or stew.  Use beans or vegetables instead of meat in common dishes like chili or lasagna.  Experiment with different cooking methods. Try roasting or broiling vegetables instead of steaming or sauteing them.  Add frozen vegetables to soups, stews, pasta, or rice.  Add nuts or seeds for added healthy fat at each meal. You can add these to yogurt, salads, or vegetable dishes.  Marinate fish or vegetables using olive oil, lemon juice, garlic, and fresh herbs. Meal planning  Plan to eat 1 vegetarian meal one day each week. Try to work up to 2 vegetarian meals, if possible.  Eat seafood 2 or more times a week.  Have healthy snacks readily available, such as: ? Vegetable sticks with hummus. ? [place] yogurt. ? Fruit and nut trail mix.  Eat balanced meals throughout the week. This includes: ? Fruit: 2-3 servings a day ? Vegetables: 4-5 servings a day ? Low-fat dairy: 2 servings a day ? Fish, poultry, or lean meat: 1 serving a day ? Beans and legumes: 2 or more servings a week ? Nuts and seeds: 1-2 servings a day ? Whole grains: 6-8 servings a day ? Extra-virgin olive oil: 3-4 servings a day  Limit red meat and sweets to only a few servings a month   What are my food choices?  Mediterranean diet ? Recommended  Grains: Whole-grain pasta. Brown rice. Bulgar wheat. Polenta. Couscous. Whole-wheat bread. [place].  Vegetables: Artichokes. Beets. Broccoli. Cabbage. Carrots. Eggplant. Green beans. Chard. Kale.  Spinach. Onions. Leeks. Peas. Squash. Tomatoes. Peppers. Radishes.  Fruits: Apples. Apricots. Avocado. Berries. Bananas. Cherries. Dates. Figs. Grapes. Lemons. Melon. Oranges. Peaches. Plums. Pomegranate.  Meats and other protein foods: Beans. Almonds. Sunflower seeds. Pine nuts. Peanuts. [place]. Salmon. Scallops. Shrimp. [place]. Tilapia. Clams. Oysters. Eggs.  Dairy: Low-fat milk. Cheese. Greek yogurt.  Beverages: Water. Red wine. Herbal tea.  Fats and oils: Extra virgin olive oil. Avocado oil. Grape seed oil.  Sweets and desserts: [place] yogurt with honey. Baked apples. Poached pears. Trail mix.  Seasoning and other foods: Basil. Cilantro. Coriander. Cumin. Mint. Parsley. Sage. Rosemary. Tarragon. Garlic. Oregano. Thyme. Pepper. Balsalmic vinegar. Tahini. Hummus. Tomato sauce. Olives. Mushrooms. ? Limit these  Grains: Prepackaged pasta or rice dishes. Prepackaged cereal with added sugar.  Vegetables: Deep fried potatoes (french fries).  Fruits: Fruit canned in syrup.  Meats and other protein foods: Beef. Pork. Lamb. Poultry with skin. Hot dogs. Berniece Salines.  Dairy: Ice cream. Sour cream. Whole milk.  Beverages: Juice. Sugar-sweetened soft drinks. Beer. Liquor and spirits.  Fats and oils: Butter. Canola  oil. Vegetable oil. Beef fat (tallow). Lard.  Sweets and desserts: Cookies. Cakes. Pies. Candy.  Seasoning and other foods: Mayonnaise. Premade sauces and marinades. The items listed may not be a complete list. Talk with your dietitian about what dietary choices are right for you. Summary  The Mediterranean diet includes both food and lifestyle choices.  Eat a variety of fresh fruits and vegetables, beans, nuts, seeds, and whole grains.  Limit the amount of red meat and sweets that you eat.  Talk with your health care provider about whether it is safe for you to drink red wine in moderation. This means 1 glass a day for nonpregnant women and 2 glasses a day for men. A glass of wine  equals 5 oz (150 mL). This information is not intended to replace advice given to you by your health care provider. Make sure you discuss any questions you have with your health care provider. Document Revised: 12/30/2015 Document Reviewed: 12/23/2015 Elsevier Patient Education  [place].

## 2020-07-01 ENCOUNTER — Other Ambulatory Visit: Payer: Self-pay | Admitting: Cardiology

## 2020-07-01 DIAGNOSIS — B258 Other cytomegaloviral diseases: Secondary | ICD-10-CM | POA: Diagnosis not present

## 2020-07-01 DIAGNOSIS — R7303 Prediabetes: Secondary | ICD-10-CM | POA: Diagnosis not present

## 2020-07-01 DIAGNOSIS — N186 End stage renal disease: Secondary | ICD-10-CM | POA: Diagnosis not present

## 2020-07-01 DIAGNOSIS — D84821 Immunodeficiency due to drugs: Secondary | ICD-10-CM | POA: Diagnosis not present

## 2020-07-01 DIAGNOSIS — Z940 Kidney transplant status: Secondary | ICD-10-CM | POA: Diagnosis not present

## 2020-07-01 DIAGNOSIS — E785 Hyperlipidemia, unspecified: Secondary | ICD-10-CM | POA: Diagnosis not present

## 2020-07-01 DIAGNOSIS — R809 Proteinuria, unspecified: Secondary | ICD-10-CM | POA: Diagnosis not present

## 2020-07-01 DIAGNOSIS — Z79899 Other long term (current) drug therapy: Secondary | ICD-10-CM | POA: Diagnosis not present

## 2020-07-01 DIAGNOSIS — R0609 Other forms of dyspnea: Secondary | ICD-10-CM | POA: Diagnosis not present

## 2020-07-01 DIAGNOSIS — R079 Chest pain, unspecified: Secondary | ICD-10-CM

## 2020-07-01 DIAGNOSIS — I10 Essential (primary) hypertension: Secondary | ICD-10-CM | POA: Diagnosis not present

## 2020-07-01 DIAGNOSIS — D8989 Other specified disorders involving the immune mechanism, not elsewhere classified: Secondary | ICD-10-CM | POA: Diagnosis not present

## 2020-07-01 DIAGNOSIS — I120 Hypertensive chronic kidney disease with stage 5 chronic kidney disease or end stage renal disease: Secondary | ICD-10-CM | POA: Diagnosis not present

## 2020-07-07 ENCOUNTER — Telehealth (HOSPITAL_COMMUNITY): Payer: Self-pay

## 2020-07-07 NOTE — Telephone Encounter (Signed)
Spoke with the patient, detailed instructions given. She stated that she would be here for her test. Asked to call back with any questions. S.Philip Eckersley EMTP 

## 2020-07-08 ENCOUNTER — Other Ambulatory Visit: Payer: Self-pay

## 2020-07-08 ENCOUNTER — Ambulatory Visit (HOSPITAL_COMMUNITY): Payer: Medicare Other | Attending: Cardiology

## 2020-07-08 DIAGNOSIS — R0602 Shortness of breath: Secondary | ICD-10-CM | POA: Insufficient documentation

## 2020-07-08 LAB — MYOCARDIAL PERFUSION IMAGING
LV dias vol: 53 mL (ref 46–106)
LV sys vol: 17 mL
Peak HR: 85 {beats}/min
Rest HR: 63 {beats}/min
SDS: 1
SRS: 0
SSS: 1
TID: 1.05

## 2020-07-08 MED ORDER — TECHNETIUM TC 99M TETROFOSMIN IV KIT
32.7000 | PACK | Freq: Once | INTRAVENOUS | Status: AC | PRN
  Administered 2020-07-08: 32.7 via INTRAVENOUS
  Filled 2020-07-08: qty 33

## 2020-07-08 MED ORDER — TECHNETIUM TC 99M TETROFOSMIN IV KIT
10.9000 | PACK | Freq: Once | INTRAVENOUS | Status: AC | PRN
  Administered 2020-07-08: 10.9 via INTRAVENOUS
  Filled 2020-07-08: qty 11

## 2020-07-08 MED ORDER — REGADENOSON 0.4 MG/5ML IV SOLN
0.4000 mg | Freq: Once | INTRAVENOUS | Status: AC
  Administered 2020-07-08: 0.4 mg via INTRAVENOUS

## 2020-07-09 ENCOUNTER — Telehealth: Payer: Self-pay

## 2020-07-09 NOTE — Telephone Encounter (Signed)
-----   Message from [name], MD sent at 07/09/2020  1:11 PM EST ----- Her stress test is completely normal with no evidence of decreased blood flow to the heart muscle. This is great news and means her shortness of breath is not due to blockages in her heart arteries.

## 2020-07-09 NOTE — Telephone Encounter (Signed)
Left patient a message to call office back regarding results.  

## 2020-07-12 ENCOUNTER — Telehealth: Payer: Self-pay

## 2020-07-12 NOTE — Telephone Encounter (Signed)
-----   Message from [name], MD sent at 07/09/2020  1:11 PM EST ----- Her stress test is completely normal with no evidence of decreased blood flow to the heart muscle. This is great news and means her shortness of breath is not due to blockages in her heart arteries.

## 2020-07-12 NOTE — Telephone Encounter (Signed)
The patient has been notified of the result and verbalized understanding.  All questions (if any) were answered. Wilma Flavin, RN 07/12/2020 9:58 AM

## 2020-07-15 DIAGNOSIS — D61811 Other drug-induced pancytopenia: Secondary | ICD-10-CM | POA: Diagnosis not present

## 2020-07-15 DIAGNOSIS — T451X5A Adverse effect of antineoplastic and immunosuppressive drugs, initial encounter: Secondary | ICD-10-CM | POA: Diagnosis not present

## 2020-07-15 DIAGNOSIS — Z940 Kidney transplant status: Secondary | ICD-10-CM | POA: Diagnosis not present

## 2020-07-30 DIAGNOSIS — Z79899 Other long term (current) drug therapy: Secondary | ICD-10-CM | POA: Diagnosis not present

## 2020-07-30 DIAGNOSIS — D849 Immunodeficiency, unspecified: Secondary | ICD-10-CM | POA: Diagnosis not present

## 2020-07-30 DIAGNOSIS — Z8619 Personal history of other infectious and parasitic diseases: Secondary | ICD-10-CM | POA: Diagnosis not present

## 2020-07-30 DIAGNOSIS — Z940 Kidney transplant status: Secondary | ICD-10-CM | POA: Diagnosis not present

## 2020-07-30 DIAGNOSIS — I10 Essential (primary) hypertension: Secondary | ICD-10-CM | POA: Diagnosis not present

## 2020-07-30 DIAGNOSIS — Z4822 Encounter for aftercare following kidney transplant: Secondary | ICD-10-CM | POA: Diagnosis not present

## 2020-07-30 DIAGNOSIS — R809 Proteinuria, unspecified: Secondary | ICD-10-CM | POA: Diagnosis not present

## 2020-07-30 DIAGNOSIS — E785 Hyperlipidemia, unspecified: Secondary | ICD-10-CM | POA: Diagnosis not present

## 2020-07-30 DIAGNOSIS — R7303 Prediabetes: Secondary | ICD-10-CM | POA: Diagnosis not present

## 2020-07-30 DIAGNOSIS — B348 Other viral infections of unspecified site: Secondary | ICD-10-CM | POA: Diagnosis not present

## 2020-07-30 DIAGNOSIS — R0600 Dyspnea, unspecified: Secondary | ICD-10-CM | POA: Diagnosis not present

## 2020-08-05 ENCOUNTER — Encounter: Payer: Self-pay | Admitting: Physician Assistant

## 2020-08-12 ENCOUNTER — Telehealth: Payer: Self-pay | Admitting: Emergency Medicine

## 2020-08-12 NOTE — Telephone Encounter (Signed)
[facility] called and stated they need a bone dexa order for the patient who has an appointment tomorrow. Informed Dr.Sagardia doesn't start until Monday 4/4 at this office. If another provider is unable to to complete order please let me them know so they can have patient reschedule.   [phone] Ebony Hail

## 2020-08-13 DIAGNOSIS — Z1231 Encounter for screening mammogram for malignant neoplasm of breast: Secondary | ICD-10-CM | POA: Diagnosis not present

## 2020-08-13 LAB — HM MAMMOGRAPHY

## 2020-08-13 NOTE — Telephone Encounter (Signed)
Called [facility] left message in voice for Megan Bradford, advised her Dr Rosezella Rumpf will be starting Monday 4th. This will be his first day at [facility] [facility].

## 2020-08-17 ENCOUNTER — Telehealth: Payer: Self-pay | Admitting: *Deleted

## 2020-08-17 ENCOUNTER — Telehealth: Payer: Self-pay

## 2020-08-17 DIAGNOSIS — N2581 Secondary hyperparathyroidism of renal origin: Secondary | ICD-10-CM | POA: Diagnosis not present

## 2020-08-17 DIAGNOSIS — N1832 Chronic kidney disease, stage 3b: Secondary | ICD-10-CM | POA: Diagnosis not present

## 2020-08-17 DIAGNOSIS — Z940 Kidney transplant status: Secondary | ICD-10-CM | POA: Diagnosis not present

## 2020-08-17 DIAGNOSIS — R809 Proteinuria, unspecified: Secondary | ICD-10-CM | POA: Diagnosis not present

## 2020-08-17 DIAGNOSIS — I129 Hypertensive chronic kidney disease with stage 1 through stage 4 chronic kidney disease, or unspecified chronic kidney disease: Secondary | ICD-10-CM | POA: Diagnosis not present

## 2020-08-17 DIAGNOSIS — Z79899 Other long term (current) drug therapy: Secondary | ICD-10-CM | POA: Diagnosis not present

## 2020-08-17 NOTE — Telephone Encounter (Signed)
Bethann Punches, [place]   08/17/20 9:20 AM Note On 08/16/2020, faxed signed order for Bone density to [facility]. Confirmation at 1:08 pm.

## 2020-08-17 NOTE — Telephone Encounter (Signed)
Copied from [place] [id number]. Topic: Referral - Request for Referral >> Aug 05, 2020 11:16 AM Celene Kras wrote: Has patient seen PCP for this complaint? Yes.   *If NO, is insurance requiring patient see PCP for this issue before PCP can refer them? Referral for which specialty: Mammography Preferred provider/office: [facility] Reason for referral: Pt called stating that she already has an appt coming up on 08/13/20 at 9am for a bone density test and mammogram. Please advise.

## 2020-08-17 NOTE — Telephone Encounter (Signed)
On 08/16/2020, faxed signed order for Bone density to [facility]. Confirmation at 1:08 pm.

## 2020-08-18 NOTE — Telephone Encounter (Signed)
When Dr Mitchel Honour returned order was signed and faxed to [facility].

## 2020-08-20 ENCOUNTER — Ambulatory Visit: Payer: Medicare Other | Admitting: Physician Assistant

## 2020-08-29 ENCOUNTER — Encounter: Payer: Self-pay | Admitting: Emergency Medicine

## 2020-08-29 NOTE — Progress Notes (Signed)
BP Readings from Last 3 Encounters:  06/29/20 134/84  05/18/20 (!) 142/82  04/12/20 126/80   Lab Results  Component Value Date   CREATININE 1.72 (H) 03/18/2020   BUN 28 (H) 03/18/2020   NA 144 03/18/2020   K 3.7 03/18/2020   CL 109 03/18/2020   CO2 24 03/18/2020   Megan Bradford 70 y.o.   Chief Complaint  Patient presents with  . Medication Refill    Tacrolimus  . Hypertension    Follow up 6 months   IMPRESSION: 1. No imaging stigmata of sarcoidosis. 2. No imaging findings to suggest interstitial lung disease. 3. Moderate air trapping indicative of small airways disease. 4. Aortic atherosclerosis, in addition to left main and 2 vessel coronary artery disease. Please note that although the presence of coronary artery calcium documents the presence of coronary artery disease, the severity of this disease and any potential stenosis cannot be assessed on this non-gated CT examination. Assessment for potential risk factor modification, dietary therapy or pharmacologic therapy may be warranted, if clinically indicated. 5. Indeterminate left adrenal nodule measuring 2.5 x 1.9 cm. Follow-up adrenal protocol CT scan is recommended for further characterization. This recommendation follows ACR consensus guidelines: Management of Incidental Adrenal Masses: A White Paper of the ACR Incidental Findings Committee. J Am Coll Radiol 2017 (in press).  Aortic Atherosclerosis (ICD10-I70.0).   Electronically Signed   By: Vinnie Langton M.D.   On: 06/17/2020 12:21 HISTORY OF PRESENT ILLNESS: This is a 70 y.o. female here for follow-up of chronic medical problems. Kidney transplantation with history of hypertension, dyslipidemia, and prediabetes on multiple medications. Medication list reviewed with patient. Recently saw her cardiologist.  CT of the chest reviewed and findings noted. Clinically stable. Also complaining of chronic pain to left hip and multiple skin lesions.   Needs dermatology referral. No other complaints or medical concerns today. 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 She presents today to establish care.   Plan: Renal: initial cause of ESRD unknown per patient. S/p DCD DDKT 08/20/2014. Patient states she is unsure exactly why she is here today, but states her nephrologist mentioned possible biopsy or BK viremia. Reviewed records to review from [facility]. Baseline creatinine 1.6-1.8 per pt. Creatinine is within her baseline today. PT was discussed with Dr. Billey Chang. . Pt was referred for possible biopsy due to proteinuria. Last UPC available was 2.2 grams on 04/22/2020. There is no additional UPC to review. Last renal US was 01/07/2020 which showed "transplanted kidney located in right iliac fossa. 8.6 cm. Mild transplant hydro. No renal mass or shadowing calcification. No RAS". Records from [facility] have also been requested.   Allosure at initial visit was <0.12% and creatinine was noted to be at baseline.   Creatinine (MG/DL)  Date Value  07/01/2020 1.66 (H)   UPC: Patient was unable to provide urine sample at last visit as she had just voided. We are checking UPC today. UPC with 2.2 grams noted on 04/22/2020 with primary nephrologist.  Virals: will check BK today. CMV-PCR and EBV-PCR undetectable on 07/15/20 labs.   Immunosuppression: Continue current regimen of Prograf and MPA 180 mg BID (?reduced due to history of BK viremia, ? Diarrhea, ?leukopenia). Patient states she has been on this dose of MPA for some time. She reports today that she did have issues with diarrhea and low WBC count in her 2nd year post transplant. She also reports she was initially on cellcept  but transitioned to myfortic, possibly for diarrhea.  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 Checking BK-S today.  Volume status: Euvolemic.   ID prophylaxis: not on  ID ppx.   HTN: assessed as stable. Continue current regimen.  Prediabetes: last A1c 6.4% per records that were received after visit. Would recommend dietary and exercise modifications. Pt following with PCP.  DOE: ongoing for 1.5 years. Pt following closely with Dr. Johney Frame from cardiology. Recent TTE without evidence of acute pathology.   Hyperlipidemia: Continue Crestor.   Follow up: Annually and as needed pending labs today.   Depending on patient preference, staffing availability and clinic volume on scheduled clinic day, the patient may be changed to an RCE (return care extender), Nurse Visit, or Lab only visit if deemed clinically stable.  Electronically signed by: Dereck Ligas [place], [name] 07/30/2020 11:02 AM   Cardiology visit on 04/28/2021 as follows: ASSESSMENT:    1. SOB (shortness of breath)   2. Palpitations   3. Coronary artery disease involving native coronary artery of native heart with angina pectoris ([place])   4. Renal transplant recipient   5. Essential hypertension   6. Mixed hyperlipidemia    PLAN:    In order of problems listed above:  #Palpitations: Improved. During prior visit, she presented with intermittent palpitations that have been ongoing for the past 4-5 months.Monitor with 250 runs of non-sustained SVT with longest episode 17 beats. Also with occasional PACs. No afib or sustained arrhythmias. Improved currently. Not on BB for now -Continue to monitor -Declined BB  #DOE: TTE with normal BiV function, no significant valvular abnormalities. Declined lexiscan on last visit as she wanted to clear with her nephrologist. CT chest with coronary calcium in the LM/LAD. Now amenable to [place] today -Amenable for lexiscan -Will need ativan to be able to undergo stress testing as has severe claustrophobia -Continue ASA and statin -Follow-up with pulm  #LE Pain: ABIs negative for obstructive disease. 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 -Continue home amlodipine 5mg  -Continue coreg 12.5mg  BID  #HLD: Given risk factors, goal LDL<100.  -Continue crestor 20mg  daily  #Obesity: Discussed diet and weight loss at length today. Patient is trying to increase exercise and is interested in starting intermittent fasting. Declined referral to weight loss clinic for now but may be interested in the future  Exercise recommendations: Goal of exercising for at least 30 minutes a day, at least 5 times per week.  Please exercise to a moderate exertion.  This means that while exercising it is difficult to speak in full sentences, however you are not so short of breath that you feel you must stop, and not so comfortable that you can carry on a full conversation.  Exertion level should be approximately a 5/10, if 10 is the most exertion you can perform.  Diet recommendations: Recommend a heart healthy diet such as the Mediterranean diet.  This diet consists of plant based foods, healthy fats, lean meats, olive oil.  It suggests limiting the intake of simple carbohydrates such as white breads, pastries, and pastas.  It also limits the amount of red meat, wine, and dairy products such as cheese that one should consume on a daily basis.    HPI   Prior to Admission medications   Medication Sig Start Date End Date Taking? Authorizing Provider  amLODipine (NORVASC) 5 MG tablet Take 5 mg by mouth  daily.   Yes [provider]  aspirin EC 81 MG tablet Take 81 mg by mouth daily. Swallow whole.   Yes [provider]  carvedilol (COREG) 12.5 MG tablet Take 12.5 mg by mouth 2 (two) times daily with a meal.   Yes [provider]  Cholecalciferol (VITAMIN D3) 10 MCG (400 UNIT) CAPS Take by mouth.   Yes [provider]  Cyanocobalamin (VITAMIN B 12 PO) Take 1 tablet by mouth daily.   Yes [provider]  diclofenac Sodium (VOLTAREN) 1 % GEL Apply 2 g topically 4 (four) times daily. 12/01/19  Yes Wieters, Hallie C, [name]  LORazepam (ATIVAN) 0.5 MG tablet Take one tablet by mouth prior to your procedure.  May take one additional tablet during procedure if needed. 06/29/20  Yes [name], MD  magnesium oxide (MAG-OX) 400 MG tablet Take 400 mg by mouth daily.   Yes [provider]  multivitamin-lutein (OCUVITE-LUTEIN) CAPS capsule Take 1 capsule by mouth daily.   Yes [provider]  mycophenolate (MYFORTIC) 180 MG EC tablet Take 180 mg by mouth 2 (two) times daily.   Yes [provider]  rosuvastatin (CRESTOR) 20 MG tablet Take 1 tablet (20 mg total) by mouth daily. 03/17/20  Yes [name], MD  tacrolimus (PROGRAF) 1 MG capsule Take 4 capsules (4 mg total) by mouth 2 (two) times daily. Patient taking differently: Take 1 mg by mouth. Take 4 capsules by mouth in the morning.  Take 2 capsules by mouth in the evening. 03/01/20  Yes Horald Pollen, MD    Allergies  Allergen Reactions  . Keflex [Cephalexin] Anaphylaxis  . Xanax [Alprazolam] Other (See Comments)    Per patient it does not make me "violent"    Patient Active Problem List   Diagnosis Date Noted  . Stage 3b chronic kidney disease ([place]) 12/17/2019  . History of kidney transplant 11/27/2019  . Essential hypertension 11/27/2019  . Dyslipidemia 11/27/2019  . Prediabetes 11/27/2019  . Body mass index (BMI) of 38.0-38.9 in adult 11/27/2019    Past Medical History:  Diagnosis Date  . Cataract    Phreesia 11/24/2019  . Chronic kidney disease    Phreesia 11/24/2019  . Hypertension    Phreesia 11/24/2019    Past Surgical History:  Procedure Laterality Date  . CESAREAN SECTION N/A    Phreesia 11/24/2019  . CHOLECYSTECTOMY N/A    Phreesia 11/24/2019  . EYE SURGERY N/A    Phreesia 11/24/2019    Social History   Socioeconomic History  . Marital status:  Widowed    Spouse name: Not on file  . Number of children: Not on file  . Years of education: Not on file  . Highest education level: Not on file  Occupational History  . Not on file  Tobacco Use  . Smoking status: Never Smoker  . Smokeless tobacco: Never Used  Substance and Sexual Activity  . Alcohol use: Never  . Drug use: Never  . Sexual activity: Not Currently    Birth control/protection: None  Other Topics Concern  . Not on file  Social History Narrative  . Not on file   Social Determinants of Health   Financial Resource Strain: Not on file  Food Insecurity: Not on file  Transportation Needs: Not on file  Physical Activity: Not on file  Stress: Not on file  Social Connections: Not on file  Intimate Partner Violence: Not on file    Family History  Problem  Relation Age of Onset  . Diabetes Mother   . Asthma Mother   . Hypertension Father   . Coronary artery disease Father      Review of Systems  Constitutional: Negative.  Negative for chills and fever.  HENT: Negative.  Negative for congestion and sore throat.   Respiratory: Negative.  Negative for cough and shortness of breath.   Cardiovascular: Negative.  Negative for chest pain.  Gastrointestinal: Negative for abdominal pain, diarrhea, nausea and vomiting.  Genitourinary: Negative.  Negative for dysuria and hematuria.  Musculoskeletal: Positive for joint pain (Chronic left hip pain).  Skin: Negative.  Negative for rash.       Multiple dark lesions  Neurological: Negative for dizziness and headaches.  All other systems reviewed and are negative.   Today's Vitals   08/30/20 0910  BP: 126/84  Pulse: 68  Temp: 98.3 F (36.8 C)  TempSrc: Oral  SpO2: 98%  Weight: 204 lb 9.6 oz (92.8 kg)  Height: 5' (1.524 m)   Body mass index is 39.96 kg/m. Wt Readings from Last 3 Encounters:  08/30/20 204 lb 9.6 oz (92.8 kg)  07/08/20 206 lb (93.4 kg)  06/29/20 206 lb (93.4 kg)    Physical Exam Vitals  reviewed.  Constitutional:      Appearance: Normal appearance.  HENT:     Head: Normocephalic.  Eyes:     Extraocular Movements: Extraocular movements intact.     Pupils: Pupils are equal, round, and reactive to light.  Cardiovascular:     Rate and Rhythm: Normal rate and regular rhythm.     Pulses: Normal pulses.     Heart sounds: Normal heart sounds.  Pulmonary:     Effort: Pulmonary effort is normal.     Breath sounds: Normal breath sounds.  Musculoskeletal:        General: Normal range of motion.     Cervical back: Normal range of motion and neck supple.  Skin:    General: Skin is warm and dry.     Capillary Refill: Capillary refill takes less than 2 seconds.  Neurological:     General: No focal deficit present.     Mental Status: She is alert and oriented to person, place, and time.  Psychiatric:        Mood and Affect: Mood normal.        Behavior: Behavior normal.      ASSESSMENT & PLAN: Clinically stable.  No medical concerns identified during this visit. Continue present medications.  No changes. Continue follow-up with different specialists as scheduled. Follow-up with me in 6 months. Kinda was seen today for medication refill and hypertension.  Diagnoses and all orders for this visit:  Essential hypertension  Stage 3b chronic kidney disease ([place])  History of kidney transplant -     tacrolimus (PROGRAF) 1 MG capsule; Take 4 capsules by mouth in the morning.  Take 2 capsules by mouth in the evening.  Dyslipidemia  Prediabetes  Aortic atherosclerosis ([facility])  Change in multiple pigmented skin lesions -     Ambulatory referral to Dermatology  Left hip pain -     Ambulatory referral to Orthopedic Surgery  Coronary artery disease of native artery of native heart with stable angina pectoris ([place])  Immunosuppression [facility])    Patient Instructions   Health Maintenance After Age 80 After age 38, you are at a higher risk for certain long-term diseases  and infections as well as injuries from falls. Falls are a major cause of  broken bones and head injuries in people who are older than age 62. Getting regular preventive care can help to keep you healthy and well. Preventive care includes getting regular testing and making lifestyle changes as recommended by your health care provider. Talk with your health care provider about:  Which screenings and tests you should have. A screening is a test that checks for a disease when you have no symptoms.  A diet and exercise plan that is right for you. What should I know about screenings and tests to prevent falls? Screening and testing are the best ways to find a health problem early. Early diagnosis and treatment give you the best chance of managing medical conditions that are common after age 74. Certain conditions and lifestyle choices may make you more likely to have a fall. Your health care provider may recommend:  Regular vision checks. Poor vision and conditions such as cataracts can make you more likely to have a fall. If you wear glasses, make sure to get your prescription updated if your vision changes.  Medicine review. Work with your health care provider to regularly review all of the medicines you are taking, including over-the-counter medicines. Ask your health care provider about any side effects that may make you more likely to have a fall. Tell your health care provider if any medicines that you take make you feel dizzy or sleepy.  Osteoporosis screening. Osteoporosis is a condition that causes the bones to get weaker. This can make the bones weak and cause them to break more easily.  Blood pressure screening. Blood pressure changes and medicines to control blood pressure can make you feel dizzy.  Strength and balance checks. Your health care provider may recommend certain tests to check your strength and balance while standing, walking, or changing positions.  Foot health exam. Foot pain  and numbness, as well as not wearing proper footwear, can make you more likely to have a fall.  Depression screening. You may be more likely to have a fall if you have a fear of falling, feel emotionally low, or feel unable to do activities that you used to do.  Alcohol use screening. Using too much alcohol can affect your balance and may make you more likely to have a fall. What actions can I take to lower my risk of falls? General instructions  Talk with your health care provider about your risks for falling. Tell your health care provider if: ? You fall. Be sure to tell your health care provider about all falls, even ones that seem minor. ? You feel dizzy, sleepy, or off-balance.  Take over-the-counter and prescription medicines only as told by your health care provider. These include any supplements.  Eat a healthy diet and maintain a healthy weight. A healthy diet includes low-fat dairy products, low-fat (lean) meats, and fiber from whole grains, beans, and lots of fruits and vegetables. Home safety  Remove any tripping hazards, such as rugs, cords, and clutter.  Install safety equipment such as grab bars in bathrooms and safety rails on stairs.  Keep rooms and walkways well-lit. Activity  Follow a regular exercise program to stay fit. This will help you maintain your balance. Ask your health care provider what types of exercise are appropriate for you.  If you need a cane or walker, use it as recommended by your health care provider.  Wear supportive shoes that have nonskid soles.   Lifestyle  Do not drink alcohol if your health care provider tells you  not to drink.  If you drink alcohol, limit how much you have: ? 0-1 drink a day for women. ? 0-2 drinks a day for men.  Be aware of how much alcohol is in your drink. In the [place] one drink equals one typical bottle of beer (12 oz), one-half glass of wine (5 oz), or one shot of hard liquor (1 oz).  Do not use any products  that contain nicotine or tobacco, such as cigarettes and e-cigarettes. If you need help quitting, ask your health care provider. Summary  Having a healthy lifestyle and getting preventive care can help to protect your health and wellness after age 23.  Screening and testing are the best way to find a health problem early and help you avoid having a fall. Early diagnosis and treatment give you the best chance for managing medical conditions that are more common for people who are older than age 60.  Falls are a major cause of broken bones and head injuries in people who are older than age 38. Take precautions to prevent a fall at home.  Work with your health care provider to learn what changes you can make to improve your health and wellness and to prevent falls. This information is not intended to replace advice given to you by your health care provider. Make sure you discuss any questions you have with your health care provider. Document Revised: 08/22/2018 Document Reviewed: 03/14/2017 Elsevier Patient Education  2021 [place], [place] [facility]

## 2020-08-30 ENCOUNTER — Telehealth: Payer: Self-pay | Admitting: Cardiology

## 2020-08-30 ENCOUNTER — Ambulatory Visit (INDEPENDENT_AMBULATORY_CARE_PROVIDER_SITE_OTHER): Payer: Medicare Other | Admitting: Emergency Medicine

## 2020-08-30 ENCOUNTER — Other Ambulatory Visit: Payer: Self-pay

## 2020-08-30 VITALS — BP 126/84 | HR 68 | Temp 98.3°F | Ht 60.0 in | Wt 204.6 lb

## 2020-08-30 DIAGNOSIS — E785 Hyperlipidemia, unspecified: Secondary | ICD-10-CM

## 2020-08-30 DIAGNOSIS — I700 Atherosclerosis of aorta: Secondary | ICD-10-CM

## 2020-08-30 DIAGNOSIS — Z940 Kidney transplant status: Secondary | ICD-10-CM | POA: Diagnosis not present

## 2020-08-30 DIAGNOSIS — I25118 Atherosclerotic heart disease of native coronary artery with other forms of angina pectoris: Secondary | ICD-10-CM

## 2020-08-30 DIAGNOSIS — D849 Immunodeficiency, unspecified: Secondary | ICD-10-CM | POA: Diagnosis not present

## 2020-08-30 DIAGNOSIS — R7303 Prediabetes: Secondary | ICD-10-CM

## 2020-08-30 DIAGNOSIS — L819 Disorder of pigmentation, unspecified: Secondary | ICD-10-CM | POA: Diagnosis not present

## 2020-08-30 DIAGNOSIS — I10 Essential (primary) hypertension: Secondary | ICD-10-CM | POA: Diagnosis not present

## 2020-08-30 DIAGNOSIS — M25552 Pain in left hip: Secondary | ICD-10-CM | POA: Diagnosis not present

## 2020-08-30 DIAGNOSIS — I2510 Atherosclerotic heart disease of native coronary artery without angina pectoris: Secondary | ICD-10-CM | POA: Insufficient documentation

## 2020-08-30 DIAGNOSIS — N1832 Chronic kidney disease, stage 3b: Secondary | ICD-10-CM | POA: Diagnosis not present

## 2020-08-30 MED ORDER — TACROLIMUS 1 MG PO CAPS: ORAL_CAPSULE | ORAL | 3 refills | Status: DC

## 2020-08-30 NOTE — Telephone Encounter (Signed)
Pt is returning call from earlier today regarding Triage Notes. Please advise

## 2020-08-30 NOTE — Patient Instructions (Signed)
Health Maintenance After Age 69 After age 69, you are at a higher risk for certain long-term diseases and infections as well as injuries from falls. Falls are a major cause of broken bones and head injuries in people who are older than age 69. Getting regular preventive care can help to keep you healthy and well. Preventive care includes getting regular testing and making lifestyle changes as recommended by your health care provider. Talk with your health care provider about:  Which screenings and tests you should have. A screening is a test that checks for a disease when you have no symptoms.  A diet and exercise plan that is right for you. What should I know about screenings and tests to prevent falls? Screening and testing are the best ways to find a health problem early. Early diagnosis and treatment give you the best chance of managing medical conditions that are common after age 69. Certain conditions and lifestyle choices may make you more likely to have a fall. Your health care provider may recommend:  Regular vision checks. Poor vision and conditions such as cataracts can make you more likely to have a fall. If you wear glasses, make sure to get your prescription updated if your vision changes.  Medicine review. Work with your health care provider to regularly review all of the medicines you are taking, including over-the-counter medicines. Ask your health care provider about any side effects that may make you more likely to have a fall. Tell your health care provider if any medicines that you take make you feel dizzy or sleepy.  Osteoporosis screening. Osteoporosis is a condition that causes the bones to get weaker. This can make the bones weak and cause them to break more easily.  Blood pressure screening. Blood pressure changes and medicines to control blood pressure can make you feel dizzy.  Strength and balance checks. Your health care provider may recommend certain tests to check your  strength and balance while standing, walking, or changing positions.  Foot health exam. Foot pain and numbness, as well as not wearing proper footwear, can make you more likely to have a fall.  Depression screening. You may be more likely to have a fall if you have a fear of falling, feel emotionally low, or feel unable to do activities that you used to do.  Alcohol use screening. Using too much alcohol can affect your balance and may make you more likely to have a fall. What actions can I take to lower my risk of falls? General instructions  Talk with your health care provider about your risks for falling. Tell your health care provider if: ? You fall. Be sure to tell your health care provider about all falls, even ones that seem minor. ? You feel dizzy, sleepy, or off-balance.  Take over-the-counter and prescription medicines only as told by your health care provider. These include any supplements.  Eat a healthy diet and maintain a healthy weight. A healthy diet includes low-fat dairy products, low-fat (lean) meats, and fiber from whole grains, beans, and lots of fruits and vegetables. Home safety  Remove any tripping hazards, such as rugs, cords, and clutter.  Install safety equipment such as grab bars in bathrooms and safety rails on stairs.  Keep rooms and walkways well-lit. Activity  Follow a regular exercise program to stay fit. This will help you maintain your balance. Ask your health care provider what types of exercise are appropriate for you.  If you need a cane or walker,   use it as recommended by your health care provider.  Wear supportive shoes that have nonskid soles.   Lifestyle  Do not drink alcohol if your health care provider tells you not to drink.  If you drink alcohol, limit how much you have: ? 0-1 drink a day for women. ? 0-2 drinks a day for men.  Be aware of how much alcohol is in your drink. In the [place] one drink equals one typical bottle of beer (12  oz), one-half glass of wine (5 oz), or one shot of hard liquor (1 oz).  Do not use any products that contain nicotine or tobacco, such as cigarettes and e-cigarettes. If you need help quitting, ask your health care provider. Summary  Having a healthy lifestyle and getting preventive care can help to protect your health and wellness after age 69.  Screening and testing are the best way to find a health problem early and help you avoid having a fall. Early diagnosis and treatment give you the best chance for managing medical conditions that are more common for people who are older than age 69.  Falls are a major cause of broken bones and head injuries in people who are older than age 69. Take precautions to prevent a fall at home.  Work with your health care provider to learn what changes you can make to improve your health and wellness and to prevent falls. This information is not intended to replace advice given to you by your health care provider. Make sure you discuss any questions you have with your health care provider. Document Revised: 08/22/2018 Document Reviewed: 03/14/2017 Elsevier Patient Education  2021 Elsevier Inc.  

## 2020-08-30 NOTE — Telephone Encounter (Signed)
Returned call to patient who states she had a message on her voice mail to call our office for results. She was not aware of any pending results for her so she was confused. I also could not find any outstanding results that have not been reported. She states that complete messages may be left on her voice mail as indicated on her DPR. She thanked me for returning her call.

## 2020-08-31 ENCOUNTER — Encounter: Payer: Self-pay | Admitting: Physician Assistant

## 2020-08-31 ENCOUNTER — Encounter: Payer: Self-pay | Admitting: *Deleted

## 2020-08-31 ENCOUNTER — Ambulatory Visit (INDEPENDENT_AMBULATORY_CARE_PROVIDER_SITE_OTHER): Payer: Medicare Other | Admitting: Physician Assistant

## 2020-08-31 VITALS — BP 120/80 | HR 68 | Ht 60.0 in | Wt 205.4 lb

## 2020-08-31 DIAGNOSIS — R194 Change in bowel habit: Secondary | ICD-10-CM

## 2020-08-31 DIAGNOSIS — K625 Hemorrhage of anus and rectum: Secondary | ICD-10-CM

## 2020-08-31 DIAGNOSIS — R159 Full incontinence of feces: Secondary | ICD-10-CM

## 2020-08-31 MED ORDER — NA SULFATE-K SULFATE-MG SULF 17.5-3.13-1.6 GM/177ML PO SOLN: 1.0000 | Freq: Once | ORAL | 0 refills | Status: AC

## 2020-08-31 NOTE — Progress Notes (Addendum)
Chief Complaint: Change in bowel habits, fecal incontinence, rectal bleeding  HPI:    Megan Bradford is a 70 year old African-American female with a past medical history as listed below including ESRD status post kidney transplant on Prograf, who was referred to me by Horald Pollen, * for a bowel habits, fecal incontinence and rectal bleeding.        Today, the patient tells me that her last colonoscopy was in 2016 when she lived in [place], she has a history of polyps and was told to repeat in 3 to 5 years.  We do not have records of this today.  Concerning to the patient is that over the past 2 to 3 months she has had a change in bowel habits noting that she is having some fecal incontinence and has had to start wearing a "panty liner", because sometimes she will just have little bits of stool that come out, typically liquid/loose.  Tells me that she still has 2 solid bowel movements a day and feels that these are complete and does not feel constipated, but has had a few times of constipation here and there over the past few months.  Has tried to increase fiber in her diet which does not seem to be helping that much.  Has also noticed on 2 separate occasions that she has seen some bright red blood and mucus on the pantiliner after wiping.  The last time this occurred was a couple of weeks ago.    Denies abdominal pain, fever, chills or weight loss.  Past Medical History:  Diagnosis Date  . Cataract    Phreesia 11/24/2019  . Chronic kidney disease    [facility]reesia 11/24/2019  . Hypertension    [facility]reesia 11/24/2019    Past Surgical History:  Procedure Laterality Date  . CESAREAN SECTION N/A    [facility]ia 11/24/2019  . CHOLECYSTECTOMY N/A    [facility]ia 11/24/2019  . EYE SURGERY N/A    [facility]ia 11/24/2019    Current Outpatient Medications  Medication Sig Dispense Refill  . amLODipine (NORVASC) 5 MG tablet Take 5 mg by mouth daily.    Marland Kitchen aspirin EC 81 MG tablet Take 81 mg by mouth  daily. Swallow whole.    . carvedilol (COREG) 12.5 MG tablet Take 12.5 mg by mouth 2 (two) times daily with a meal.    . Cholecalciferol (VITAMIN D3) 10 MCG (400 UNIT) CAPS Take by mouth.    . Cyanocobalamin (VITAMIN B 12 PO) Take 1 tablet by mouth daily.    . diclofenac Sodium (VOLTAREN) 1 % GEL Apply 2 g topically 4 (four) times daily. 100 g 0  . LORazepam (ATIVAN) 0.5 MG tablet Take one tablet by mouth prior to your procedure.  May take one additional tablet during procedure if needed. 2 tablet 0  . magnesium oxide (MAG-OX) 400 MG tablet Take 400 mg by mouth daily.    . multivitamin-lutein (OCUVITE-LUTEIN) CAPS capsule Take 1 capsule by mouth daily.    . mycophenolate (MYFORTIC) 180 MG EC tablet Take 180 mg by mouth 2 (two) times daily.    . rosuvastatin (CRESTOR) 20 MG tablet Take 1 tablet (20 mg total) by mouth daily. 90 tablet 3  . tacrolimus (PROGRAF) 1 MG capsule Take 4 capsules by mouth in the morning.  Take 2 capsules by mouth in the evening. 540 capsule 3   No current facility-administered medications for this visit.    Allergies as of 08/31/2020 - Review Complete 08/29/2020  Allergen Reaction Noted  .  Keflex [cephalexin] Anaphylaxis 12/17/2019  . Xanax [alprazolam] Other (See Comments) 05/26/2020    Family History  Problem Relation Age of Onset  . Diabetes Mother   . Asthma Mother   . Hypertension Father   . Coronary artery disease Father     Social History   Socioeconomic History  . Marital status: Widowed    Spouse name: Not on file  . Number of children: Not on file  . Years of education: Not on file  . Highest education level: Not on file  Occupational History  . Not on file  Tobacco Use  . Smoking status: Never Smoker  . Smokeless tobacco: Never Used  Substance and Sexual Activity  . Alcohol use: Never  . Drug use: Never  . Sexual activity: Not Currently    Birth control/protection: None  Other Topics Concern  . Not on file  Social History Narrative   . Not on file   Social Determinants of Health   Financial Resource Strain: Not on file  Food Insecurity: Not on file  Transportation Needs: Not on file  Physical Activity: Not on file  Stress: Not on file  Social Connections: Not on file  Intimate Partner Violence: Not on file    Review of Systems:    Constitutional: No weight loss, fever or chills Skin: No rash Cardiovascular: No chest pain  Respiratory: No SOB  Gastrointestinal: See HPI and otherwise negative Genitourinary: No dysuria  Neurological: No headache Musculoskeletal: No new muscle or joint pain Hematologic: No bruising Psychiatric: No history of depression or anxiety   Physical Exam:  Vital signs: BP 120/80   Pulse 68   Ht 5' (1.524 m) Comment: height measured without shoes  Wt 205 lb 6 oz (93.2 kg)   BMI 40.11 kg/m   Constitutional:   Pleasant obese AA female appears to be in NAD, Well developed, Well nourished, alert and cooperative Head:  Normocephalic and atraumatic. Eyes:   PEERL, EOMI. No icterus. Conjunctiva pink. Ears:  Normal auditory acuity. Neck:  Supple Throat: Oral cavity and pharynx without inflammation, swelling or lesion.  Respiratory: Respirations even and unlabored. Lungs clear to auscultation bilaterally.   No wheezes, crackles, or rhonchi.  Cardiovascular: Normal S1, S2. No MRG. Regular rate and rhythm. No peripheral edema, cyanosis or pallor.  Gastrointestinal:  Soft, nondistended, nontender. No rebound or guarding. Normal bowel sounds. No appreciable masses or hepatomegaly. Rectal:  Not performed.  Msk:  Symmetrical without gross deformities. Without edema, no deformity or joint abnormality.  Neurologic:  Alert and  oriented x4;  grossly normal neurologically.  Skin:   Dry and intact without significant lesions or rashes. Psychiatric: Demonstrates good judgement and reason without abnormal affect or behaviors.  See HPI for recent labs.  Assessment: 1.  Change in bowel habits:  With fecal incontinence as below 2.  Fecal incontinence: Over the past 2 to 3 months; consider overflow constipation versus hemorrhoids versus other 3.  Rectal bleeding: On 2 separate occasions mixed in with mucus over the past couple of months; consider hemorrhoids versus polyps versus other 4.  Reported history of colon polyps: Per patient report, last colonoscopy in 2016 with recommendations to repeat in 3 years per her 5.  Status post kidney transplant in 2016  Plan: 1.  Scheduled patient for diagnostic colonoscopy in the [place] with Dr. Tarri Glenn.  Did provide the patient with a detailed list of risks for the procedure and she agrees to proceed. 2.  Discussed with patient that pending results from  above we can follow-up further in regards to her fecal incontinence.  Encouraged a high-fiber diet for now. 3.  We will try to get records from patient's last colonoscopy at [facility] 4.  Patient to follow in clinic per recommendations from Dr. Tarri Glenn after time of procedure.  Ellouise Newer, [name] [facility] 08/31/2020, 9:16 AM  Cc: [place], [place]: 09/15/2020 9:50 AM  Received patient's records from [facility] regarding colonoscopy.  08/09/2016 colonoscopy with one 8 mm polyp in the ascending colon, one 4 mm polyp in the ascending colon, one 2 millimeter polyp in the transverse colon, one 4 mm polyp in the rectum, one 3 mm polyp in the rectum and diverticulosis in the left colon.  Exam otherwise normal.  (We do not have pathology results)  Continue with plans for colonoscopy as scheduled.  Ellouise Newer, [name]

## 2020-08-31 NOTE — Patient Instructions (Addendum)
If you are age 69 or older, your body mass index should be between 23-30. Your Body mass index is 40.11 kg/m. If this is out of the aforementioned range listed, please consider follow up with your Primary Care Provider.  If you are age 24 or younger, your body mass index should be between 19-25. Your Body mass index is 40.11 kg/m. If this is out of the aformentioned range listed, please consider follow up with your Primary Care Provider.   You have been scheduled for a colonoscopy. Please follow written instructions given to you at your visit today.  Please pick up your prep supplies at the pharmacy within the next 1-3 days. If you use inhalers (even only as needed), please bring them with you on the day of your procedure.  Thank you for choosing me and [facility].  Ellouise Newer, [name]

## 2020-08-31 NOTE — Progress Notes (Signed)
Reviewed. Colonoscopy scheduled for June 2022. Will place the patient on a cancellation list in the event that we could proceed with her colonoscopy at an earlier date.   Natanael Saladin L. Tarri Glenn, MD, MPH

## 2020-08-31 NOTE — Progress Notes (Signed)
Appt added to cancellation list. 

## 2020-09-03 ENCOUNTER — Encounter: Payer: Self-pay | Admitting: *Deleted

## 2020-09-08 ENCOUNTER — Ambulatory Visit (INDEPENDENT_AMBULATORY_CARE_PROVIDER_SITE_OTHER): Payer: Medicare Other | Admitting: Orthopaedic Surgery

## 2020-09-08 ENCOUNTER — Ambulatory Visit: Payer: Self-pay

## 2020-09-08 ENCOUNTER — Other Ambulatory Visit: Payer: Self-pay

## 2020-09-08 ENCOUNTER — Encounter: Payer: Self-pay | Admitting: Orthopaedic Surgery

## 2020-09-08 DIAGNOSIS — M25552 Pain in left hip: Secondary | ICD-10-CM

## 2020-09-08 DIAGNOSIS — M545 Low back pain, unspecified: Secondary | ICD-10-CM | POA: Diagnosis not present

## 2020-09-08 DIAGNOSIS — M65331 Trigger finger, right middle finger: Secondary | ICD-10-CM

## 2020-09-08 MED ORDER — LORAZEPAM 1 MG PO TABS: 1.0000 mg | ORAL_TABLET | Freq: Once | ORAL | 0 refills | Status: AC

## 2020-09-08 NOTE — Progress Notes (Signed)
Office Visit Note   Patient: Megan Bradford           Date of Birth: [birth year]           MRN: [id number] Visit Date: 09/08/2020              Requested by: Horald Pollen, [place],  [place] [address] PCP: Horald Pollen, MD   Assessment & Plan: Visit Diagnoses:  1. Pain in left hip   2. Low back pain, unspecified back pain laterality, unspecified chronicity, unspecified whether sciatica present   3. Trigger finger, right middle finger     Plan: For the trigger finger we discussed options of repeating cortisone injection versus surgical release.  She will think about her options and let me know.  For hip and back I am concerned that she has spinal stenosis therefore we will need an MRI of the lumbar spine.  For the hip the lesion is concerning for impending pathologic fracture.  We will need MRI of the left hip for this.  Follow-up after the MRI.  Follow-Up Instructions: No follow-ups on file.   Orders:  Orders Placed This Encounter  Procedures  . XR HIP UNILAT W OR W/O PELVIS 2-3 VIEWS LEFT  . XR Lumbar Spine 2-3 Views   No orders of the defined types were placed in this encounter.     Procedures: No procedures performed   Clinical Data: No additional findings.   Subjective: Chief Complaint  Patient presents with  . Left Hip - Pain    Megan Bradford is 70 year old female comes in for follow-up of right long trigger finger and new problems of left hip and low back pain.  For the trigger finger we provide her with an injection about 5 months ago which gave her a month of relief.  The triggering has returned with daily activities such as washing dishes and cooking.  This is very bothersome to her.  For hip she has groin and hip pain as well as left-sided low back pain.  She has numbness and tingling in her feet.  She has start up stiffness and pain in the hip region.  She is unable to walk more than 50 feet at a time without having to sit down  due to the intense bilateral leg pain.  Positive grocery cart sign.   Review of Systems  Constitutional: Negative.   HENT: Negative.   Eyes: Negative.   Respiratory: Negative.   Cardiovascular: Negative.   Endocrine: Negative.   Musculoskeletal: Negative.   Neurological: Negative.   Hematological: Negative.   Psychiatric/Behavioral: Negative.   All other systems reviewed and are negative.    Objective: Vital Signs: There were no vitals taken for this visit.  Physical Exam Vitals and nursing note reviewed.  Constitutional:      Appearance: She is well-developed.  Pulmonary:     Effort: Pulmonary effort is normal.  Skin:    General: Skin is warm.     Capillary Refill: Capillary refill takes less than 2 seconds.  Neurological:     Mental Status: She is alert and oriented to person, place, and time.  Psychiatric:        Behavior: Behavior normal.        Thought Content: Thought content normal.        Judgment: Judgment normal.     Ortho Exam Right long finger shows moderate tenderness along the flexor tendon sheath especially in the palm.  There is active  triggering. Lumbar spine shows no tenderness of the spinous processes.  Tenderness to palpation of the SI joint and greater trochanter.  Range of motion of the hip produces moderate pain in the back region. Specialty Comments:  No specialty comments available.  Imaging: No results found.   PMFS History: Patient Active Problem List   Diagnosis Date Noted  . Coronary artery disease of native artery of native heart with stable angina pectoris ([place]) 08/30/2020  . Immunosuppression ([place]) 08/30/2020  . Stage 3b chronic kidney disease ([place]) 12/17/2019  . History of kidney transplant 11/27/2019  . Essential hypertension 11/27/2019  . Dyslipidemia 11/27/2019  . Prediabetes 11/27/2019  . Body mass index (BMI) of 38.0-38.9 in adult 11/27/2019   Past Medical History:  Diagnosis Date  . Cataract    [facility]reesia 11/24/2019   . Chronic kidney disease    [facility]reesia 11/24/2019  . Hypertension    [facility]reesia 11/24/2019    Family History  Problem Relation Age of Onset  . Diabetes Mother   . Asthma Mother   . Hypertension Father   . Coronary artery disease Father     Past Surgical History:  Procedure Laterality Date  . CESAREAN SECTION N/A    [facility]reesia 11/24/2019  . CHOLECYSTECTOMY N/A    [facility]reesia 11/24/2019  . EYE SURGERY N/A    Phreesia 11/24/2019   Social History   Occupational History  . Not on file  Tobacco Use  . Smoking status: Never Smoker  . Smokeless tobacco: Never Used  Vaping Use  . Vaping Use: Never used  Substance and Sexual Activity  . Alcohol use: Never  . Drug use: Never  . Sexual activity: Not Currently    Birth control/protection: None

## 2020-09-09 ENCOUNTER — Other Ambulatory Visit: Payer: Self-pay

## 2020-09-09 ENCOUNTER — Telehealth: Payer: Self-pay

## 2020-09-09 DIAGNOSIS — M545 Low back pain, unspecified: Secondary | ICD-10-CM

## 2020-09-09 DIAGNOSIS — M25552 Pain in left hip: Secondary | ICD-10-CM

## 2020-09-09 NOTE — Telephone Encounter (Signed)
(  2) MRI Orders placed

## 2020-09-10 DIAGNOSIS — L738 Other specified follicular disorders: Secondary | ICD-10-CM | POA: Diagnosis not present

## 2020-09-10 DIAGNOSIS — L304 Erythema intertrigo: Secondary | ICD-10-CM | POA: Diagnosis not present

## 2020-09-24 ENCOUNTER — Other Ambulatory Visit: Payer: Self-pay

## 2020-09-24 ENCOUNTER — Ambulatory Visit
Admission: RE | Admit: 2020-09-24 | Discharge: 2020-09-24 | Disposition: A | Discharge status: Home / Self Care | Payer: Medicare Other | Source: Ambulatory Visit | Attending: Orthopaedic Surgery | Admitting: Orthopaedic Surgery

## 2020-09-24 DIAGNOSIS — M25552 Pain in left hip: Secondary | ICD-10-CM | POA: Diagnosis not present

## 2020-09-24 DIAGNOSIS — M533 Sacrococcygeal disorders, not elsewhere classified: Secondary | ICD-10-CM | POA: Diagnosis not present

## 2020-09-24 DIAGNOSIS — M25452 Effusion, left hip: Secondary | ICD-10-CM | POA: Diagnosis not present

## 2020-09-24 DIAGNOSIS — M48061 Spinal stenosis, lumbar region without neurogenic claudication: Secondary | ICD-10-CM | POA: Diagnosis not present

## 2020-09-24 DIAGNOSIS — M545 Low back pain, unspecified: Secondary | ICD-10-CM

## 2020-09-24 NOTE — Progress Notes (Signed)
Cardiology Office Note:    Date:  09/28/2020   ID:  Megan Bradford, DOB 1951-04-18, MRN [id number]  PCP:  Horald Pollen, [place]  Cardiologist:  [name], MD  Advanced Practice Provider:  No care team member to display Electrophysiologist:  None   Referring MD: Horald Pollen, *    History of Present Illness:    Megan Bradford is a 70 y.o. female with a hx of HTN, ESRD s/p renal transplant (2016) with CKD stage IIIb, and HLD who presents for follow-up of palpitations.  During our last visit on 03/17/20, the patient was complaining of several month history of intermittent palpitations, DOE, and LE heaviness. Cardiac monitor showed runs of SVT with longest lasting 17 beats as well as occasional PACs. She declined BB at that time.TTE showed normal BiV function, G1DD, no significant valvular disease. ABIs negative for occlusive disease. High resolution CT chest with coronary calcium., but no evidence of sarcoid. She now returns to clinic for follow-up.  CT chest 06/17/20: Cardiovascular: Heart size is normal. There is no significant pericardial fluid, thickening or pericardial calcification. There is aortic atherosclerosis, as well as atherosclerosis of the great vessels of the mediastinum and the coronary arteries, including calcified atherosclerotic plaque in the left main, left anterior descending and right coronary arteries.  During our last visit on 06/29/18, the patient was doing well. Was continuing to have some SOB but palpitations improved. Myoview on 07/08/20 normal with no ischemia or infarction. She now presents to clinic for follow-up.  Today, she is having nausea, vomiting, and diarrhea at this visit. Her palpitations have improved since last visit, but are still present. Her at home blood pressure is averaging 120s/80s. She denies any chest pain, shortness of breath, or exertional symptoms. No headaches,  lightheadedness, or syncope to report. Also has no lower extremity edema, orthopnea or PND.  She is seeing orthopedics for her hip and back issues, which is limiting her physical activity.  Past Medical History:  Diagnosis Date  . Cataract    Phreesia 11/24/2019  . Chronic kidney disease    Phreesia 11/24/2019  . Hypertension    Phreesia 11/24/2019    Past Surgical History:  Procedure Laterality Date  . CESAREAN SECTION N/A    Phreesia 11/24/2019  . CHOLECYSTECTOMY N/A    Phreesia 11/24/2019  . EYE SURGERY N/A    Phreesia 11/24/2019    Current Medications: Current Meds  Medication Sig  . amLODipine (NORVASC) 5 MG tablet Take 5 mg by mouth daily.  Marland Kitchen aspirin EC 81 MG tablet Take 81 mg by mouth daily. Swallow whole.  . carvedilol (COREG) 12.5 MG tablet Take 12.5 mg by mouth 2 (two) times daily with a meal.  . Cholecalciferol (VITAMIN D3) 10 MCG (400 UNIT) CAPS Take by mouth daily.  . Cyanocobalamin (VITAMIN B 12 PO) Take 1 tablet by mouth daily.  . diclofenac Sodium (VOLTAREN) 1 % GEL Apply 2 g topically 4 (four) times daily.  . magnesium oxide (MAG-OX) 400 MG tablet Take 400 mg by mouth daily.  . multivitamin-lutein (OCUVITE-LUTEIN) CAPS capsule Take 1 capsule by mouth daily.  . mycophenolate (MYFORTIC) 180 MG EC tablet Take 180 mg by mouth 2 (two) times daily.  . rosuvastatin (CRESTOR) 20 MG tablet Take 1 tablet (20 mg total) by mouth daily.  . tacrolimus (PROGRAF) 1 MG capsule Take 4 capsules by mouth in the morning.  Take 2 capsules by mouth in the evening.  Allergies:   Keflex [cephalexin] and Xanax [alprazolam]   Social History   Socioeconomic History  . Marital status: Widowed    Spouse name: Not on file  . Number of children: Not on file  . Years of education: Not on file  . Highest education level: Not on file  Occupational History  . Not on file  Tobacco Use  . Smoking status: Never Smoker  . Smokeless tobacco: Never Used  Vaping Use  . Vaping Use:  Never used  Substance and Sexual Activity  . Alcohol use: Never  . Drug use: Never  . Sexual activity: Not Currently    Birth control/protection: None  Other Topics Concern  . Not on file  Social History Narrative  . Not on file   Social Determinants of Health   Financial Resource Strain: Not on file  Food Insecurity: Not on file  Transportation Needs: Not on file  Physical Activity: Not on file  Stress: Not on file  Social Connections: Not on file     Family History: The patient's family history includes Asthma in her mother; Coronary artery disease in her father; Diabetes in her mother; Hypertension in her father.  ROS:   Please see the history of present illness.    Review of Systems  Constitutional: Negative for chills, fever and weight loss.  HENT: Negative for nosebleeds.   Eyes: Negative for blurred vision and redness.  Respiratory: Negative for shortness of breath.   Cardiovascular: Positive for palpitations. Negative for chest pain, orthopnea, claudication, leg swelling and PND.  Gastrointestinal: Positive for diarrhea, nausea and vomiting. Negative for heartburn and melena.  Genitourinary: Negative for frequency.  Musculoskeletal: Positive for back pain and joint pain. Negative for myalgias.  Neurological: Negative for dizziness and loss of consciousness.  Endo/Heme/Allergies: Negative for polydipsia.  Psychiatric/Behavioral: The patient is not nervous/anxious.     EKGs/Labs/Other Studies Reviewed:    The following studies were reviewed today:  Lexiscan Myoview 07/08/2020:  Nuclear stress EF: 67%.  There was no ST segment deviation noted during stress.  The study is normal.  This is a low risk study.  The left ventricular ejection fraction is hyperdynamic (>65%).   Normal pharmacologic nuclear stress test with no evidence for prior infarct or ischemia. LVEF 67%.  TTE 04/13/20: IMPRESSIONS  1. Left ventricular ejection fraction, by estimation, is  65 to 70%. The  left ventricle has normal function. The left ventricle has no regional  wall motion abnormalities. There is mild concentric left ventricular  hypertrophy. Left ventricular diastolic  parameters are consistent with Grade I diastolic dysfunction (impaired  relaxation).  2. Right ventricular systolic function is normal. The right ventricular  size is normal. There is normal pulmonary artery systolic pressure. The  estimated right ventricular systolic pressure is 71.2 mmHg.  3. The mitral valve is normal in structure. Mild mitral valve  regurgitation. No evidence of mitral stenosis.  4. The aortic valve is normal in structure. Aortic valve regurgitation is  not visualized. No aortic stenosis is present.  5. The inferior vena cava is normal in size with greater than 50%  respiratory variability, suggesting right atrial pressure of 3 mmHg.   LE ABI 04/15/20: Summary:  Right: Normal examination. No evidence of arterial occlusive disease.  Medial calcifications noted.   Left: Normal examination. No evidence of arterial occlusive disease.  Medial calcifications noted.   Right: Resting right ankle-brachial index indicates noncompressible right  lower extremity arteries. The right toe-brachial index is normal.  Left: Resting left ankle-brachial index indicates noncompressible left  lower extremity arteries. The left toe-brachial index is normal.   CT chest 06/17/20: IMPRESSION: 1. No imaging stigmata of sarcoidosis. 2. No imaging findings to suggest interstitial lung disease. 3. Moderate air trapping indicative of small airways disease. 4. Aortic atherosclerosis, in addition to left main and 2 vessel coronary artery disease. Please note that although the presence of coronary artery calcium documents the presence of coronary artery disease, the severity of this disease and any potential stenosis cannot be assessed on this non-gated CT examination. Assessment  for potential risk factor modification, dietary therapy or pharmacologic therapy may be warranted, if clinically indicated. 5. Indeterminate left adrenal nodule measuring 2.5 x 1.9 cm. Follow-up adrenal protocol CT scan is recommended for further characterization. This recommendation follows ACR consensus guidelines: Management of Incidental Adrenal Masses: A White Paper of the ACR Incidental Findings Committee. J Am Coll Radiol 2017 (in Press).  PFT12/28/2020 FVC 1.76 [81%], FEV1 1.54 [90%], TLC 2.81 [62%], DLCO 13.05 [69%] Moderate restriction and diffusion impairment  Cardiac Monitor 03/2020:  Predominant rhythm was NSR with average HR 76bpm (ranging from 56bpm to 190bpm)  Patient had 250 runs of SVT with longest lasting 17 beats at average HR 117bpm. Fastest was 4 beats at rate 190bpm.  Occasional PACs (2.5%; 34547 beats); PVCs were rare  Rare couplets (<1%, 2956), rare triplets (<1% 1597)  Patient triggered events mainly correlated with SR/sinus tachycardia with PACs.  No Afib, VT or significant pauses  EKG: 09/28/2020: EKG is not ordered today.  Recent Labs: 11/27/2019: ALT 7 03/18/2020: BUN 28; Creatinine, Ser 1.72; Hemoglobin 12.7; Platelets 236; Potassium 3.7; Sodium 144  Recent Lipid Panel    Component Value Date/Time   CHOL 203 (H) 11/27/2019 1019   TRIG 81 11/27/2019 1019   HDL 73 11/27/2019 1019   CHOLHDL 2.8 11/27/2019 1019   LDLCALC 116 (H) 11/27/2019 1019     Physical Exam:    VS:  BP 136/86   Pulse 71   Ht 5' (1.524 m)   Wt 203 lb 6.4 oz (92.3 kg)   SpO2 98%   BMI 39.72 kg/m     Wt Readings from Last 3 Encounters:  09/28/20 203 lb 6.4 oz (92.3 kg)  08/31/20 205 lb 6 oz (93.2 kg)  08/30/20 204 lb 9.6 oz (92.8 kg)     GEN:  Well nourished, well developed in no acute distress HEENT: Normal NECK: No JVD; No carotid bruits CARDIAC: RRR, 1/6 systolic murmur, no rubs, no gallops RESPIRATORY:  Clear to auscultation without rales, wheezing or  rhonchi  ABDOMEN: Soft, non-tender, non-distended, hyperactive bowel sounds MUSCULOSKELETAL:  No edema; No deformity  SKIN: Warm and dry NEUROLOGIC:  Alert and oriented x 3 PSYCHIATRIC:  Normal affect   ASSESSMENT:    1. SOB (shortness of breath)   2. Palpitations   3. Essential hypertension   4. Renal transplant recipient   5. Mixed hyperlipidemia   6. Bilateral leg pain   7. Non-intractable vomiting with nausea, unspecified vomiting type   8. Diarrhea, unspecified type    PLAN:    In order of problems listed above:  #Palpitations: Improved. During prior visit, she presented with intermittent palpitations that have been ongoing for the past 4-5 months. Monitor with 250 runs of non-sustained SVT with longest episode 17 beats. Also with occasional PACs. No afib or sustained arrhythmias. Improved currently. Not on BB for now -Continue to monitor -Declined BB  #DOE: TTE with normal BiV function,  no significant valvular abnormalities. CT chest with coronary calcium in the LM/LAD, however, myoview normal with no evidence of ischemia or infarction. Suspect noncardiac in etiology. -Continue ASA and statin -Follow-up with pulm as scheduled -Continue weight loss efforts  #LE Pain: ABIs negative for obstructive disease. -Follow-up with PCP as scheduled  #Diarrhea/Vomiting: Developed this AM. Likely related to acute GI illness vs food poisoning. -Encouarged plenty of hydration -Follow-up with PCP as schedules  #ESRD s/p renal transplant with CKD stage IIIB: -Follow-uoNephrology -On tacrolimus  #HTN: Well controlled at home. -Continue home amlodipine 5mg  -Continue coreg 12.5mg  BID  #HLD: LDL controlled at 79. Given risk factors, goal LDL<100.  -Continue crestor 20mg  daily  #Obesity: BMI 39.  -Continue diet and weight loss efforts as below  Exercise recommendations: Goal of exercising for at least 30 minutes a day, at least 5 times per week.  Please exercise to a  moderate exertion.  This means that while exercising it is difficult to speak in full sentences, however you are not so short of breath that you feel you must stop, and not so comfortable that you can carry on a full conversation.  Exertion level should be approximately a 5/10, if 10 is the most exertion you can perform.  Diet recommendations: Recommend a heart healthy diet such as the Mediterranean diet.  This diet consists of plant based foods, healthy fats, lean meats, olive oil.  It suggests limiting the intake of simple carbohydrates such as white breads, pastries, and pastas.  It also limits the amount of red meat, wine, and dairy products such as cheese that one should consume on a daily basis.   Follow-up in 1 year.  Medication Adjustments/Labs and Tests Ordered: Current medicines are reviewed at length with the patient today.  Concerns regarding medicines are outlined above.  No orders of the defined types were placed in this encounter.  No orders of the defined types were placed in this encounter.   Patient Instructions  Medication Instructions:   Your physician recommends that you continue on your current medications as directed. Please refer to the Current Medication list given to you today.  *If you need a refill on your cardiac medications before your next appointment, please call your pharmacy*   Follow-Up: At [facility], you and your health needs are our priority.  As part of our continuing mission to provide you with exceptional heart care, we have created designated Provider Care Teams.  These Care Teams include your primary Cardiologist (physician) and Advanced Practice Providers (APPs -  Physician Assistants and Nurse Practitioners) who all work together to provide you with the care you need, when you need it.  We recommend signing up for the patient portal called "MyChart".  Sign up information is provided on this After Visit Summary.  MyChart is used to connect  with patients for Virtual Visits (Telemedicine).  Patients are able to view lab/test results, encounter notes, upcoming appointments, etc.  Non-urgent messages can be sent to your provider as well.   To learn more about what you can do with MyChart, go to [email or web address].    Your next appointment:   1 year(s)  The format for your next appointment:   In Person  Provider:   Gwyndolyn Kaufman, MD        [facility] [place],acting as a scribe for [name], MD.,have documented all relevant documentation on the behalf of [name], MD,as directed by  [name], MD while in the presence of [facility]  Renae Fickle, MD.  I, [name], MD, have reviewed all documentation for this visit. The documentation on 09/28/20 for the exam, diagnosis, procedures, and orders are all accurate and complete.  Signed, [name], MD  09/28/2020 10:23 AM    [facility]

## 2020-09-28 ENCOUNTER — Other Ambulatory Visit: Payer: Self-pay

## 2020-09-28 ENCOUNTER — Encounter: Payer: Self-pay | Admitting: Cardiology

## 2020-09-28 ENCOUNTER — Ambulatory Visit (INDEPENDENT_AMBULATORY_CARE_PROVIDER_SITE_OTHER): Payer: Medicare Other | Admitting: Cardiology

## 2020-09-28 VITALS — BP 136/86 | HR 71 | Ht 60.0 in | Wt 203.4 lb

## 2020-09-28 DIAGNOSIS — R112 Nausea with vomiting, unspecified: Secondary | ICD-10-CM | POA: Diagnosis not present

## 2020-09-28 DIAGNOSIS — R197 Diarrhea, unspecified: Secondary | ICD-10-CM | POA: Diagnosis not present

## 2020-09-28 DIAGNOSIS — R002 Palpitations: Secondary | ICD-10-CM

## 2020-09-28 DIAGNOSIS — I10 Essential (primary) hypertension: Secondary | ICD-10-CM

## 2020-09-28 DIAGNOSIS — M79604 Pain in right leg: Secondary | ICD-10-CM | POA: Diagnosis not present

## 2020-09-28 DIAGNOSIS — R0602 Shortness of breath: Secondary | ICD-10-CM | POA: Diagnosis not present

## 2020-09-28 DIAGNOSIS — Z940 Kidney transplant status: Secondary | ICD-10-CM

## 2020-09-28 DIAGNOSIS — M79605 Pain in left leg: Secondary | ICD-10-CM | POA: Diagnosis not present

## 2020-09-28 DIAGNOSIS — E782 Mixed hyperlipidemia: Secondary | ICD-10-CM

## 2020-09-28 NOTE — Progress Notes (Signed)
Needs appt

## 2020-09-28 NOTE — Patient Instructions (Signed)

## 2020-10-05 ENCOUNTER — Ambulatory Visit (INDEPENDENT_AMBULATORY_CARE_PROVIDER_SITE_OTHER): Payer: Medicare Other | Admitting: Orthopaedic Surgery

## 2020-10-05 DIAGNOSIS — M48062 Spinal stenosis, lumbar region with neurogenic claudication: Secondary | ICD-10-CM | POA: Diagnosis not present

## 2020-10-05 DIAGNOSIS — M65331 Trigger finger, right middle finger: Secondary | ICD-10-CM | POA: Diagnosis not present

## 2020-10-05 NOTE — Addendum Note (Signed)
Addended by: Lendon Collar on: 10/05/2020 01:11 PM   Modules accepted: Orders

## 2020-10-05 NOTE — Progress Notes (Signed)
Office Visit Note   Patient: Megan Bradford           Date of Birth: Nov 25, 1950           MRN: [id number] Visit Date: 10/05/2020              Requested by: Horald Pollen, MD [place],  [place] [id number] PCP: Horald Pollen, MD   Assessment & Plan: Visit Diagnoses:  1. Trigger finger, right middle finger   2. Spinal stenosis of lumbar region with neurogenic claudication     Plan: For the right long trigger finger given failure of conservative treatments she is not interested in any more injections and would rather have surgical release in the near future.  Risk benefits rehab recovery reviewed with the patient detail.  For the hip the lesion is benign and nonaggressive and does not show any impending pathologic fracture.  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  She is not having any symptoms from this and therefore we will just monitor this.  For the lumbar spine it does show that she has severe spinal stenosis which is what I suspected.  She requested referral to neurosurgery for surgical consultation.  She would like to have the trigger finger released first as this is a quicker recovery.  Follow-Up Instructions: Return for 1 week postop.   Orders:  No orders of the defined types were placed in this encounter.  No orders of the defined types were placed in this encounter.     Procedures: No procedures performed   Clinical Data: No additional findings.   Subjective: Chief Complaint  Patient presents with  . Lower Back - Pain    Ms. Megan Bradford returns today for MRI review of the left hip and lumbar spine and follow-up for right long trigger finger.  Cortisone injection for the right long finger was given back in December which did not give significant relief.  She continues to have difficulty with daily activities in terms of the right hand.  Back and hip symptoms are unchanged.   Review of Systems   Objective: Vital Signs: There were  no vitals taken for this visit.  Physical Exam  Ortho Exam Musculoskeletal exam is unchanged. Specialty Comments:  No specialty comments available.  Imaging: No results found.   PMFS History: Patient Active Problem List   Diagnosis Date Noted  . Coronary artery disease of native artery of native heart with stable angina pectoris ([place]) 08/30/2020  . Immunosuppression ([place]) 08/30/2020  . Stage 3b chronic kidney disease ([place]) 12/17/2019  . History of kidney transplant 11/27/2019  . Essential hypertension 11/27/2019  . Dyslipidemia 11/27/2019  . Prediabetes 11/27/2019  . Body mass index (BMI) of 38.0-38.9 in adult 11/27/2019   Past Medical History:  Diagnosis Date  . Cataract    Phreesia 11/24/2019  . Chronic kidney disease    Phreesia 11/24/2019  . Hypertension    Phreesia 11/24/2019    Family History  Problem Relation Age of Onset  . Diabetes Mother   . Asthma Mother   . Hypertension Father   . Coronary artery disease Father     Past Surgical History:  Procedure Laterality Date  . CESAREAN SECTION N/A    Phreesia 11/24/2019  . CHOLECYSTECTOMY N/A    Phreesia 11/24/2019  . EYE SURGERY N/A    Phreesia 11/24/2019   Social History   Occupational History  . Not on file  Tobacco Use  . Smoking status: Never Smoker  .  Smokeless tobacco: Never Used  Vaping Use  . Vaping Use: Never used  Substance and Sexual Activity  . Alcohol use: Never  . Drug use: Never  . Sexual activity: Not Currently    Birth control/protection: None

## 2020-10-20 ENCOUNTER — Telehealth: Payer: Self-pay | Admitting: *Deleted

## 2020-10-20 ENCOUNTER — Other Ambulatory Visit: Payer: Self-pay | Admitting: Neurosurgery

## 2020-10-20 NOTE — Telephone Encounter (Signed)
    Berdia Lachman DOB:  05-12-1951  MRN:  [id number]   Primary Cardiologist: [name], MD  Chart reviewed as part of pre-operative protocol coverage. Given past medical history and time since last visit, based on ACC/AHA guidelines, Janissa Bertram would be at acceptable risk for the planned procedure without further cardiovascular testing. She has no hx of CAD. She was recently seen in follow up with Dr. Johney Frame at which time her palpitations were much better controlled.   Given no CAD and no hx of PCI, she may hold ASA 3-5 days prior to procedure and resume when safe from a bleeding standpoint per surgical team.  The patient was advised that if she develops new symptoms prior to surgery to contact our office to arrange for a follow-up visit, and she verbalized understanding.  I will route this recommendation to the requesting party via Epic fax function and remove from pre-op pool.  Please call with questions.  Kathyrn Drown, NP 10/20/2020, 1:04 PM

## 2020-10-20 NOTE — Telephone Encounter (Signed)
   [facility] Pre-operative Risk Assessment    Patient Name: Megan Bradford  DOB: 1950/06/03  MRN: [id number]   HEARTCARE STAFF: - Please ensure there is not already an duplicate clearance open for this procedure. - Under Visit Info/Reason for Call, type in Other and utilize the format Clearance MM/DD/YY or Clearance TBD. Do not use dashes or single digits. - If request is for dental extraction, please clarify the # of teeth to be extracted. - If the patient is currently at the dentist's office, call Pre-Op APP to address. If the patient is not currently in the dentist office, please route to the Pre-Op pool  Request for surgical clearance:  1. What type of surgery is being performed? LUMBAR FUSION   2. When is this surgery scheduled? 11/30/20   3. What type of clearance is required (medical clearance vs. Pharmacy clearance to hold med vs. Both)? MEDICAL  4. Are there any medications that need to be held prior to surgery and how long? ASA    5. Practice name and name of physician performing surgery? [facility]; DR. Roderic Palau THOMAS   6. What is the office phone number? [phone] ATTN: [email or web address]   7.   What is the office fax number? [place]: [email or web address]  8.   Anesthesia type (None, local, MAC, general) ? GENERAL   Julaine Hua 10/20/2020, 12:07 PM  _________________________________________________________________   (provider comments below)

## 2020-10-24 ENCOUNTER — Other Ambulatory Visit: Payer: Self-pay | Admitting: Physician Assistant

## 2020-10-24 MED ORDER — HYDROCODONE-ACETAMINOPHEN 5-325 MG PO TABS: 1.0000 | ORAL_TABLET | Freq: Three times a day (TID) | ORAL | 0 refills | Status: DC | PRN

## 2020-10-24 MED ORDER — ONDANSETRON HCL 4 MG PO TABS: 4.0000 mg | ORAL_TABLET | Freq: Three times a day (TID) | ORAL | 0 refills | Status: DC | PRN

## 2020-10-26 ENCOUNTER — Ambulatory Visit: Payer: Medicare Other | Attending: Neurosurgery | Admitting: Rehabilitative and Restorative Service Providers"

## 2020-10-26 ENCOUNTER — Encounter: Payer: Self-pay | Admitting: Rehabilitative and Restorative Service Providers"

## 2020-10-26 ENCOUNTER — Other Ambulatory Visit: Payer: Self-pay

## 2020-10-26 DIAGNOSIS — M6281 Muscle weakness (generalized): Secondary | ICD-10-CM | POA: Diagnosis present

## 2020-10-26 DIAGNOSIS — R2689 Other abnormalities of gait and mobility: Secondary | ICD-10-CM | POA: Insufficient documentation

## 2020-10-26 DIAGNOSIS — G8929 Other chronic pain: Secondary | ICD-10-CM | POA: Diagnosis present

## 2020-10-26 DIAGNOSIS — M545 Low back pain, unspecified: Secondary | ICD-10-CM | POA: Diagnosis present

## 2020-10-26 DIAGNOSIS — M542 Cervicalgia: Secondary | ICD-10-CM | POA: Insufficient documentation

## 2020-10-26 DIAGNOSIS — R262 Difficulty in walking, not elsewhere classified: Secondary | ICD-10-CM | POA: Diagnosis present

## 2020-10-26 NOTE — Patient Instructions (Signed)
Access Code: [email or web address] URL: [email or web address] Date: 10/26/2020 Prepared by: Shelby Dubin Veeda Virgo  Exercises Seated Figure 4 Piriformis Stretch - 1-2 x daily - 7 x weekly - 1 sets - 3 reps - 20 hold Seated Hamstring Stretch - 1-2 x daily - 7 x weekly - 1 sets - 3 reps - 20 hold Supine Hamstring Stretch - 1 x daily - 7 x weekly - 1 sets - 3 reps - 20 hold Straight Leg Raise - 1 x daily - 7 x weekly - 2 sets - 10 reps Seated Transversus Abdominis Bracing - 2 x daily - 7 x weekly - 2 sets - 10 reps - 5-10 hold Seated Cervical Retraction - 2 x daily - 7 x weekly - 2 sets - 10 reps Seated Scapular Retraction - 1 x daily - 7 x weekly - 2 sets - 10 reps

## 2020-10-26 NOTE — Therapy (Signed)
[facility] [place], [place], [address] Phone: [phone]   Fax:  [phone]  Physical Therapy Evaluation  Patient Details  Name: Megan Bradford MRN: [id number] Date of Birth: 1951-05-05 Referring Provider (PT): Dr Duffy Rhody   Encounter Date: 10/26/2020   PT End of Session - 10/26/20 0839     Visit Number 1    Date for PT Re-Evaluation 12/17/20    PT Start Time 0755    PT Stop Time 0835    PT Time Calculation (min) 40 min    Activity Tolerance Patient tolerated treatment well;Patient limited by pain    Behavior During Therapy [facility] for tasks assessed/performed             Past Medical History:  Diagnosis Date   Cataract    Phreesia 11/24/2019   Chronic kidney disease    Phreesia 11/24/2019   Hypertension    Phreesia 11/24/2019    Past Surgical History:  Procedure Laterality Date   CESAREAN SECTION N/A    Phreesia 11/24/2019   CHOLECYSTECTOMY N/A    Phreesia 11/24/2019   EYE SURGERY N/A    Phreesia 11/24/2019    There were no vitals filed for this visit.    Subjective Assessment - 10/26/20 0757     Subjective Pt reports that she has had back pain going on for quite a while, but recently, it has gotten worse so she cannot walk anywhere.    Pertinent History HTN, Knee OA, L hip dysplasia    Limitations Walking    How long can you walk comfortably? 1/4 mile    Diagnostic tests MRI    Patient Stated Goals To be able to function without pain.    Currently in Pain? Yes    Pain Score 8     Pain Location Back    Pain Orientation Mid    Pain Descriptors / Indicators Aching;Burning    Pain Type Chronic pain    Pain Radiating Towards down both legs    Pain Onset More than a month ago    Pain Frequency Intermittent    Aggravating Factors  walking    Multiple Pain Sites Yes    Pain Score 6    Pain Location Neck    Pain Orientation Medial    Pain Descriptors / Indicators Throbbing     Pain Type Chronic pain    Pain Radiating Towards head    Pain Onset More than a month ago    Pain Frequency Intermittent                [facility] PT Assessment - 10/26/20 0001       Assessment   Medical Diagnosis Spondylolisthesis    Referring Provider (PT) Dr Duffy Rhody    Hand Dominance Right    Next MD Visit November 30, 2020 planned for surgery    Prior Therapy no      Precautions   Precautions None      Restrictions   Weight Bearing Restrictions No      Balance Screen   Has the patient fallen in the past 6 months No    Has the patient had a decrease in activity level because of a fear of falling?  No    Is the patient reluctant to leave their home because of a fear of falling?  No      Home Social worker Private residence    Living Arrangements Alone  Type of Home Apartment    Home Access Level entry    Home Layout One level    Home Equipment Grab bars - tub/shower      Prior Function   Level of Independence Independent    Vocation Retired    Leisure Walk      Cognition   Overall Cognitive Status Within Functional Limits for tasks assessed      ROM / Strength   AROM / PROM / Strength Strength;AROM      AROM   Overall AROM  Within functional limits for tasks performed      Strength   Overall Strength Deficits    Overall Strength Comments RUE grossly 56minus/5, L UE grossly 4+/5, L shoulder abduction is 4/5.  RLE is grossly 2minus/5.  L hip is 4+/5, L HS is 4/5      Flexibility   Soft Tissue Assessment /Muscle Length yes    Hamstrings bilateral tightness, L greater than R    Piriformis bilateral tightness noted, L greater than R      Transfers   Five time sit to stand comments  13.5 sec without UE                        Objective measurements completed on examination: See above findings.       [facility] Adult PT Treatment/Exercise - 10/26/20 0001       Exercises   Exercises Neck;Lumbar      Neck Exercises:  Standing   Neck Retraction 10 reps      Lumbar Exercises: Stretches   Active Hamstring Stretch Right;Left;1 rep;20 seconds    Active Hamstring Stretch Limitations supine 90-90 position    Piriformis Stretch Right;Left;1 rep;20 seconds    Piriformis Stretch Limitations seated figure 4      Lumbar Exercises: Supine   Straight Leg Raise 10 reps                    PT Education - 10/26/20 0832     Education Details Provided with HEP    Person(s) Educated Patient    Methods Explanation;Demonstration;Handout    Comprehension Verbalized understanding;Returned demonstration              PT Short Term Goals - 10/26/20 0939       PT SHORT TERM GOAL #1   Title Pt will be independent with initial HEP.    Time 2    Period Weeks    Status New               PT Long Term Goals - 10/26/20 0940       PT LONG TERM GOAL #1   Title Pt will be independent with advanced HEP.    Time 8    Period Weeks    Status New      PT LONG TERM GOAL #2   Title Pt will increase UE/LE strength bilat to 5/5 to allow her to go grocery shopping.    Time 8    Period Weeks    Status New      PT LONG TERM GOAL #3   Title Pt will be able to ambulate greater than a mile with pain no greater than 3/10 to allow her to resume her ambulation program.    Time 8    Period Weeks    Status New      PT LONG TERM GOAL #4   Title Pt will demonstrate  improved posture and maintaining core stabilty with functional activities in the PT gym.    Time 8    Period Weeks    Status New                    Plan - 10/26/20 0930     Clinical Impression Statement Patient is a 70 y.o. female who presents to PT with a referral from Dr Duffy Rhody secondary to lumbar spondylolisthesis.  She presents with increased pain, weakness, muscle tightness, and difficulty walking.  She reports that she once was able to walk a mile from her house to the [place] without dificulty, but she now is not  able to ambulate more than a 1/4 mile.  She states that she does have surgery scheduled for next month, but her MD wanted her to try PT before her surgery to address her functional deficits.  She is unable to lie in complete supine position and must have her head elevated.  She would benefit from skilled PT to address her functional impairments to allow her to decrease pain and have improved mobility prior to anticipated surgery.    Examination-Activity Limitations Locomotion Level    Examination-Participation Restrictions Community Activity    Stability/Clinical Decision Making Stable/Uncomplicated    Clinical Decision Making Low    Rehab Potential Good    PT Frequency 2x / week    PT Duration 8 weeks    PT Treatment/Interventions ADLs/Self Care Home Management;Cryotherapy;Electrical Stimulation;Iontophoresis 4mg /ml Dexamethasone;Moist Heat;Traction;Ultrasound;Gait training;Stair training;Functional mobility training;Therapeutic activities;Therapeutic exercise;Balance training;Neuromuscular re-education;Patient/family education;Manual techniques;Passive range of motion;Dry needling;Taping    PT Next Visit Plan stretching, strengthening, core stability    PT Home Exercise Plan Access Code [email or web address]    Consulted and Agree with Plan of Care Patient             Patient will benefit from skilled therapeutic intervention in order to improve the following deficits and impairments:  Difficulty walking, Increased muscle spasms, Decreased activity tolerance, Pain, Decreased balance, Impaired flexibility, Decreased strength  Visit Diagnosis: Cervicalgia - Plan: PT plan of care cert/re-cert  Chronic bilateral low back pain without sciatica - Plan: PT plan of care cert/re-cert  Muscle weakness (generalized) - Plan: PT plan of care cert/re-cert  Difficulty in walking, not elsewhere classified - Plan: PT plan of care cert/re-cert  Balance problem - Plan: PT plan of care cert/re-cert     Problem  List Patient Active Problem List   Diagnosis Date Noted   Coronary artery disease of native artery of native heart with stable angina pectoris ([place]) 08/30/2020   Immunosuppression ([place]) 08/30/2020   Stage 3b chronic kidney disease ([place]) 12/17/2019   History of kidney transplant 11/27/2019   Essential hypertension 11/27/2019   Dyslipidemia 11/27/2019   Prediabetes 11/27/2019   Body mass index (BMI) of 38.0-38.9 in adult 11/27/2019    Juel Burrow, PT, DPT 10/26/2020, 9:56 AM  [facility]. [place], [place], [address] Phone: [phone]   Fax:  [phone]  Name: Megan Bradford MRN: [id number] Date of Birth: 26-Jul-1950

## 2020-10-28 ENCOUNTER — Encounter: Payer: Self-pay | Admitting: Orthopaedic Surgery

## 2020-10-28 DIAGNOSIS — M65331 Trigger finger, right middle finger: Secondary | ICD-10-CM | POA: Insufficient documentation

## 2020-10-28 HISTORY — PX: TRIGGER FINGER RELEASE: SHX641

## 2020-11-02 ENCOUNTER — Ambulatory Visit: Payer: Medicare Other | Admitting: Physical Therapy

## 2020-11-02 ENCOUNTER — Telehealth: Payer: Self-pay

## 2020-11-02 NOTE — Telephone Encounter (Signed)
Please advise 

## 2020-11-02 NOTE — Telephone Encounter (Signed)
Sounds like it is due to swelling from the surgery.  Have her keep it elevated above the level of the heart and use ice and anti-inflammatories such as Aleve or Advil.

## 2020-11-02 NOTE — Telephone Encounter (Signed)
Yes that's fine 

## 2020-11-02 NOTE — Telephone Encounter (Signed)
Pt was called and informed

## 2020-11-02 NOTE — Telephone Encounter (Signed)
Patient called stating that she still has swelling in her right hand and that her fingers are numb and the numbness is radiating up to her right shoulder, which started this morning.  Patient had right hand surgery on Thursday, 10/28/2020.  Please advise.  PT does not have mychart.  CB# [phone] or [phone]

## 2020-11-02 NOTE — Telephone Encounter (Signed)
I called pt and advised. She stated understanding. She stated she is having a colonoscopy on Thursday. She wanted to know if this was ok, please advise

## 2020-11-03 ENCOUNTER — Other Ambulatory Visit: Payer: Self-pay | Admitting: Neurosurgery

## 2020-11-03 ENCOUNTER — Telehealth: Payer: Self-pay | Admitting: Gastroenterology

## 2020-11-03 NOTE — Telephone Encounter (Signed)
Good morning,  Patient called cancelled colon procedure 11/04/20 at 3:00 PM with Dr. Tarri Glenn.  Patient states she has a fever and vomiting. Patient states she will reschedule colon procedure when she is feeling better.  Thank you

## 2020-11-03 NOTE — Telephone Encounter (Signed)
Noted  

## 2020-11-04 ENCOUNTER — Encounter: Payer: Medicare Other | Admitting: Gastroenterology

## 2020-11-05 ENCOUNTER — Encounter: Payer: Self-pay | Admitting: Orthopaedic Surgery

## 2020-11-05 ENCOUNTER — Telehealth: Payer: Self-pay | Admitting: Orthopaedic Surgery

## 2020-11-05 ENCOUNTER — Ambulatory Visit (INDEPENDENT_AMBULATORY_CARE_PROVIDER_SITE_OTHER): Payer: Medicare Other | Admitting: Orthopaedic Surgery

## 2020-11-05 ENCOUNTER — Encounter: Payer: Medicare Other | Admitting: Orthopaedic Surgery

## 2020-11-05 DIAGNOSIS — M65331 Trigger finger, right middle finger: Secondary | ICD-10-CM

## 2020-11-05 MED ORDER — HYDROCODONE-ACETAMINOPHEN 5-325 MG PO TABS: 1.0000 | ORAL_TABLET | Freq: Every day | ORAL | 0 refills | Status: DC | PRN

## 2020-11-05 NOTE — Progress Notes (Signed)
   Post-Op Visit Note   Patient: Megan Bradford           Date of Birth: 1950-12-18           MRN: [id number] Visit Date: 11/05/2020 PCP: Horald Pollen, MD   Assessment & Plan:  Chief Complaint:  Chief Complaint  Patient presents with   Right Hand - Follow-up    Right Long Trigger finger release 10/28/2020   Visit Diagnoses:  1. Trigger finger, right middle finger     Plan: Megan Bradford is a 70 y.o. female following up 1 week status post Right long trigger finger release, DOS 10/28/20. She is doing well. Her pain is well controlled taking Norco half a pill every 8 hours. She had a fever of 102 F two days ago that resolved with fluids, but she has otherwise been feeling well. No severe pain, redness or drainage from her incision.  On exam surgical incision is clean, dry, and intact with sutures in place. No erythema, purulence or signs of infection. Sensory and motor intact to the long finger. Vascular exam intact.   She is doing well. At this point we'll cover the incision with a bandage and have her follow up in 1 week for suture removal. She can let water run over her incision in the shower but no soaking the incision. Refill for hydrocodone was sent in.   Follow-Up Instructions: Return in about 1 week (around 11/12/2020).   Orders:  No orders of the defined types were placed in this encounter.  Meds ordered this encounter  Medications   HYDROcodone-acetaminophen (NORCO) 5-325 MG tablet    Sig: Take 1-2 tablets by mouth daily as needed.    Dispense:  20 tablet    Refill:  0    Imaging: No new imaging.   PMFS History: Patient Active Problem List   Diagnosis Date Noted   Trigger finger, right middle finger 10/28/2020   Coronary artery disease of native artery of native heart with stable angina pectoris ([place]) 08/30/2020   Immunosuppression ([place]) 08/30/2020   Stage 3b chronic kidney disease ([place]) 12/17/2019   History of kidney transplant 11/27/2019   Essential  hypertension 11/27/2019   Dyslipidemia 11/27/2019   Prediabetes 11/27/2019   Body mass index (BMI) of 38.0-38.9 in adult 11/27/2019   Past Medical History:  Diagnosis Date   Cataract    Phreesia 11/24/2019   Chronic kidney disease    Phreesia 11/24/2019   Hypertension    Phreesia 11/24/2019    Family History  Problem Relation Age of Onset   Diabetes Mother    Asthma Mother    Hypertension Father    Coronary artery disease Father     Past Surgical History:  Procedure Laterality Date   CESAREAN SECTION N/A    Phreesia 11/24/2019   CHOLECYSTECTOMY N/A    Phreesia 11/24/2019   EYE SURGERY N/A    Phreesia 11/24/2019   Social History   Occupational History   Not on file  Tobacco Use   Smoking status: Never   Smokeless tobacco: Never  Vaping Use   Vaping Use: Never used  Substance and Sexual Activity   Alcohol use: Never   Drug use: Never   Sexual activity: Not Currently    Birth control/protection: None

## 2020-11-05 NOTE — Telephone Encounter (Signed)
Pt called and her transportation through her insurance cancelled and she said she needs to be worked in next  week because something is wrong with the surgery site.   CB [phone]

## 2020-11-05 NOTE — Telephone Encounter (Signed)
Yes you can work her in whenever

## 2020-11-09 ENCOUNTER — Ambulatory Visit: Payer: Medicare Other | Admitting: Rehabilitative and Restorative Service Providers"

## 2020-11-09 ENCOUNTER — Other Ambulatory Visit: Payer: Self-pay

## 2020-11-09 DIAGNOSIS — M545 Low back pain, unspecified: Secondary | ICD-10-CM

## 2020-11-09 DIAGNOSIS — M6281 Muscle weakness (generalized): Secondary | ICD-10-CM

## 2020-11-09 DIAGNOSIS — M542 Cervicalgia: Secondary | ICD-10-CM | POA: Diagnosis not present

## 2020-11-09 DIAGNOSIS — R2689 Other abnormalities of gait and mobility: Secondary | ICD-10-CM

## 2020-11-09 DIAGNOSIS — R262 Difficulty in walking, not elsewhere classified: Secondary | ICD-10-CM

## 2020-11-09 NOTE — Therapy (Signed)
[facility]. [place], [place], [address] Phone: [phone]   Fax:  [phone]  Physical Therapy Treatment  Patient Details  Name: Megan Bradford MRN: [id number] Date of Birth: Aug 25, 1950 Referring Provider (PT): Dr Duffy Rhody   Encounter Date: 11/09/2020   PT End of Session - 11/09/20 0835     Visit Number 2    Date for PT Re-Evaluation 12/17/20    PT Start Time 0831    PT Stop Time 0910    PT Time Calculation (min) 39 min    Activity Tolerance Patient tolerated treatment well;Patient limited by pain    Behavior During Therapy [facility] for tasks assessed/performed             Past Medical History:  Diagnosis Date   Cataract    Phreesia 11/24/2019   Chronic kidney disease    Phreesia 11/24/2019   Hypertension    Phreesia 11/24/2019    Past Surgical History:  Procedure Laterality Date   CESAREAN SECTION N/A    Phreesia 11/24/2019   CHOLECYSTECTOMY N/A    Phreesia 11/24/2019   EYE SURGERY N/A    Phreesia 11/24/2019    There were no vitals filed for this visit.   Subjective Assessment - 11/09/20 0833     Subjective I had my trigger finger surgery on 6/16.  My exercises are going so-so.    Patient Stated Goals To be able to function without pain.    Currently in Pain? Yes    Pain Score 5     Pain Location Back    Pain Orientation Mid    Pain Descriptors / Indicators Radiating    Pain Type Chronic pain    Pain Radiating Towards down both legs    Pain Score 6    Pain Location Neck    Pain Orientation Medial    Pain Type Chronic pain                               [facility] Adult PT Treatment/Exercise - 11/09/20 0001       Ambulation/Gait   Ambulation/Gait Yes    Ambulation/Gait Assistance 7: Independent    Ambulation Distance (Feet) 250 Feet    Gait Pattern Decreased arm swing - left;Decreased trunk rotation    Ambulation Surface Outdoor;Paved;Unlevel    Curb 6:  Modified independent (Device/increase time)      Lumbar Exercises: Stretches   Active Hamstring Stretch Right;Left;2 reps;20 seconds    Active Hamstring Stretch Limitations supine 90-90 position    Lower Trunk Rotation 5 reps   2x5   Piriformis Stretch Right;Left;2 reps;20 seconds    Piriformis Stretch Limitations supine    Gastroc Stretch Right;Left;3 reps;20 seconds      Lumbar Exercises: Aerobic   Nustep L3 x6 min      Lumbar Exercises: Standing   Heel Raises 20 reps      Lumbar Exercises: Seated   Long Arc Quad on Chair Strengthening;Both;2 sets;10 reps    LAQ on Chair Weights (lbs) 2    Sit to Stand 10 reps    Other Seated Lumbar Exercises Marching 2# 2x10 BLE.  Hip abduction with red tband 2x10 reps, HS curl with red tband 2x10 bilat.      Lumbar Exercises: Supine   Bridge 10 reps  PT Short Term Goals - 11/09/20 0931       PT SHORT TERM GOAL #1   Title Pt will be independent with initial HEP.    Status On-going               PT Long Term Goals - 11/09/20 0932       PT LONG TERM GOAL #1   Title Pt will be independent with advanced HEP.    Status On-going      PT LONG TERM GOAL #2   Title Pt will increase UE/LE strength bilat to 5/5 to allow her to go grocery shopping.    Status On-going      PT LONG TERM GOAL #3   Title Pt will be able to ambulate greater than a mile with pain no greater than 3/10 to allow her to resume her ambulation program.    Status On-going      PT LONG TERM GOAL #4   Title Pt will demonstrate improved posture and maintaining core stabilty with functional activities in the PT gym.    Status On-going                   Plan - 11/09/20 0017     Clinical Impression Statement Patient able to progress with PT, but admits that she has had some difficulty with HEP.  Pt denies any precautions with RUE following trigger finger surgery.  She was able to perform visits, but did report increased  radiating leg pain following ambulation outside.  Pt required use of stretch strap during hamstring and piriformis stretch.  She requires cuing for posture and technique.  She would benefit from continued skilled PT to progress towards goal related activities.    PT Treatment/Interventions ADLs/Self Care Home Management;Cryotherapy;Electrical Stimulation;Iontophoresis 4mg /ml Dexamethasone;Moist Heat;Traction;Ultrasound;Gait training;Stair training;Functional mobility training;Therapeutic activities;Therapeutic exercise;Balance training;Neuromuscular re-education;Patient/family education;Manual techniques;Passive range of motion;Dry needling;Taping    PT Next Visit Plan stretching, strengthening, core stability    Consulted and Agree with Plan of Care Patient             Patient will benefit from skilled therapeutic intervention in order to improve the following deficits and impairments:  Difficulty walking, Increased muscle spasms, Decreased activity tolerance, Pain, Decreased balance, Impaired flexibility, Decreased strength  Visit Diagnosis: Cervicalgia  Chronic bilateral low back pain without sciatica  Muscle weakness (generalized)  Difficulty in walking, not elsewhere classified  Balance problem     Problem List Patient Active Problem List   Diagnosis Date Noted   Trigger finger, right middle finger 10/28/2020   Coronary artery disease of native artery of native heart with stable angina pectoris ([place]) 08/30/2020   Immunosuppression ([place]) 08/30/2020   Stage 3b chronic kidney disease ([place]) 12/17/2019   History of kidney transplant 11/27/2019   Essential hypertension 11/27/2019   Dyslipidemia 11/27/2019   Prediabetes 11/27/2019   Body mass index (BMI) of 38.0-38.9 in adult 11/27/2019    Juel Burrow, PT, DPT 11/09/2020, 9:34 AM  [facility]. [place], [place], [address] Phone: [phone]   Fax:   [phone]  Name: Megan Bradford MRN: [id number] Date of Birth: May 12, 1951

## 2020-11-10 ENCOUNTER — Ambulatory Visit (INDEPENDENT_AMBULATORY_CARE_PROVIDER_SITE_OTHER): Payer: Medicare Other | Admitting: Emergency Medicine

## 2020-11-10 ENCOUNTER — Encounter: Payer: Medicare Other | Admitting: Orthopaedic Surgery

## 2020-11-10 ENCOUNTER — Encounter: Payer: Self-pay | Admitting: Emergency Medicine

## 2020-11-10 VITALS — BP 128/80 | HR 68 | Temp 98.4°F | Ht 60.0 in | Wt 204.8 lb

## 2020-11-10 DIAGNOSIS — E785 Hyperlipidemia, unspecified: Secondary | ICD-10-CM

## 2020-11-10 DIAGNOSIS — Z01818 Encounter for other preprocedural examination: Secondary | ICD-10-CM

## 2020-11-10 DIAGNOSIS — I700 Atherosclerosis of aorta: Secondary | ICD-10-CM

## 2020-11-10 DIAGNOSIS — D849 Immunodeficiency, unspecified: Secondary | ICD-10-CM | POA: Diagnosis not present

## 2020-11-10 DIAGNOSIS — N1832 Chronic kidney disease, stage 3b: Secondary | ICD-10-CM | POA: Diagnosis not present

## 2020-11-10 DIAGNOSIS — I25118 Atherosclerotic heart disease of native coronary artery with other forms of angina pectoris: Secondary | ICD-10-CM

## 2020-11-10 DIAGNOSIS — Z940 Kidney transplant status: Secondary | ICD-10-CM | POA: Diagnosis not present

## 2020-11-10 DIAGNOSIS — R7303 Prediabetes: Secondary | ICD-10-CM

## 2020-11-10 NOTE — Patient Instructions (Signed)

## 2020-11-10 NOTE — Progress Notes (Signed)
Megan Bradford 70 y.o.   Chief Complaint  Patient presents with   Pre-op Exam    Neuro     HISTORY OF PRESENT ILLNESS: This is a 70 y.o. female here for a preop evaluation. Scheduled for lumbar fusion under general anesthesia on 11/30/2020 by Dr. Duffy Rhody. No history of prior general anesthesia problems.  Okay with blood transfusions as necessary. 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  Presently on carvedilol 12.5 mg daily. #5 coronary artery calcifications, last cardiology visit on 09/28/2020 #6 obesity States she recently had blood work done but results not in epic or available to me at present time.   HPI   Prior to Admission medications   Medication Sig Start Date End Date Taking? Authorizing Provider  amLODipine (NORVASC) 5 MG tablet Take 5 mg by mouth daily.   Yes [provider]  aspirin EC 81 MG tablet Take 81 mg by mouth daily. Swallow whole.   Yes [provider]  carvedilol (COREG) 12.5 MG tablet Take 12.5 mg by mouth 2 (two) times daily with a meal.   Yes [provider]  Cholecalciferol (VITAMIN D3) 10 MCG (400 UNIT) CAPS Take by mouth daily.   Yes [provider]  Cyanocobalamin (VITAMIN B 12 PO) Take 1 tablet by mouth daily.   Yes [provider]  diclofenac Sodium (VOLTAREN) 1 % GEL Apply 2 g topically 4 (four) times daily. 12/01/19  Yes Wieters, Hallie C, [name]  HYDROcodone-acetaminophen (NORCO) 5-325 MG tablet Take 1-2 tablets by mouth daily as needed. 11/05/20  Yes Leandrew Koyanagi, MD  magnesium oxide (MAG-OX) 400 MG tablet Take 400 mg by mouth daily.   Yes [provider]  multivitamin-lutein (OCUVITE-LUTEIN) CAPS capsule Take 1  capsule by mouth daily.   Yes [provider]  mycophenolate (MYFORTIC) 180 MG EC tablet Take 180 mg by mouth 2 (two) times daily.   Yes [provider]  ondansetron (ZOFRAN) 4 MG tablet Take 1 tablet (4 mg total) by mouth every 8 (eight) hours as needed for nausea or vomiting. 10/24/20  Yes [name], [name]  rosuvastatin (CRESTOR) 20 MG tablet Take 1 tablet (20 mg total) by mouth daily. 03/17/20  Yes [name], MD  tacrolimus (PROGRAF) 1 MG capsule Take 4 capsules by mouth in the morning.  Take 2 capsules by mouth in the evening. 08/30/20  Yes Bradford, Ines Bloomer, MD    Allergies  Allergen Reactions   Keflex [Cephalexin] Anaphylaxis   Xanax [Alprazolam] Other (See Comments)    Per patient it does not make me "violent"    Patient Active Problem List   Diagnosis Date Noted   Trigger finger, right middle finger 10/28/2020   Coronary artery disease of native artery of native heart with stable angina pectoris ([place]) 08/30/2020   Immunosuppression ([place]) 08/30/2020   Stage 3b chronic kidney disease ([place]) 12/17/2019   History of kidney transplant 11/27/2019   Essential hypertension 11/27/2019   Dyslipidemia 11/27/2019   Prediabetes 11/27/2019   Body mass index (BMI) of 38.0-38.9 in adult 11/27/2019    Past Medical History:  Diagnosis Date   Cataract    Phreesia 11/24/2019   Chronic kidney disease    Phreesia 11/24/2019   Hypertension  Phreesia 11/24/2019    Past Surgical History:  Procedure Laterality Date   CESAREAN SECTION N/A    Phreesia 11/24/2019   CHOLECYSTECTOMY N/A    Phreesia 11/24/2019   EYE SURGERY N/A    Phreesia 11/24/2019    Social History   Socioeconomic History   Marital status: Widowed    Spouse name: Not on file   Number of children: Not on file   Years of education: Not on file   Highest education level: Not on file  Occupational History   Not on file  Tobacco Use   Smoking status: Never   Smokeless tobacco:  Never  Vaping Use   Vaping Use: Never used  Substance and Sexual Activity   Alcohol use: Never   Drug use: Never   Sexual activity: Not Currently    Birth control/protection: None  Other Topics Concern   Not on file  Social History Narrative   Not on file   Social Determinants of Health   Financial Resource Strain: Not on file  Food Insecurity: Not on file  Transportation Needs: Not on file  Physical Activity: Not on file  Stress: Not on file  Social Connections: Not on file  Intimate Partner Violence: Not on file    Family History  Problem Relation Age of Onset   Diabetes Mother    Asthma Mother    Hypertension Father    Coronary artery disease Father      Review of Systems  Constitutional: Negative.  Negative for chills and fever.  HENT: Negative.  Negative for congestion and sore throat.   Respiratory: Negative.  Negative for cough and shortness of breath.   Cardiovascular: Negative.  Negative for chest pain and palpitations.  Gastrointestinal:  Negative for abdominal pain, diarrhea, nausea and vomiting.  Genitourinary: Negative.  Negative for dysuria and hematuria.  Skin: Negative.  Negative for rash.  Neurological:  Negative for dizziness and headaches.  All other systems reviewed and are negative. Today's Vitals   11/10/20 1017  BP: 128/80  Pulse: 68  Temp: 98.4 F (36.9 C)  TempSrc: Oral  SpO2: 98%  Weight: 204 lb 12.8 oz (92.9 kg)  Height: 5' (1.524 m)   Body mass index is 40 kg/m.   Physical Exam Vitals reviewed.  Constitutional:      Appearance: Normal appearance.  HENT:     Head: Normocephalic.  Eyes:     Extraocular Movements: Extraocular movements intact.     Conjunctiva/sclera: Conjunctivae normal.     Pupils: Pupils are equal, round, and reactive to light.  Cardiovascular:     Rate and Rhythm: Normal rate and regular rhythm.     Pulses: Normal pulses.     Heart sounds: Normal heart sounds.  Pulmonary:     Effort: Pulmonary effort  is normal.     Breath sounds: Normal breath sounds.  Musculoskeletal:        General: Normal range of motion.     Cervical back: Normal range of motion and neck supple.     Right lower leg: No edema.     Left lower leg: No edema.  Skin:    General: Skin is warm and dry.     Capillary Refill: Capillary refill takes less than 2 seconds.  Neurological:     General: No focal deficit present.     Mental Status: She is alert and oriented to person, place, and time.  Psychiatric:        Mood and Affect: Mood normal.  Behavior: Behavior normal.     ASSESSMENT & PLAN: 70 year old female kidney transplant patient on immunosuppressive therapy, obese, with cardiovascular disease and prediabetes. Based on her chronic medical problems I consider this patient to be at a medium-high risk for perioperative cardiac event so she should be evaluated and cleared by her cardiologist. In addition preop corticosteroid protocol should be applied due to high stress event.  Renal doctor should also be consulted prior to surgery.  Latrisha was seen today for pre-op exam.  Diagnoses and all orders for this visit:  Preop exam for internal medicine -     CBC with Differential/Platelet -     Comprehensive metabolic panel -     Hemoglobin A1c -     Protime-INR; Future  Stage 3b chronic kidney disease ([place])  Immunosuppression ([place])  History of kidney transplant  Dyslipidemia  Aortic atherosclerosis ([facility])  Prediabetes  Coronary artery disease of native artery of native heart with stable angina pectoris [facility])  Patient Instructions  Health Maintenance After Age 72 After age 61, you are at a higher risk for certain long-term diseases and infections as well as injuries from falls. Falls are a major cause of broken bones and head injuries in people who are older than age 75. Getting regular preventive care can help to keep you healthy and well. Preventive care includes getting regular testing and  making lifestyle changes as recommended by your health care provider. Talk with your health care provider about: Which screenings and tests you should have. A screening is a test that checks for a disease when you have no symptoms. A diet and exercise plan that is right for you. What should I know about screenings and tests to prevent falls? Screening and testing are the best ways to find a health problem early. Early diagnosis and treatment give you the best chance of managing medical conditions that are common after age 26. Certain conditions and lifestyle choices may make you more likely to have a fall. Your health care provider may recommend: Regular vision checks. Poor vision and conditions such as cataracts can make you more likely to have a fall. If you wear glasses, make sure to get your prescription updated if your vision changes. Medicine review. Work with your health care provider to regularly review all of the medicines you are taking, including over-the-counter medicines. Ask your health care provider about any side effects that may make you more likely to have a fall. Tell your health care provider if any medicines that you take make you feel dizzy or sleepy. Osteoporosis screening. Osteoporosis is a condition that causes the bones to get weaker. This can make the bones weak and cause them to break more easily. Blood pressure screening. Blood pressure changes and medicines to control blood pressure can make you feel dizzy. Strength and balance checks. Your health care provider may recommend certain tests to check your strength and balance while standing, walking, or changing positions. Foot health exam. Foot pain and numbness, as well as not wearing proper footwear, can make you more likely to have a fall. Depression screening. You may be more likely to have a fall if you have a fear of falling, feel emotionally low, or feel unable to do activities that you used to do. Alcohol use  screening. Using too much alcohol can affect your balance and may make you more likely to have a fall. What actions can I take to lower my risk of falls? General instructions Talk with your health  care provider about your risks for falling. Tell your health care provider if: You fall. Be sure to tell your health care provider about all falls, even ones that seem minor. You feel dizzy, sleepy, or off-balance. Take over-the-counter and prescription medicines only as told by your health care provider. These include any supplements. Eat a healthy diet and maintain a healthy weight. A healthy diet includes low-fat dairy products, low-fat (lean) meats, and fiber from whole grains, beans, and lots of fruits and vegetables. Home safety Remove any tripping hazards, such as rugs, cords, and clutter. Install safety equipment such as grab bars in bathrooms and safety rails on stairs. Keep rooms and walkways well-lit. Activity  Follow a regular exercise program to stay fit. This will help you maintain your balance. Ask your health care provider what types of exercise are appropriate for you. If you need a cane or walker, use it as recommended by your health care provider. Wear supportive shoes that have nonskid soles.  Lifestyle Do not drink alcohol if your health care provider tells you not to drink. If you drink alcohol, limit how much you have: 0-1 drink a day for women. 0-2 drinks a day for men. Be aware of how much alcohol is in your drink. In the [place] one drink equals one typical bottle of beer (12 oz), one-half glass of wine (5 oz), or one shot of hard liquor (1 oz). Do not use any products that contain nicotine or tobacco, such as cigarettes and e-cigarettes. If you need help quitting, ask your health care provider. Summary Having a healthy lifestyle and getting preventive care can help to protect your health and wellness after age 59. Screening and testing are the best way to find a health  problem early and help you avoid having a fall. Early diagnosis and treatment give you the best chance for managing medical conditions that are more common for people who are older than age 69. Falls are a major cause of broken bones and head injuries in people who are older than age 54. Take precautions to prevent a fall at home. Work with your health care provider to learn what changes you can make to improve your health and wellness and to prevent falls. This information is not intended to replace advice given to you by your health care provider. Make sure you discuss any questions you have with your healthcare provider. Document Revised: 04/16/2020 Document Reviewed: 04/16/2020 Elsevier Patient Education  2022 [place], MD [facility]

## 2020-11-18 ENCOUNTER — Encounter: Payer: Self-pay | Admitting: Orthopaedic Surgery

## 2020-11-18 ENCOUNTER — Ambulatory Visit (INDEPENDENT_AMBULATORY_CARE_PROVIDER_SITE_OTHER): Payer: Medicare Other | Admitting: Physician Assistant

## 2020-11-18 DIAGNOSIS — M65331 Trigger finger, right middle finger: Secondary | ICD-10-CM

## 2020-11-18 NOTE — Progress Notes (Signed)
   Post-Op Visit Note   Patient: Megan Bradford           Date of Birth: 07-24-1950           MRN: [id number] Visit Date: 11/18/2020 PCP: Horald Pollen, MD   Assessment & Plan:  Chief Complaint:  Chief Complaint  Patient presents with   Right Middle Finger - Routine Post Op   Visit Diagnoses:  1. Trigger finger, right middle finger     Plan: Patient is a pleasant 70 year old female who comes in today 3 weeks out right long trigger finger release, date of surgery 10/28/2020.  She has been doing well.  She still notes some stiffness and soreness but overall is doing better.  She has been using a stress ball to work on range of motion.  Examination of her right hand reveals a well-healed surgical incision with nylon sutures in place.  No evidence of infection or cellulitis.  Fingers are warm and well-perfused.  She is neurovascular intact distally.  Today, sutures were removed and Steri-Strips applied.  No heavy lifting or submerging her hand underwater for another week.  Follow-up with [place] in 3 weeks time when she is 6 weeks out from surgery for recheck.  Call with concerns or questions in the meantime.  Follow-Up Instructions: Return in about 3 weeks (around 12/09/2020).   Orders:  No orders of the defined types were placed in this encounter.  No orders of the defined types were placed in this encounter.   Imaging: No new imaging  PMFS History: Patient Active Problem List   Diagnosis Date Noted   Trigger finger, right middle finger 10/28/2020   Coronary artery disease of native artery of native heart with stable angina pectoris ([place]) 08/30/2020   Immunosuppression ([place]) 08/30/2020   Stage 3b chronic kidney disease ([place]) 12/17/2019   History of kidney transplant 11/27/2019   Essential hypertension 11/27/2019   Dyslipidemia 11/27/2019   Prediabetes 11/27/2019   Body mass index (BMI) of 38.0-38.9 in adult 11/27/2019   Past Medical History:  Diagnosis Date   Cataract     Phreesia 11/24/2019   Chronic kidney disease    Phreesia 11/24/2019   Hypertension    Phreesia 11/24/2019    Family History  Problem Relation Age of Onset   Diabetes Mother    Asthma Mother    Hypertension Father    Coronary artery disease Father     Past Surgical History:  Procedure Laterality Date   CESAREAN SECTION N/A    Phreesia 11/24/2019   CHOLECYSTECTOMY N/A    Phreesia 11/24/2019   EYE SURGERY N/A    Phreesia 11/24/2019   Social History   Occupational History   Not on file  Tobacco Use   Smoking status: Never   Smokeless tobacco: Never  Vaping Use   Vaping Use: Never used  Substance and Sexual Activity   Alcohol use: Never   Drug use: Never   Sexual activity: Not Currently    Birth control/protection: None

## 2020-11-26 NOTE — Pre-Procedure Instructions (Signed)
Surgical Instructions    Your procedure is scheduled on 12/01/20.  Report to [facility] Main Entrance "A" at [place] then check in with the Admitting office.  Call this number if you have problems the morning of surgery:  [phone]   If you have any questions prior to your surgery date call [phone]: Open Monday-Friday 8am-4pm    Remember:  Do not eat or drink after midnight the night before your surgery     Take these medicines the morning of surgery with A SIP OF WATER  amLODipine (NORVASC)  carvedilol (COREG) mycophenolate (MYFORTIC) rosuvastatin (CRESTOR) tacrolimus (PROGRAF)  If needed: acetaminophen (TYLENOL)  ondansetron (ZOFRAN)    As of today, STOP taking any Aspirin (unless otherwise instructed by your surgeon) Aleve, Naproxen, Ibuprofen, Motrin, Advil, Goody's, BC's, all herbal medications, fish oil, and all vitamins.          Do not wear jewelry or makeup Do not wear lotions, powders, perfumes/colognes, or deodorant. Do not shave 48 hours prior to surgery.  Men may shave face and neck. Do not bring valuables to the hospital. DO Not wear nail polish, gel polish, artificial nails, or any other type of covering on  natural nails including finger and toenails. If patients have artificial nails, gel coating, etc. that need to be removed by a nail salon please have this removed prior to surgery or surgery may need to be canceled/delayed if the surgeon/ anesthesia feels like the patient is unable to be adequately monitored.             [facility] is not responsible for any belongings or valuables.  Do NOT Smoke (Tobacco/Vaping) or drink Alcohol 24 hours prior to your procedure If you use a CPAP at night, you may bring all equipment for your overnight stay.   Contacts, glasses, dentures or bridgework may not be worn into surgery, please bring cases for these belongings   For patients admitted to the hospital, discharge time will be determined by your treatment  team.   Patients discharged the day of surgery will not be allowed to drive home, and someone needs to stay with them for 24 hours.  ONLY 1 SUPPORT PERSON MAY BE PRESENT WHILE YOU ARE IN SURGERY. IF YOU ARE TO BE ADMITTED ONCE YOU ARE IN YOUR ROOM YOU WILL BE ALLOWED TWO (2) VISITORS.  Minor children may have two parents present. Special consideration for safety and communication needs will be reviewed on a case by case basis.  Special instructions:    Oral Hygiene is also important to reduce your risk of infection.  Remember - BRUSH YOUR TEETH THE MORNING OF SURGERY WITH YOUR REGULAR TOOTHPASTE   [facility]- Preparing For Surgery  Before surgery, you can play an important role. Because skin is not sterile, your skin needs to be as free of germs as possible. You can reduce the number of germs on your skin by washing with CHG (chlorahexidine gluconate) Soap before surgery.  CHG is an antiseptic cleaner which kills germs and bonds with the skin to continue killing germs even after washing.     Please do not use if you have an allergy to CHG or antibacterial soaps. If your skin becomes reddened/irritated stop using the CHG.  Do not shave (including legs and underarms) for at least 48 hours prior to first CHG shower. It is OK to shave your face.  Please follow these instructions carefully.     Shower the NIGHT BEFORE SURGERY and the [facility]  OF SURGERY with CHG Soap.   If you chose to wash your hair, wash your hair first as usual with your normal shampoo. After you shampoo, rinse your hair and body thoroughly to remove the shampoo.  Then [employer] and genitals (private parts) with your normal soap and rinse thoroughly to remove soap.  After that Use CHG Soap as you would any other liquid soap. You can apply CHG directly to the skin and wash gently with a scrungie or a clean washcloth.   Apply the CHG Soap to your body ONLY FROM THE NECK DOWN.  Do not use on open wounds or open sores. Avoid  contact with your eyes, ears, mouth and genitals (private parts). Wash Face and genitals (private parts)  with your normal soap.   Wash thoroughly, paying special attention to the area where your surgery will be performed.  Thoroughly rinse your body with warm water from the neck down.  DO NOT shower/wash with your normal soap after using and rinsing off the CHG Soap.  Pat yourself dry with a CLEAN TOWEL.  Wear CLEAN PAJAMAS to bed the night before surgery  Place CLEAN SHEETS on your bed the night before your surgery  DO NOT SLEEP WITH PETS.   Day of Surgery:  Take a shower with CHG soap. Wear Clean/Comfortable clothing the morning of surgery Do not apply any deodorants/lotions.   Remember to brush your teeth WITH YOUR REGULAR TOOTHPASTE.   Please read over the following fact sheets that you were given.

## 2020-11-29 ENCOUNTER — Encounter (HOSPITAL_COMMUNITY): Payer: Self-pay

## 2020-11-29 ENCOUNTER — Other Ambulatory Visit: Payer: Self-pay

## 2020-11-29 ENCOUNTER — Encounter (HOSPITAL_COMMUNITY)
Admission: RE | Admit: 2020-11-29 | Discharge: 2020-11-29 | Disposition: A | Discharge status: Home / Self Care | Payer: Medicare Other | Source: Ambulatory Visit | Attending: Neurosurgery | Admitting: Neurosurgery

## 2020-11-29 DIAGNOSIS — Z01812 Encounter for preprocedural laboratory examination: Secondary | ICD-10-CM | POA: Insufficient documentation

## 2020-11-29 DIAGNOSIS — Z20822 Contact with and (suspected) exposure to covid-19: Secondary | ICD-10-CM | POA: Insufficient documentation

## 2020-11-29 HISTORY — DX: Cardiac murmur, unspecified: R01.1

## 2020-11-29 HISTORY — DX: Nausea with vomiting, unspecified: R11.2

## 2020-11-29 LAB — CBC
HCT: 46.6 % — ABNORMAL HIGH (ref 36.0–46.0)
Hemoglobin: 14.8 g/dL (ref 12.0–15.0)
MCH: 28.7 pg (ref 26.0–34.0)
MCHC: 31.8 g/dL (ref 30.0–36.0)
MCV: 90.3 fL (ref 80.0–100.0)
Platelets: 254 10*3/uL (ref 150–400)
RBC: 5.16 MIL/uL — ABNORMAL HIGH (ref 3.87–5.11)
RDW: 14.3 % (ref 11.5–15.5)
WBC: 7.6 10*3/uL (ref 4.0–10.5)
nRBC: 0 % (ref 0.0–0.2)

## 2020-11-29 LAB — TYPE AND SCREEN
ABO/RH(D): O POS
Antibody Screen: NEGATIVE

## 2020-11-29 LAB — BASIC METABOLIC PANEL
Anion gap: 6 (ref 5–15)
BUN: 19 mg/dL (ref 8–23)
CO2: 28 mmol/L (ref 22–32)
Calcium: 10 mg/dL (ref 8.9–10.3)
Chloride: 105 mmol/L (ref 98–111)
Creatinine, Ser: 1.72 mg/dL — ABNORMAL HIGH (ref 0.44–1.00)
GFR, Estimated: 32 mL/min — ABNORMAL LOW (ref >60)
Glucose, Bld: 156 mg/dL — ABNORMAL HIGH (ref 70–99)
Potassium: 4.5 mmol/L (ref 3.5–5.1)
Sodium: 139 mmol/L (ref 135–145)

## 2020-11-29 LAB — SURGICAL PCR SCREEN
MRSA, PCR: NEGATIVE
Staphylococcus aureus: NEGATIVE

## 2020-11-29 LAB — SARS CORONAVIRUS 2 (TAT 6-24 HRS): SARS Coronavirus 2: NEGATIVE

## 2020-11-29 NOTE — Progress Notes (Signed)
PCP - Dr. Agustina Caroli Cardiologist - Dr. Gwyndolyn Kaufman in [place]  Chest x-ray - 06/17/20 EKG - 03/19/20 Stress Test - Yes April 2022 with Dr. Johney Frame ECHO - 04/13/20 Cardiac Cath - 2008 and 2009 no findings per patient  Sleep Study - 2008 no OSA  Aspirin Instructions: Last dose 11/21/20  COVID TEST- 11/29/20  Anesthesia review: Yes Cardiac history  Patient denies shortness of breath, fever, cough and chest pain at PAT appointment   All instructions explained to the patient, with a verbal understanding of the material. Patient agrees to go over the instructions while at home for a better understanding. Patient also instructed to wear a mask in public after being tested for COVID-19. The opportunity to ask questions was provided.

## 2020-11-30 NOTE — Anesthesia Preprocedure Evaluation (Addendum)
Anesthesia Evaluation  Patient identified by MRN, date of birth, ID band Patient awake    Reviewed: Allergy & Precautions, NPO status , Patient's Chart, lab work & pertinent test results, reviewed documented beta blocker date and time   Airway Mallampati: III  TM Distance: >3 FB Neck ROM: Full    Dental  (+) Edentulous Upper, Dental Advisory Given   Pulmonary neg pulmonary ROS,    Pulmonary exam normal breath sounds clear to auscultation       Cardiovascular hypertension, Pt. on home beta blockers and Pt. on medications + angina + CAD  Normal cardiovascular exam+ dysrhythmias Supra Ventricular Tachycardia  Rhythm:Regular Rate:Normal  Nuclear stress 07/08/2020: ? Nuclear stress EF: 67%. ? There was no ST segment deviation noted during stress. ? The study is normal. ? This is a low risk study. ? The left ventricular ejection fraction is hyperdynamic (>65%).  Normal pharmacologic nuclear stress test with no evidence for prior infarct or ischemia. LVEF 67%.  Event monitor 04/13/20: ? Predominant rhythm was NSR with average HR 76bpm (ranging from 56bpm to 190bpm) ? Patient had 250 runs of SVT with longest lasting 17 beats at average HR 117bpm. Fastest was 4 beats at rate 190bpm. ? Occasional PACs (2.5%; 34547 beats); PVCs were rare ? Rare couplets (<1%, 2956), rare triplets (<1% 1597) ? Patient triggered events mainly correlated with SR/sinus tachycardia with PACs. ? No Afib, VT or significant pauses  TTE 04/13/20: 1. Left ventricular ejection fraction, by estimation, is 65 to 70%. The  left ventricle has normal function. The left ventricle has no regional  wall motion abnormalities. There is mild concentric left ventricular  hypertrophy. Left ventricular diastolic  parameters are consistent with Grade I diastolic dysfunction (impaired  relaxation).  2. Right ventricular systolic function is normal. The right ventricular   size is normal. There is normal pulmonary artery systolic pressure. The  estimated right ventricular systolic pressure is 00.7 mmHg.  3. The mitral valve is normal in structure. Mild mitral valve  regurgitation. No evidence of mitral stenosis.  4. The aortic valve is normal in structure. Aortic valve regurgitation is  not visualized. No aortic stenosis is present.  5. The inferior vena cava is normal in size with greater than 50%  respiratory variability, suggesting right atrial pressure of 3 mmHg.   Neuro/Psych negative neurological ROS  negative psych ROS   GI/Hepatic negative GI ROS, Neg liver ROS,   Endo/Other  Morbid obesity (BMI 40)  Renal/GU ESRFRenal disease (ESRD s/p renal transplant)  negative genitourinary   Musculoskeletal negative musculoskeletal ROS (+)   Abdominal   Peds  Hematology negative hematology ROS (+)   Anesthesia Other Findings   Reproductive/Obstetrics                           Anesthesia Physical Anesthesia Plan  ASA: 3  Anesthesia Plan: General   Post-op Pain Management:    Induction: Intravenous  PONV Risk Score and Plan: 4 or greater and Midazolam, Dexamethasone and Ondansetron  Airway Management Planned: Oral ETT  Additional Equipment:   Intra-op Plan:   Post-operative Plan: Extubation in OR  Informed Consent: I have reviewed the patients History and Physical, chart, labs and discussed the procedure including the risks, benefits and alternatives for the proposed anesthesia with the patient or authorized representative who has indicated his/her understanding and acceptance.     Dental advisory given  Plan Discussed with: CRNA  Anesthesia Plan Comments:  Anesthesia Quick Evaluation  

## 2020-11-30 NOTE — Progress Notes (Signed)
Anesthesia Chart Review:  Recent evaluation by cardiology for palpitations, HTN, DOE.  Monitor with 250 runs of non-sustained SVT with longest episode 17 beats. Also with occasional PACs. No afib or sustained arrhythmias. Improved currently, pt declined BB. TTE with normal BiV function, no significant valvular abnormalities. CT chest with coronary calcium in the LM/LAD, however, myoview normal with no evidence of ischemia or infarction. Last seen by Dr. Johney Frame 09/28/20, recommended continue current medical therapies and increase physical activity. Followup in 1 year. Cardiac clearance per telephone encounter 10/20/2020, "Chart reviewed as part of pre-operative protocol coverage. Given past medical history and time since last visit, based on ACC/AHA guidelines, Megan Bradford would be at acceptable risk for the planned procedure without further cardiovascular testing. She has no hx of CAD. She was recently seen in follow up with Dr. Johney Frame at which time her palpitations were much better controlled. Given no CAD and no hx of PCI, she may hold ASA 3-5 days prior to procedure and resume when safe from a bleeding standpoint per surgical team."  Hx of ESRD s/p renal transplant (CD DDKT 08/20/2014 at [facility] with CKD stage IIIb, followed at [facility].  Per notes, baseline creatinine ~1.6-1.8. her nephrologist is Dr. Newt Lukes [facility].  Preop labs reviewed, creatinine elevated 1.72 c/w history, otherwise unremarkable.   EKG 03/18/20: Normal sinus rhythm. Rate 72. Rightward axis. Nonspecific ST and T wave abnormality  Nuclear stress 07/08/2020: Nuclear stress EF: 67%. There was no ST segment deviation noted during stress. The study is normal. This is a low risk study. The left ventricular ejection fraction is hyperdynamic (>65%).   Normal pharmacologic nuclear stress test with no evidence for prior infarct or ischemia. LVEF 67%.  Event monitor  04/13/20: Predominant rhythm was NSR with average HR 76bpm (ranging from 56bpm to 190bpm) Patient had 250 runs of SVT with longest lasting 17 beats at average HR 117bpm. Fastest was 4 beats at rate 190bpm. Occasional PACs (2.5%; 34547 beats); PVCs were rare Rare couplets (<1%, 2956), rare triplets (<1% 1597) Patient triggered events mainly correlated with SR/sinus tachycardia with PACs. No Afib, VT or significant pauses  TTE 04/13/20:  1. Left ventricular ejection fraction, by estimation, is 65 to 70%. The  left ventricle has normal function. The left ventricle has no regional  wall motion abnormalities. There is mild concentric left ventricular  hypertrophy. Left ventricular diastolic  parameters are consistent with Grade I diastolic dysfunction (impaired  relaxation).   2. Right ventricular systolic function is normal. The right ventricular  size is normal. There is normal pulmonary artery systolic pressure. The  estimated right ventricular systolic pressure is 84.6 mmHg.   3. The mitral valve is normal in structure. Mild mitral valve  regurgitation. No evidence of mitral stenosis.   4. The aortic valve is normal in structure. Aortic valve regurgitation is  not visualized. No aortic stenosis is present.   5. The inferior vena cava is normal in size with greater than 50%  respiratory variability, suggesting right atrial pressure of 3 mmHg.   [facility] Short Stay Center/Anesthesiology Phone [phone] 11/30/2020 10:52 AM

## 2020-12-01 ENCOUNTER — Inpatient Hospital Stay (HOSPITAL_COMMUNITY)
Admission: RE | Admit: 2020-12-01 | Discharge: 2020-12-02 | DRG: 454 | Disposition: A | Discharge status: Home / Self Care | Payer: Medicare Other | Attending: Neurosurgery | Admitting: Neurosurgery

## 2020-12-01 ENCOUNTER — Inpatient Hospital Stay (HOSPITAL_COMMUNITY): Payer: Medicare Other

## 2020-12-01 ENCOUNTER — Inpatient Hospital Stay (HOSPITAL_COMMUNITY): Payer: Medicare Other | Admitting: Physician Assistant

## 2020-12-01 ENCOUNTER — Encounter (HOSPITAL_COMMUNITY): Payer: Self-pay

## 2020-12-01 ENCOUNTER — Other Ambulatory Visit: Payer: Self-pay

## 2020-12-01 ENCOUNTER — Inpatient Hospital Stay (HOSPITAL_COMMUNITY): Payer: Medicare Other | Admitting: Certified Registered"

## 2020-12-01 ENCOUNTER — Encounter (HOSPITAL_COMMUNITY)
Admission: RE | Disposition: A | Discharge status: Home / Self Care | Payer: Self-pay | Source: Home / Self Care | Attending: Neurosurgery

## 2020-12-01 DIAGNOSIS — I129 Hypertensive chronic kidney disease with stage 1 through stage 4 chronic kidney disease, or unspecified chronic kidney disease: Secondary | ICD-10-CM | POA: Diagnosis present

## 2020-12-01 DIAGNOSIS — Z888 Allergy status to other drugs, medicaments and biological substances status: Secondary | ICD-10-CM

## 2020-12-01 DIAGNOSIS — N1832 Chronic kidney disease, stage 3b: Secondary | ICD-10-CM | POA: Diagnosis present

## 2020-12-01 DIAGNOSIS — Z20822 Contact with and (suspected) exposure to covid-19: Secondary | ICD-10-CM | POA: Diagnosis present

## 2020-12-01 DIAGNOSIS — Z86711 Personal history of pulmonary embolism: Secondary | ICD-10-CM

## 2020-12-01 DIAGNOSIS — Z825 Family history of asthma and other chronic lower respiratory diseases: Secondary | ICD-10-CM | POA: Diagnosis not present

## 2020-12-01 DIAGNOSIS — Z8249 Family history of ischemic heart disease and other diseases of the circulatory system: Secondary | ICD-10-CM | POA: Diagnosis not present

## 2020-12-01 DIAGNOSIS — M48061 Spinal stenosis, lumbar region without neurogenic claudication: Secondary | ICD-10-CM | POA: Diagnosis present

## 2020-12-01 DIAGNOSIS — I2510 Atherosclerotic heart disease of native coronary artery without angina pectoris: Secondary | ICD-10-CM | POA: Diagnosis present

## 2020-12-01 DIAGNOSIS — Z833 Family history of diabetes mellitus: Secondary | ICD-10-CM | POA: Diagnosis not present

## 2020-12-01 DIAGNOSIS — E785 Hyperlipidemia, unspecified: Secondary | ICD-10-CM | POA: Diagnosis present

## 2020-12-01 DIAGNOSIS — Z6839 Body mass index (BMI) 39.0-39.9, adult: Secondary | ICD-10-CM

## 2020-12-01 DIAGNOSIS — M810 Age-related osteoporosis without current pathological fracture: Secondary | ICD-10-CM | POA: Diagnosis present

## 2020-12-01 DIAGNOSIS — M4316 Spondylolisthesis, lumbar region: Secondary | ICD-10-CM | POA: Diagnosis present

## 2020-12-01 DIAGNOSIS — Z940 Kidney transplant status: Secondary | ICD-10-CM | POA: Diagnosis not present

## 2020-12-01 DIAGNOSIS — Z419 Encounter for procedure for purposes other than remedying health state, unspecified: Secondary | ICD-10-CM

## 2020-12-01 HISTORY — PX: TRANSFORAMINAL LUMBAR INTERBODY FUSION (TLIF) WITH PEDICLE SCREW FIXATION 1 LEVEL: SHX6141

## 2020-12-01 LAB — ABO/RH: ABO/RH(D): O POS

## 2020-12-01 SURGERY — TRANSFORAMINAL LUMBAR INTERBODY FUSION (TLIF) WITH PEDICLE SCREW FIXATION 1 LEVEL
Anesthesia: General

## 2020-12-01 MED ORDER — EPHEDRINE 5 MG/ML INJ
INTRAVENOUS | Status: AC
  Filled 2020-12-01: qty 5

## 2020-12-01 MED ORDER — DEXAMETHASONE SODIUM PHOSPHATE 10 MG/ML IJ SOLN
INTRAMUSCULAR | Status: AC
  Filled 2020-12-01: qty 1

## 2020-12-01 MED ORDER — OXYCODONE-ACETAMINOPHEN 5-325 MG PO TABS: 1.0000 | ORAL_TABLET | ORAL | Status: DC | PRN

## 2020-12-01 MED ORDER — ACETAMINOPHEN 500 MG PO TABS
1000.0000 mg | ORAL_TABLET | Freq: Once | ORAL | Status: AC
  Administered 2020-12-01: 1000 mg via ORAL
  Filled 2020-12-01: qty 2

## 2020-12-01 MED ORDER — VITAMIN B-12 1000 MCG PO TABS
500.0000 ug | ORAL_TABLET | Freq: Every day | ORAL | Status: DC
  Filled 2020-12-01: qty 1

## 2020-12-01 MED ORDER — ROSUVASTATIN CALCIUM 20 MG PO TABS
20.0000 mg | ORAL_TABLET | Freq: Every day | ORAL | Status: DC
  Administered 2020-12-01: 20 mg via ORAL
  Filled 2020-12-01: qty 1

## 2020-12-01 MED ORDER — SUFENTANIL CITRATE 50 MCG/ML IV SOLN
INTRAVENOUS | Status: AC
  Filled 2020-12-01: qty 1

## 2020-12-01 MED ORDER — BUPIVACAINE HCL (PF) 0.5 % IJ SOLN
INTRAMUSCULAR | Status: AC
  Filled 2020-12-01: qty 30

## 2020-12-01 MED ORDER — CHLORHEXIDINE GLUCONATE CLOTH 2 % EX PADS: 6.0000 | MEDICATED_PAD | Freq: Once | CUTANEOUS | Status: DC

## 2020-12-01 MED ORDER — ONDANSETRON HCL 4 MG/2ML IJ SOLN
4.0000 mg | Freq: Four times a day (QID) | INTRAMUSCULAR | Status: DC | PRN
  Administered 2020-12-01: 4 mg via INTRAVENOUS
  Filled 2020-12-01: qty 2

## 2020-12-01 MED ORDER — BUPIVACAINE LIPOSOME 1.3 % IJ SUSP
INTRAMUSCULAR | Status: AC
  Filled 2020-12-01: qty 20

## 2020-12-01 MED ORDER — PHENYLEPHRINE 40 MCG/ML (10ML) SYRINGE FOR IV PUSH (FOR BLOOD PRESSURE SUPPORT)
PREFILLED_SYRINGE | INTRAVENOUS | Status: AC
  Filled 2020-12-01: qty 10

## 2020-12-01 MED ORDER — SODIUM CHLORIDE 0.9 % IV SOLN
250.0000 mL | INTRAVENOUS | Status: DC
  Administered 2020-12-01: 250 mL via INTRAVENOUS

## 2020-12-01 MED ORDER — FENTANYL CITRATE (PF) 100 MCG/2ML IJ SOLN
25.0000 ug | INTRAMUSCULAR | Status: DC | PRN
  Administered 2020-12-01 (×2): 50 ug via INTRAVENOUS

## 2020-12-01 MED ORDER — ASPIRIN EC 81 MG PO TBEC: 81.0000 mg | DELAYED_RELEASE_TABLET | Freq: Every day | ORAL | Status: DC

## 2020-12-01 MED ORDER — SODIUM CHLORIDE 0.9% FLUSH: 3.0000 mL | Freq: Two times a day (BID) | INTRAVENOUS | Status: DC

## 2020-12-01 MED ORDER — LIDOCAINE HCL (CARDIAC) PF 100 MG/5ML IV SOSY
PREFILLED_SYRINGE | INTRAVENOUS | Status: DC | PRN
Start: 2020-12-01 — End: 2020-12-01
  Administered 2020-12-01: 100 mg via INTRAVENOUS

## 2020-12-01 MED ORDER — ONDANSETRON HCL 4 MG PO TABS: 4.0000 mg | ORAL_TABLET | Freq: Four times a day (QID) | ORAL | Status: DC | PRN

## 2020-12-01 MED ORDER — CYCLOBENZAPRINE HCL 10 MG PO TABS
10.0000 mg | ORAL_TABLET | Freq: Three times a day (TID) | ORAL | Status: DC | PRN
  Filled 2020-12-01: qty 1

## 2020-12-01 MED ORDER — CARVEDILOL 12.5 MG PO TABS
12.5000 mg | ORAL_TABLET | Freq: Two times a day (BID) | ORAL | Status: DC
  Administered 2020-12-01 – 2020-12-02 (×3): 12.5 mg via ORAL
  Filled 2020-12-01 (×2): qty 1

## 2020-12-01 MED ORDER — VANCOMYCIN HCL IN DEXTROSE 1-5 GM/200ML-% IV SOLN: 1000.0000 mg | INTRAVENOUS | Status: AC

## 2020-12-01 MED ORDER — HYDROMORPHONE HCL 1 MG/ML IJ SOLN
0.5000 mg | INTRAMUSCULAR | Status: DC | PRN
  Administered 2020-12-01: 0.5 mg via INTRAVENOUS
  Filled 2020-12-01: qty 0.5

## 2020-12-01 MED ORDER — ACETAMINOPHEN 325 MG PO TABS: 650.0000 mg | ORAL_TABLET | ORAL | Status: DC | PRN

## 2020-12-01 MED ORDER — FENTANYL CITRATE (PF) 100 MCG/2ML IJ SOLN
INTRAMUSCULAR | Status: AC
  Filled 2020-12-01: qty 2

## 2020-12-01 MED ORDER — FLEET ENEMA 7-19 GM/118ML RE ENEM: 1.0000 | ENEMA | Freq: Once | RECTAL | Status: DC | PRN

## 2020-12-01 MED ORDER — TACROLIMUS 1 MG PO CAPS: 2.0000 mg | ORAL_CAPSULE | ORAL | Status: DC

## 2020-12-01 MED ORDER — LIDOCAINE-EPINEPHRINE 1 %-1:100000 IJ SOLN
INTRAMUSCULAR | Status: AC
  Filled 2020-12-01: qty 1

## 2020-12-01 MED ORDER — ORAL CARE MOUTH RINSE: 15.0000 mL | Freq: Once | OROMUCOSAL | Status: AC

## 2020-12-01 MED ORDER — PROSIGHT PO TABS
1.0000 | ORAL_TABLET | Freq: Every day | ORAL | Status: DC
  Filled 2020-12-01: qty 1

## 2020-12-01 MED ORDER — POTASSIUM CHLORIDE IN NACL 20-0.9 MEQ/L-% IV SOLN: INTRAVENOUS | Status: DC

## 2020-12-01 MED ORDER — SODIUM CHLORIDE 0.9% FLUSH: 3.0000 mL | INTRAVENOUS | Status: DC | PRN

## 2020-12-01 MED ORDER — MENTHOL 3 MG MT LOZG: 1.0000 | LOZENGE | OROMUCOSAL | Status: DC | PRN

## 2020-12-01 MED ORDER — PROPOFOL 10 MG/ML IV BOLUS
INTRAVENOUS | Status: AC
  Filled 2020-12-01: qty 20

## 2020-12-01 MED ORDER — LACTATED RINGERS IV SOLN: INTRAVENOUS | Status: DC | PRN

## 2020-12-01 MED ORDER — GLYCOPYRROLATE 0.2 MG/ML IJ SOLN
INTRAMUSCULAR | Status: DC | PRN
  Administered 2020-12-01: .2 mg via INTRAVENOUS

## 2020-12-01 MED ORDER — ROCURONIUM BROMIDE 10 MG/ML (PF) SYRINGE
PREFILLED_SYRINGE | INTRAVENOUS | Status: AC
  Filled 2020-12-01: qty 10

## 2020-12-01 MED ORDER — DEXAMETHASONE SODIUM PHOSPHATE 10 MG/ML IJ SOLN
INTRAMUSCULAR | Status: DC | PRN
  Administered 2020-12-01: 10 mg via INTRAVENOUS

## 2020-12-01 MED ORDER — DOCUSATE SODIUM 100 MG PO CAPS
100.0000 mg | ORAL_CAPSULE | Freq: Two times a day (BID) | ORAL | Status: DC
  Administered 2020-12-01 – 2020-12-02 (×2): 100 mg via ORAL
  Filled 2020-12-01 (×3): qty 1

## 2020-12-01 MED ORDER — CHLORHEXIDINE GLUCONATE 0.12 % MT SOLN: 15.0000 mL | Freq: Once | OROMUCOSAL | Status: AC

## 2020-12-01 MED ORDER — LIDOCAINE-EPINEPHRINE 1 %-1:100000 IJ SOLN
INTRAMUSCULAR | Status: DC | PRN
  Administered 2020-12-01: 5 mL

## 2020-12-01 MED ORDER — PROPOFOL 10 MG/ML IV BOLUS
INTRAVENOUS | Status: DC | PRN
  Administered 2020-12-01: 130 mg via INTRAVENOUS

## 2020-12-01 MED ORDER — HYDROCODONE-ACETAMINOPHEN 5-325 MG PO TABS
1.0000 | ORAL_TABLET | ORAL | Status: DC | PRN
  Administered 2020-12-01: 2 via ORAL
  Administered 2020-12-02 (×2): 1 via ORAL
  Administered 2020-12-02: 2 via ORAL
  Filled 2020-12-01 (×2): qty 1
  Filled 2020-12-01 (×2): qty 2
  Filled 2020-12-01: qty 1

## 2020-12-01 MED ORDER — HYDROXYZINE HCL 50 MG/ML IM SOLN: 50.0000 mg | Freq: Three times a day (TID) | INTRAMUSCULAR | Status: DC | PRN

## 2020-12-01 MED ORDER — PHENOL 1.4 % MT LIQD
1.0000 | OROMUCOSAL | Status: DC | PRN
  Administered 2020-12-02: 1 via OROMUCOSAL
  Filled 2020-12-01: qty 177

## 2020-12-01 MED ORDER — MIDAZOLAM HCL 2 MG/2ML IJ SOLN
INTRAMUSCULAR | Status: DC | PRN
  Administered 2020-12-01: 2 mg via INTRAVENOUS

## 2020-12-01 MED ORDER — VITAMIN D 25 MCG (1000 UNIT) PO TABS
500.0000 [IU] | ORAL_TABLET | Freq: Every day | ORAL | Status: DC
  Filled 2020-12-01: qty 1

## 2020-12-01 MED ORDER — ACETAMINOPHEN 650 MG RE SUPP: 650.0000 mg | RECTAL | Status: DC | PRN

## 2020-12-01 MED ORDER — SUGAMMADEX SODIUM 200 MG/2ML IV SOLN
INTRAVENOUS | Status: DC | PRN
  Administered 2020-12-01: 200 mg via INTRAVENOUS

## 2020-12-01 MED ORDER — VANCOMYCIN HCL IN DEXTROSE 1-5 GM/200ML-% IV SOLN
1000.0000 mg | INTRAVENOUS | Status: DC
Start: 2020-12-01 — End: 2020-12-03
  Administered 2020-12-01: 1000 mg via INTRAVENOUS

## 2020-12-01 MED ORDER — THROMBIN 20000 UNITS EX SOLR
CUTANEOUS | Status: AC
  Filled 2020-12-01: qty 20000

## 2020-12-01 MED ORDER — POLYETHYLENE GLYCOL 3350 17 G PO PACK: 17.0000 g | PACK | Freq: Every day | ORAL | Status: DC | PRN

## 2020-12-01 MED ORDER — 0.9 % SODIUM CHLORIDE (POUR BTL) OPTIME
TOPICAL | Status: DC | PRN
  Administered 2020-12-01: 1000 mL

## 2020-12-01 MED ORDER — ONDANSETRON HCL 4 MG/2ML IJ SOLN
INTRAMUSCULAR | Status: AC
  Filled 2020-12-01: qty 2

## 2020-12-01 MED ORDER — PHENYLEPHRINE 40 MCG/ML (10ML) SYRINGE FOR IV PUSH (FOR BLOOD PRESSURE SUPPORT)
PREFILLED_SYRINGE | INTRAVENOUS | Status: DC | PRN
  Administered 2020-12-01: 200 ug via INTRAVENOUS

## 2020-12-01 MED ORDER — ENOXAPARIN SODIUM 40 MG/0.4ML IJ SOSY
40.0000 mg | PREFILLED_SYRINGE | INTRAMUSCULAR | Status: DC
  Administered 2020-12-02: 40 mg via SUBCUTANEOUS
  Filled 2020-12-01: qty 0.4

## 2020-12-01 MED ORDER — LACTATED RINGERS IV SOLN: INTRAVENOUS | Status: DC

## 2020-12-01 MED ORDER — PHENYLEPHRINE HCL-NACL 10-0.9 MG/250ML-% IV SOLN
INTRAVENOUS | Status: DC | PRN
  Administered 2020-12-01: 50 ug/min via INTRAVENOUS

## 2020-12-01 MED ORDER — TACROLIMUS 1 MG PO CAPS
4.0000 mg | ORAL_CAPSULE | Freq: Every day | ORAL | Status: DC
  Administered 2020-12-02: 4 mg via ORAL
  Filled 2020-12-01: qty 4

## 2020-12-01 MED ORDER — SODIUM CHLORIDE (PF) 0.9 % IJ SOLN
INTRAMUSCULAR | Status: AC
  Filled 2020-12-01: qty 10

## 2020-12-01 MED ORDER — AMLODIPINE BESYLATE 5 MG PO TABS
5.0000 mg | ORAL_TABLET | Freq: Every day | ORAL | Status: DC
  Administered 2020-12-02: 5 mg via ORAL
  Filled 2020-12-01: qty 1

## 2020-12-01 MED ORDER — ROCURONIUM 10MG/ML (10ML) SYRINGE FOR MEDFUSION PUMP - OPTIME
INTRAVENOUS | Status: DC | PRN
  Administered 2020-12-01: 100 mg via INTRAVENOUS

## 2020-12-01 MED ORDER — TACROLIMUS 1 MG PO CAPS
2.0000 mg | ORAL_CAPSULE | Freq: Every day | ORAL | Status: DC
  Administered 2020-12-01: 2 mg via ORAL
  Filled 2020-12-01 (×2): qty 2

## 2020-12-01 MED ORDER — THROMBIN 5000 UNITS EX SOLR
CUTANEOUS | Status: AC
  Filled 2020-12-01: qty 5000

## 2020-12-01 MED ORDER — SODIUM CHLORIDE (PF) 0.9 % IJ SOLN
INTRAMUSCULAR | Status: DC | PRN
Start: 2020-12-01 — End: 2020-12-01
  Administered 2020-12-01: 10 mL via INTRAVENOUS

## 2020-12-01 MED ORDER — CO Q10 60 MG PO CAPS: ORAL_CAPSULE | Freq: Every day | ORAL | Status: DC

## 2020-12-01 MED ORDER — CHLORHEXIDINE GLUCONATE 0.12 % MT SOLN
OROMUCOSAL | Status: AC
  Administered 2020-12-01: 15 mL via OROMUCOSAL
  Filled 2020-12-01: qty 15

## 2020-12-01 MED ORDER — THROMBIN 5000 UNITS EX SOLR
OROMUCOSAL | Status: DC | PRN
  Administered 2020-12-01: 5 mL via TOPICAL

## 2020-12-01 MED ORDER — BUPIVACAINE HCL (PF) 0.5 % IJ SOLN
INTRAMUSCULAR | Status: DC | PRN
  Administered 2020-12-01: 25 mL
  Administered 2020-12-01: 5 mL

## 2020-12-01 MED ORDER — SUFENTANIL CITRATE 50 MCG/ML IV SOLN
INTRAVENOUS | Status: DC | PRN
  Administered 2020-12-01 (×2): 10 ug via INTRAVENOUS

## 2020-12-01 MED ORDER — GLYCOPYRROLATE PF 0.2 MG/ML IJ SOSY
PREFILLED_SYRINGE | INTRAMUSCULAR | Status: AC
  Filled 2020-12-01: qty 1

## 2020-12-01 MED ORDER — ALBUMIN HUMAN 5 % IV SOLN: INTRAVENOUS | Status: DC | PRN

## 2020-12-01 MED ORDER — ONDANSETRON HCL 4 MG/2ML IJ SOLN
INTRAMUSCULAR | Status: DC | PRN
  Administered 2020-12-01 (×2): 4 mg via INTRAVENOUS

## 2020-12-01 MED ORDER — MYCOPHENOLATE SODIUM 180 MG PO TBEC
180.0000 mg | DELAYED_RELEASE_TABLET | Freq: Two times a day (BID) | ORAL | Status: DC
  Administered 2020-12-01 – 2020-12-02 (×2): 180 mg via ORAL
  Filled 2020-12-01 (×3): qty 1

## 2020-12-01 MED ORDER — VANCOMYCIN HCL IN DEXTROSE 1-5 GM/200ML-% IV SOLN
INTRAVENOUS | Status: AC
  Administered 2020-12-01: 1000 mg via INTRAVENOUS
  Filled 2020-12-01: qty 200

## 2020-12-01 MED ORDER — MAGNESIUM OXIDE -MG SUPPLEMENT 400 (240 MG) MG PO TABS
400.0000 mg | ORAL_TABLET | Freq: Every day | ORAL | Status: DC
  Administered 2020-12-02: 400 mg via ORAL
  Filled 2020-12-01: qty 1

## 2020-12-01 MED ORDER — MIDAZOLAM HCL 2 MG/2ML IJ SOLN
INTRAMUSCULAR | Status: AC
  Filled 2020-12-01: qty 2

## 2020-12-01 MED ORDER — EPHEDRINE SULFATE 50 MG/ML IJ SOLN
INTRAMUSCULAR | Status: DC | PRN
  Administered 2020-12-01: 10 mg via INTRAVENOUS

## 2020-12-01 SURGICAL SUPPLY — 78 items
BAG COUNTER SPONGE SURGICOUNT (BAG) ×4 IMPLANT
BAND RUBBER #18 3X1/16 STRL (MISCELLANEOUS) IMPLANT
BASKET BONE COLLECTION (BASKET) ×2 IMPLANT
BLADE BN FN 3.2XSTRL LF (MISCELLANEOUS) ×1 IMPLANT
BLADE BONE MILL FINE (MISCELLANEOUS) ×1
BLADE CLIPPER SURG (BLADE) IMPLANT
BUR CARBIDE MATCH 3.0 (BURR) ×2 IMPLANT
BUR MATCHSTICK NEURO 3.0 LAGG (BURR) IMPLANT
BUR PRECISION FLUTE 5.0 (BURR) ×2 IMPLANT
CANISTER SUCT 3000ML PPV (MISCELLANEOUS) ×2 IMPLANT
CNTNR URN SCR LID CUP LEK RST (MISCELLANEOUS) ×1 IMPLANT
CONT SPEC 4OZ STRL OR WHT (MISCELLANEOUS) ×1
COVER BACK TABLE 60X90IN (DRAPES) ×2 IMPLANT
DECANTER SPIKE VIAL GLASS SM (MISCELLANEOUS) ×2 IMPLANT
DERMABOND ADVANCED (GAUZE/BANDAGES/DRESSINGS) ×1
DERMABOND ADVANCED .7 DNX12 (GAUZE/BANDAGES/DRESSINGS) ×1 IMPLANT
DRAIN JACKSON PRATT 10MM FLAT (MISCELLANEOUS) ×2 IMPLANT
DRAPE 3/4 80X56 (DRAPES) ×2 IMPLANT
DRAPE C-ARM 42X72 X-RAY (DRAPES) ×2 IMPLANT
DRAPE C-ARMOR (DRAPES) ×2 IMPLANT
DRAPE LAPAROTOMY 100X72X124 (DRAPES) ×2 IMPLANT
DRAPE MICROSCOPE LEICA (MISCELLANEOUS) IMPLANT
DRSG OPSITE POSTOP 4X6 (GAUZE/BANDAGES/DRESSINGS) ×2 IMPLANT
DURAPREP 26ML APPLICATOR (WOUND CARE) ×2 IMPLANT
ELECT BLADE INSULATED 4IN (ELECTROSURGICAL) ×2
ELECT BLADE INSULATED 6.5IN (ELECTROSURGICAL)
ELECT REM PT RETURN 9FT ADLT (ELECTROSURGICAL) ×2
ELECTRODE BLADE INSULATED 4IN (ELECTROSURGICAL) ×1 IMPLANT
ELECTRODE BLDE INSULATED 6.5IN (ELECTROSURGICAL) IMPLANT
ELECTRODE REM PT RTRN 9FT ADLT (ELECTROSURGICAL) ×1 IMPLANT
EVACUATOR SILICONE 100CC (DRAIN) ×2 IMPLANT
GAUZE 4X4 16PLY ~~LOC~~+RFID DBL (SPONGE) ×4 IMPLANT
GAUZE SPONGE 4X4 12PLY STRL (GAUZE/BANDAGES/DRESSINGS) ×2 IMPLANT
GLOVE EXAM NITRILE XL STR (GLOVE) IMPLANT
GLOVE SRG 8 PF TXTR STRL LF DI (GLOVE) ×3 IMPLANT
GLOVE SURG LTX SZ7.5 (GLOVE) ×6 IMPLANT
GLOVE SURG UNDER POLY LF SZ6.5 (GLOVE) ×4 IMPLANT
GLOVE SURG UNDER POLY LF SZ7.5 (GLOVE) ×8 IMPLANT
GLOVE SURG UNDER POLY LF SZ8 (GLOVE) ×3
GOWN STRL REUS W/ TWL LRG LVL3 (GOWN DISPOSABLE) ×1 IMPLANT
GOWN STRL REUS W/ TWL XL LVL3 (GOWN DISPOSABLE) ×2 IMPLANT
GOWN STRL REUS W/TWL 2XL LVL3 (GOWN DISPOSABLE) ×4 IMPLANT
GOWN STRL REUS W/TWL LRG LVL3 (GOWN DISPOSABLE) ×1
GOWN STRL REUS W/TWL XL LVL3 (GOWN DISPOSABLE) ×2
HEMOSTAT POWDER KIT SURGIFOAM (HEMOSTASIS) ×2 IMPLANT
KIT BASIN OR (CUSTOM PROCEDURE TRAY) ×2 IMPLANT
KIT INFUSE XX SMALL 0.7CC (Orthopedic Implant) ×2 IMPLANT
KIT TURNOVER KIT B (KITS) ×2 IMPLANT
MARKER SKIN DUAL TIP RULER LAB (MISCELLANEOUS) ×2 IMPLANT
MILL MEDIUM DISP (BLADE) IMPLANT
NEEDLE HYPO 18GX1.5 BLUNT FILL (NEEDLE) IMPLANT
NEEDLE HYPO 21X1.5 SAFETY (NEEDLE) ×2 IMPLANT
NEEDLE HYPO 22GX1.5 SAFETY (NEEDLE) ×2 IMPLANT
NEEDLE SPNL 18GX3.5 QUINCKE PK (NEEDLE) ×2 IMPLANT
NS IRRIG 1000ML POUR BTL (IV SOLUTION) ×2 IMPLANT
PACK LAMINECTOMY NEURO (CUSTOM PROCEDURE TRAY) ×2 IMPLANT
PAD ARMBOARD 7.5X6 YLW CONV (MISCELLANEOUS) ×4 IMPLANT
PUTTY GRAFTON DBF 6CC W/DELIVE (Putty) ×2 IMPLANT
ROD SPINAL 5.5X35 CP 4 TI (Rod) ×4 IMPLANT
SCREW 6.5X40 (Screw) ×2 IMPLANT
SCREW SET SOLERA (Screw) ×4 IMPLANT
SCREW SET SOLERA TI5.5 (Screw) ×4 IMPLANT
SCREW SOLERA 45X6.5XMA NS SPNE (Screw) ×4 IMPLANT
SCREW SOLERA 6.5X45MM (Screw) ×4 IMPLANT
SPACER CATALYFT PL40 SHORT 9 (Spacer) ×2 IMPLANT
SPONGE SURGIFOAM ABS GEL 100 (HEMOSTASIS) IMPLANT
SPONGE T-LAP 4X18 ~~LOC~~+RFID (SPONGE) ×4 IMPLANT
SUT MNCRL AB 4-0 PS2 18 (SUTURE) ×2 IMPLANT
SUT VIC AB 0 CT1 18XCR BRD8 (SUTURE) ×2 IMPLANT
SUT VIC AB 0 CT1 8-18 (SUTURE) ×2
SUT VIC AB 2-0 CP2 18 (SUTURE) ×4 IMPLANT
SYR 30ML LL (SYRINGE) ×2 IMPLANT
TIP KERRISON THIN FOOTPLATE 3M (MISCELLANEOUS) ×2 IMPLANT
TIP KERRISON THIN FOOTPLATE 4M (MISCELLANEOUS) ×2 IMPLANT
TOWEL GREEN STERILE (TOWEL DISPOSABLE) ×2 IMPLANT
TOWEL GREEN STERILE FF (TOWEL DISPOSABLE) ×2 IMPLANT
TRAY FOLEY MTR SLVR 16FR STAT (SET/KITS/TRAYS/PACK) ×2 IMPLANT
WATER STERILE IRR 1000ML POUR (IV SOLUTION) ×2 IMPLANT

## 2020-12-01 NOTE — Transfer of Care (Signed)
Immediate Anesthesia Transfer of Care Note  Patient: Megan Bradford  Procedure(s) Performed: Transiforaminal Lumbar Interbody Fusion Lumbar four--Lumbar five  Patient Location: PACU  Anesthesia Type:General  Level of Consciousness: oriented, drowsy, patient cooperative and responds to stimulation  Airway & Oxygen Therapy: Patient Spontanous Breathing and Patient connected to nasal cannula oxygen  Post-op Assessment: Report given to RN, Post -op Vital signs reviewed and stable and Patient moving all extremities X 4  Post vital signs: Reviewed and stable  Last Vitals:  Vitals Value Taken Time  BP 139/94 12/01/20 1408  Temp    Pulse 66 12/01/20 1412  Resp 22 12/01/20 1412  SpO2 100 % 12/01/20 1412  Vitals shown include unvalidated device data.  Last Pain:  Vitals:   12/01/20 0657  TempSrc:   PainSc: 5       Patients Stated Pain Goal: 2 (37/35/78 9784)  Complications: No notable events documented.

## 2020-12-01 NOTE — H&P (Signed)
CC: bilateral leg pain and back pain  HPI:     Patient is a 70 y.o. female with hx of renal transplant who has had 1 year of progressively worse back pain and L>R leg pain.  She was found to have severe lumbar stenosis and spondylolisthesis.  She is here for elective spine surgery.    Patient Active Problem List   Diagnosis Date Noted   Trigger finger, right middle finger 10/28/2020   Coronary artery disease of native artery of native heart with stable angina pectoris ([place]) 08/30/2020   Immunosuppression ([place]) 08/30/2020   Stage 3b chronic kidney disease ([place]) 12/17/2019   History of kidney transplant 11/27/2019   Essential hypertension 11/27/2019   Dyslipidemia 11/27/2019   Prediabetes 11/27/2019   Body mass index (BMI) of 38.0-38.9 in adult 11/27/2019   Past Medical History:  Diagnosis Date   Cataract    Phreesia 11/24/2019   Chronic kidney disease    [employer]reesia 11/24/2019   Heart murmur    Hypertension    [employer]reesia 11/24/2019   Pulmonary embolism ([place]) 2010    Past Surgical History:  Procedure Laterality Date   CARDIAC CATHETERIZATION     CESAREAN SECTION N/A    [employer]reesia 11/24/2019   CHOLECYSTECTOMY N/A    [employer]reesia 11/24/2019   COLONOSCOPY     DILATION AND CURETTAGE OF UTERUS     EYE SURGERY N/A    [employer]reesia 11/24/2019   HIP SURGERY Left 1990   Hip dysplasia cleaned out per patient   TONSILLECTOMY     TRIGGER FINGER RELEASE Right 10/28/2020   Long finger    Medications Prior to Admission  Medication Sig Dispense Refill Last Dose   acetaminophen (TYLENOL) 650 MG CR tablet Take 650 mg by mouth every 8 (eight) hours as needed for pain.   11/30/2020   amLODipine (NORVASC) 5 MG tablet Take 5 mg by mouth daily.   12/01/2020 at 0530   carvedilol (COREG) 12.5 MG tablet Take 12.5 mg by mouth 2 (two) times daily with a meal.   12/01/2020 at 0530   Cholecalciferol (VITAMIN D3) 10 MCG (400 UNIT) CAPS Take 400 Units by mouth daily.   Past Week   Coenzyme Q10 (CO Q10 PO) Take 1  capsule by mouth daily.   Past Week   Cyanocobalamin (VITAMIN B 12 PO) Take 1 tablet by mouth daily.   Past Week   magnesium oxide (MAG-OX) 400 MG tablet Take 400 mg by mouth daily.   Past Week   Menthol-Methyl Salicylate (SALONPAS PAIN RELIEF PATCH) PTCH Place 1 patch onto the skin daily as needed (back pain).   Past Week   Misc Natural Products (NEURIVA) CHEW Chew 2 tablets by mouth daily.   Past Week   multivitamin-lutein (OCUVITE-LUTEIN) CAPS capsule Take 1 capsule by mouth daily.   Past Week   mycophenolate (MYFORTIC) 180 MG EC tablet Take 180 mg by mouth 2 (two) times daily.   12/01/2020 at 0530   rosuvastatin (CRESTOR) 20 MG tablet Take 1 tablet (20 mg total) by mouth daily. (Patient taking differently: Take 20 mg by mouth at bedtime.) 90 tablet 3 11/30/2020   tacrolimus (PROGRAF) 1 MG capsule Take 4 capsules by mouth in the morning.  Take 2 capsules by mouth in the evening. (Patient taking differently: Take 2-4 mg by mouth See admin instructions. Take 4 mg by mouth in the morning and 2 mg in the evening.) 540 capsule 3 12/01/2020 at 0530   aspirin EC 81 MG tablet Take 81 mg  by mouth daily. Swallow whole. (Patient not taking: No sig reported)   11/22/2020   diclofenac Sodium (VOLTAREN) 1 % GEL Apply 2 g topically 4 (four) times daily. (Patient not taking: No sig reported) 100 g 0 Not Taking   HYDROcodone-acetaminophen (NORCO) 5-325 MG tablet Take 1-2 tablets by mouth daily as needed. (Patient not taking: Reported on 11/24/2020) 20 tablet 0 Not Taking   ondansetron (ZOFRAN) 4 MG tablet Take 1 tablet (4 mg total) by mouth every 8 (eight) hours as needed for nausea or vomiting. 40 tablet 0 More than a month   Allergies  Allergen Reactions   Keflex [Cephalexin] Anaphylaxis   Ibuprofen     Raises blood pressure   Xanax [Alprazolam] Other (See Comments)    Per patient it does not make me "violent"    Social History   Tobacco Use   Smoking status: Never   Smokeless tobacco: Never  Substance Use  Topics   Alcohol use: Never    Family History  Problem Relation Age of Onset   Diabetes Mother    Asthma Mother    Hypertension Father    Coronary artery disease Father      Review of Systems Pertinent items are noted in HPI.  Objective:   Patient Vitals for the past 8 hrs:  BP Temp Temp src Pulse Resp SpO2 Height Weight  12/01/20 0650 129/78 98.4 F (36.9 C) Oral 76 18 99 % 5' (1.524 m) 92.1 kg   No intake/output data recorded. No intake/output data recorded.      General : Alert, cooperative, no distress, appears stated age. obese   Head:  Normocephalic/atraumatic    Eyes: PERRL, conjunctiva/corneas clear, EOM's intact. Fundi could not be visualized Neck: Supple Chest:  Respirations unlabored Chest wall: no tenderness or deformity Heart: Regular rate and rhythm Abdomen: Soft, nontender and nondistended Extremities: warm and well-perfused Skin: normal turgor, color and texture Neurologic:  Alert, oriented x 3.  Eyes open spontaneously. PERRL, EOMI, VFC, no facial droop. V1-3 intact.  No dysarthria, tongue protrusion symmetric.  CNII-XII intact. Normal strength, sensation and reflexes throughout.  No pronator drift, full strength in legs. +L SLR.       Data Review  MRI shows severe stenosis at L4-5 with grade 1/2 spondylolisthesis  Assessment:  70 yo F with CAD, s/p renal transplant who has severe L4-5 stenosis with spondylolisthesis  Plan:   - L4-5 TLIF today -  Risks, benefits, alternatives, and expected convalescence was discussed.  Risks discussed included but were not limited to bleeding, pain, infection, scar, recurrence, cerebrospinal fluid leak, neurologic deficit, pseudoarthrosis, adjacent segment disease, coma and death.  I also discussed with her the possibility of blood transfusion during surgery and consent to transfuse blood products if necessary was also obtained.  All questions and concerns were answered and understanding and agreement with the plan  was verbalized.

## 2020-12-01 NOTE — Op Note (Signed)
Procedure(s): Transforaminal Lumbar Interbody Fusion Lumbar four--Lumbar five Procedure Note  Megan Bradford female 70 y.o. 12/01/2020  Procedure(s) and Anesthesia Type:    * Transiforaminal Lumbar Interbody Fusion Lumbar four--Lumbar five - General  Surgeon(s) and Role:    Marcello Moores, Dorcas Carrow, MD - Primary    * Zada Finders Joyice Faster, MD - Assisting   Indications: This is a 70 year old woman with a history of renal transplant and CAD who presented to clinic with worsening back pain and bilateral leg pain, worse on the left side.  She was found to have severe lumbar stenosis and grade 2 spondylolisthesis at L4-5.  Given the progression of her symptoms and failure of nonsurgical therapies, I discussed with her the option of surgery with decompression and fusion.  The general technique of surgery was discussed.  Risk, benefits, alternatives, and expected convalescence were discussed with her.  Risk discussed included, but were not limited to, bleeding, pain, infection, scar, pseudoarthrosis, adjacent segment disease, recurrent stenosis, neurologic deficit, spinal fluid leak, damage to nearby organs, and death.  She wished to proceed with surgery.  Informed consent was obtained.     Surgeon: Vallarie Mare   Assistants: Deatra Ina, MD.  Please note there were no qualified trainees available to assist with the procedure.  Anesthesia: General endotracheal anesthesia  Procedure Detail  Transforaminal lumbar interbody fusion via the left side at L4-5 including laminectomy and bilateral facetectomies and foraminotomies Posterolateral arthrodesis L4-5 Nonsegmental instrumentation with pedicle screws and rods at L4-5 Harvest of local autograft Use of morselized allograft and osteoporotic material  The patient was brought to the operating.  After appropriate lines and monitors were placed, general anesthesia was induced and patient was intubated by the anesthesia service.  She was placed  prone on a Wilson frame with all pressure points padded and eyes protected.  Her lower back was preprepped with alcohol and prepped and draped in sterile fashion.  Timeout was performed.  Preoperative antibiotics and dexamethasone were administered.  1% lidocaine with epinephrine was injected in the planned incision.  Incision was made in the midline of her back with a 10 blade and monopolar electrocautery was used to incise the subcutaneous tissue, the fascia and dissect the paraspinous muscles off of the L4 and L5 lamina in subperiosteal fashion.  Self-retaining retractors were used.  C-arm x-ray was used to confirm localization.  The transverse processes of L4 and L5 were exposed bilaterally.  Laminectomy with removal of the superior portion of L5 and the inferior portion of the L4 lamina was performed with combination of rongeurs and high-speed drill, with autograft harvested.  Inferior articulating processes were then removed bilaterally.  Hypertrophied ligamentum flavum as well as subarticular degenerative material was dissected from the thecal sac and nerve roots and excised.  Foraminotomy was performed on the right side.  The superior articulating process was fully removed on the left side to fully open the foramen.  The thecal sac and traversing nerve roots and exiting nerve roots were well decompressed.  The foramen on the left side was then dissected, with epidural veins coagulated and cut.  The exiting nerve root was identified and protected.  The disc space was incised and a aggressive discectomy was performed with combination of curettes, disc shavers, and rongeurs.  The endplates were prepared with sharp curettes.  C-arm x-ray was used to select an appropriately sized implant.  An expandable 9 to 16 mm cage was selected.  The interbody space was then filled with BMP followed  by autograft mixed with DBF.  The cage was then tamped in place under C-arm guidance.  With the cage placement, there was good  reduction of her spondylolisthesis.  Cage was then expanded under C-arm guidance until it was snug.  There was further reduction in her spondylolisthesis with expansion of the cage.  The cage was then backfilled with autograft and the inserter was removed.  Using anatomic landmarks, pilot holes were drilled for entry points for L4 and L5 bilateral pedicle screws at the junction of the transverse process and the superior articulating process and mamillary process.  Lanky probe was used to cannulate the pedicles under C-arm guidance and ball ended feeler confirmed good cannulation the pedicles.  The screw holes were then tapped and then palpated to confirm bony channels surrounding the screw trajectory.  6.5 x 45 mm  pedicle screws were then placed bilaterally at L4 and L5 with good purchase .  35 mm rods were then placed in the tulip heads.  The rods were then tightened to the inferior screws and then reduction tower was used to perform further reduction of her spondylolisthesis.  The screw caps were final tightened into place.  X-ray confirmed excellent and essentially complete reduction of her spondylolisthesis.  The wound was then irrigated thoroughly.  The transverse processes and remaining facet was decorticated.  The lateral gutters were filled with remaining autograft and DBF.  10 flat JP drain was placed in the subfascial space and tunneled out the skin and secured with stitch.  The muscle and subcutaneous tissues were then injected with Exparel mixed with Marcaine.  The muscle layer was closed with 0 Vicryl stitches.  Fascia was closed with 0 Vicryl stitches.  The subcutaneous fat layer was closed with 0 Vicryl stitches.  Dermal layer was closed with 2-0 Vicryl stitches.  The skin was closed with 4-0 Monocryl in subcuticular manner followed by Dermabond.  A sterile dressing was placed.  Patient was then flipped supine and extubated by the anesthesia service.  All counts were correct at the end of surgery.   No complications were noted.  findings: Successful decompression of severe stenosis and reduction of her grade 2 spondylolisthesis  Estimated Blood Loss: 400 mL         Drains: JACKSON-PRATT (JP)         Total IV Fluids: See anesthesia records  Blood Given: none          Specimens: None         Implants:  Medtronic 6.5 x 45 mm pedicle screws at L4 and L5 bilaterally.   35 mm rods x2 and screw caps x4 Expandable 9 to 16 mm Catalyft intebody cage Xxs BMP and DBF.        Complications:  * No complications entered in OR log *         Disposition: PACU - hemodynamically stable.         Condition: stable

## 2020-12-01 NOTE — Progress Notes (Signed)
Orthopedic Tech Progress Note Patient Details:  Megan Bradford Oct 09, 1950 [id number] LSO brace was dropped of in patient's room.  Ortho Devices Type of Ortho Device: Lumbar corsett Ortho Device/Splint Location: Lower Back Ortho Device/Splint Interventions: Ordered      Megan Bradford 12/01/2020, 6:15 PM

## 2020-12-01 NOTE — Progress Notes (Addendum)
Pharmacy Antibiotic Note  Megan Bradford is a 70 y.o. female admitted on 12/01/2020 for spinal surgery. Pharmacy consulted to dose prophylactic vancomycin for spinal surgery with a duration of 48 hours. Adjusted to daily dosing based on renal function. Received pre-op dose 7/20, will give dose again tonight (7/20) at 2000.  Plan: Vancomycin 1 g IV every 24 hours.  Goal trough 10-15 mcg/mL.  Height: 5' (152.4 cm) Weight: 92.1 kg (203 lb) IBW/kg (Calculated) : 45.5  Temp (24hrs), Avg:97.8 F (36.6 C), Min:97.2 F (36.2 C), Max:98.4 F (36.9 C)  Recent Labs  Lab 11/29/20 0906  WBC 7.6  CREATININE 1.72*    Estimated Creatinine Clearance: 31.7 mL/min (A) (by C-G formula based on SCr of 1.72 mg/dL (H)).    Allergies  Allergen Reactions   Keflex [Cephalexin] Anaphylaxis   Ibuprofen     Raises blood pressure   Xanax [Alprazolam] Other (See Comments)    Per patient it does not make me "violent"    Antimicrobials this admission: Vancomycin 1g 7/20 >>   Dose adjustments this admission: none  Thank you for allowing pharmacy to participate in this patient's care.  Reatha Harps, PharmD PGY1 Pharmacy Resident 12/01/2020 5:15 PM Check [email or web address] for unit specific pharmacy number

## 2020-12-01 NOTE — Anesthesia Procedure Notes (Signed)
Procedure Name: Intubation Date/Time: 12/01/2020 8:52 AM Performed by: Claris Che, CRNA Pre-anesthesia Checklist: Patient identified, Emergency Drugs available, Suction available, Patient being monitored and Timeout performed Patient Re-evaluated:Patient Re-evaluated prior to induction Oxygen Delivery Method: Circle system utilized Preoxygenation: Pre-oxygenation with 100% oxygen Induction Type: IV induction and Cricoid Pressure applied Ventilation: Mask ventilation without difficulty Laryngoscope Size: Mac and 3 Grade View: Grade II Tube type: Oral Tube size: 7.5 mm Number of attempts: 1 Airway Equipment and Method: Stylet Placement Confirmation: ETT inserted through vocal cords under direct vision, positive ETCO2 and breath sounds checked- equal and bilateral Secured at: 22 cm Tube secured with: Tape Dental Injury: Teeth and Oropharynx as per pre-operative assessment

## 2020-12-02 LAB — BASIC METABOLIC PANEL
Anion gap: 9 (ref 5–15)
BUN: 22 mg/dL (ref 8–23)
CO2: 23 mmol/L (ref 22–32)
Calcium: 8.8 mg/dL — ABNORMAL LOW (ref 8.9–10.3)
Chloride: 104 mmol/L (ref 98–111)
Creatinine, Ser: 1.79 mg/dL — ABNORMAL HIGH (ref 0.44–1.00)
GFR, Estimated: 31 mL/min — ABNORMAL LOW (ref >60)
Glucose, Bld: 231 mg/dL — ABNORMAL HIGH (ref 70–99)
Potassium: 4.5 mmol/L (ref 3.5–5.1)
Sodium: 136 mmol/L (ref 135–145)

## 2020-12-02 MED ORDER — CYCLOBENZAPRINE HCL 10 MG PO TABS: 10.0000 mg | ORAL_TABLET | Freq: Three times a day (TID) | ORAL | 0 refills | Status: DC | PRN

## 2020-12-02 MED ORDER — HYDROCODONE-ACETAMINOPHEN 10-325 MG PO TABS: 1.0000 | ORAL_TABLET | Freq: Four times a day (QID) | ORAL | 0 refills | Status: DC | PRN

## 2020-12-02 MED ORDER — FLUCONAZOLE 150 MG PO TABS
150.0000 mg | ORAL_TABLET | Freq: Once | ORAL | Status: AC
  Administered 2020-12-02: 150 mg via ORAL
  Filled 2020-12-02: qty 1

## 2020-12-02 MED ORDER — ALUM & MAG HYDROXIDE-SIMETH 200-200-20 MG/5ML PO SUSP
30.0000 mL | Freq: Four times a day (QID) | ORAL | Status: DC | PRN
  Administered 2020-12-02: 30 mL via ORAL
  Filled 2020-12-02: qty 30

## 2020-12-02 MED ORDER — HYDROCODONE-ACETAMINOPHEN 10-325 MG PO TABS
1.0000 | ORAL_TABLET | Freq: Four times a day (QID) | ORAL | Status: DC | PRN
  Administered 2020-12-02: 1 via ORAL
  Filled 2020-12-02: qty 1

## 2020-12-02 MED ORDER — DOCUSATE SODIUM 100 MG PO CAPS: 100.0000 mg | ORAL_CAPSULE | Freq: Two times a day (BID) | ORAL | 2 refills | Status: DC

## 2020-12-02 NOTE — Evaluation (Signed)
Occupational Therapy Evaluation Patient Details Name: Megan Bradford MRN: [id number] DOB: 02-Feb-1951 Today's Date: 12/02/2020    History of Present Illness Patient is a 70 year old female s/p fusion L4-L5. PMH includes cardiac cath, kidney transplant, prediabetes   Clinical Impression   Patient educated in spine precautions prior to ADLs and how to maintain during self care tasks. Patient return demonstrates figure 4 method for lower body dressing, is able to transfer on/off toilet, perform oral care and upper body/lower body dressing without any physical assistance. Provided hand out of spine precautions as well for patient to bring home. No further acute OT needs, will sign off. Please re-consult if new needs arise.    Follow Up Recommendations  No OT follow up    Equipment Recommendations  None recommended by OT       Precautions / Restrictions Precautions Precautions: Back Precaution Booklet Issued: Yes (comment) Required Braces or Orthoses: Spinal Brace Spinal Brace: Lumbar corset;Applied in sitting position Restrictions Weight Bearing Restrictions: No      Mobility Bed Mobility Overal bed mobility: Modified Independent             General bed mobility comments: HOB elevated, educated in log roll technique and able to demonstrate    Transfers Overall transfer level: Independent Equipment used: None                  Balance Overall balance assessment: No apparent balance deficits (not formally assessed)                                         ADL either performed or assessed with clinical judgement   ADL Overall ADL's : Independent                                       General ADL Comments: patient is able to perform toilet transfer, upper body and lower body dressing using figure 4 method and oral care without any physical assistance. prior to ADLs educated patient on spine precautions and how to maintain during self  care tasks      Pertinent Vitals/Pain Pain Assessment: Faces Faces Pain Scale: Hurts little more Pain Location: back Pain Descriptors / Indicators: Sore Pain Intervention(s): Monitored during session     Hand Dominance Right   Extremity/Trunk Assessment Upper Extremity Assessment Upper Extremity Assessment: Overall WFL for tasks assessed   Lower Extremity Assessment Lower Extremity Assessment: Defer to PT evaluation   Cervical / Trunk Assessment Cervical / Trunk Assessment: Normal   Communication Communication Communication: No difficulties   Cognition Arousal/Alertness: Awake/alert Behavior During Therapy: WFL for tasks assessed/performed Overall Cognitive Status: Within Functional Limits for tasks assessed                                                Home Living Family/patient expects to be discharged to:: Private residence Living Arrangements: Alone Available Help at Discharge: Family Type of Home: House Home Access: Stairs to enter Technical brewer of Steps: 1   Home Layout: One level     Bathroom Shower/Tub: Teacher, early years/pre: Handicapped height     Home Equipment: Grab bars - tub/shower  Additional Comments: DTR plans to stay with patient for first week      Prior Functioning/Environment Level of Independence: Independent                 OT Problem List: Pain;Obesity         OT Goals(Current goals can be found in the care plan section) Acute Rehab OT Goals Patient Stated Goal: home today OT Goal Formulation: All assessment and education complete, DC therapy   AM-PAC OT "6 Clicks" Daily Activity     Outcome Measure Help from another person eating meals?: None Help from another person taking care of personal grooming?: None Help from another person toileting, which includes using toliet, bedpan, or urinal?: None Help from another person bathing (including washing, rinsing, drying)?: None Help  from another person to put on and taking off regular upper body clothing?: None Help from another person to put on and taking off regular lower body clothing?: None 6 Click Score: 24   End of Session Nurse Communication: Mobility status  Activity Tolerance: Patient tolerated treatment well Patient left: Other (comment);with call bell/phone within reach (seated EOB)  OT Visit Diagnosis: Pain Pain - part of body:  (back)                Time: 7125-2712 OT Time Calculation (min): 26 min Charges:  OT General Charges $OT Visit: 1 Visit OT Evaluation $OT Eval Low Complexity: 1 Low OT Treatments $Self Care/Home Management : 8-22 mins  Delbert Phenix OT OT pager: [phone]  Rosemary Holms 12/02/2020, 8:52 AM

## 2020-12-02 NOTE — Progress Notes (Signed)
Patient alert and oriented, voiding adequately, skin clean, dry and intact without evidence of skin break down, or symptoms of complications - no redness or edema noted, only slight tenderness at site.  Patient states pain is manageable at time of discharge. Patient has an appointment with MD in 2 weeks 

## 2020-12-02 NOTE — Discharge Summary (Signed)
Physician Discharge Summary  Patient ID: Megan Bradford MRN: [id number] DOB/AGE: 69-12-1950 69 y.o.  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 female with a history of renal transplant, CAD and HTN who developed progressive severe back and leg pain, L>R.  Over the past year, it progressed despite nonsurgical therapies.  She underwent elective L4-5 TLIF and postoperatively was admitted to the spine unit.  There, she was mobilized with PT/OT.  She was tolerating a diet, ambulating well, voiding, and her pain was well-controlled with PO pain medications.  She was deemed ready for discharge home on 7/21.  Her JP drain was removed prior to discharge.  Treatments: Surgery - L4-5 TLIF  Discharge Exam: Blood pressure 112/74, pulse 69, temperature 97.8 F (36.6 C), resp. rate 18, height 5' (1.524 m), weight 92.1 kg, SpO2 100 %. Awake, alert, oriented Speech fluent, appropriate CN grossly intact 5/5 BUE/BLE Wound c/d/i  Disposition: Discharge disposition: 01-Home or Self Care       Discharge Instructions     Incentive spirometry RT   Complete by: As directed       Allergies as of 12/02/2020       Reactions   Keflex [cephalexin] Anaphylaxis   Ibuprofen    Raises blood pressure   Xanax [alprazolam] Other (See Comments)   Per patient it does not make me "violent"        Medication List     STOP taking these medications    HYDROcodone-acetaminophen 5-325 MG tablet Commonly known as: Norco Replaced by: HYDROcodone-acetaminophen 10-325 MG tablet       TAKE these medications    acetaminophen 650 MG CR tablet Commonly known as: TYLENOL Take 650 mg by mouth every 8 (eight) hours as needed for pain.   amLODipine 5 MG tablet Commonly  known as: NORVASC Take 5 mg by mouth daily.   carvedilol 12.5 MG tablet Commonly known as: COREG Take 12.5 mg by mouth 2 (two) times daily with a meal.   CO Q10 PO Take 1 capsule by mouth daily.   cyclobenzaprine 10 MG tablet Commonly known as: FLEXERIL Take 1 tablet (10 mg total) by mouth 3 (three) times daily as needed for muscle spasms.   docusate sodium 100 MG capsule Commonly known as: COLACE Take 1 capsule (100 mg total) by mouth 2 (two) times daily.   HYDROcodone-acetaminophen 10-325 MG tablet Commonly known as: NORCO Take 1 tablet by mouth every 6 (six) hours as needed for moderate pain or severe pain. Replaces: HYDROcodone-acetaminophen 5-325 MG tablet   magnesium oxide 400 MG tablet Commonly known as: MAG-OX Take 400 mg by mouth daily.   multivitamin-lutein Caps capsule Take 1 capsule by mouth daily.   mycophenolate 180 MG EC tablet Commonly known as: MYFORTIC Take 180 mg by mouth 2 (two) times daily.   Neuriva Chew Chew 2 tablets by mouth daily.   ondansetron 4 MG tablet Commonly known as: Zofran Take 1 tablet (4 mg total) by mouth every 8 (eight) hours as needed for nausea or vomiting.   Salonpas Pain Relief Patch Ptch Place 1 patch onto the skin daily as needed (back pain).   VITAMIN B 12 PO Take 1 tablet by mouth daily.   Vitamin D3 10 MCG (400 UNIT) Caps Take 400 Units by mouth daily.  ASK your doctor about these medications    aspirin EC 81 MG tablet Take 81 mg by mouth daily. Swallow whole.   diclofenac Sodium 1 % Gel Commonly known as: VOLTAREN Apply 2 g topically 4 (four) times daily.   rosuvastatin 20 MG tablet Commonly known as: CRESTOR Take 1 tablet (20 mg total) by mouth daily.   tacrolimus 1 MG capsule Commonly known as: PROGRAF Take 4 capsules by mouth in the morning.  Take 2 capsules by mouth in the evening.        Follow-up Information     Vallarie Mare, MD Follow up in 2 week(s).   Specialty:  Neurosurgery Contact information: [address] [address] [phone]                 Signed: Vallarie Mare 12/02/2020, 1:28 PM

## 2020-12-02 NOTE — Anesthesia Postprocedure Evaluation (Signed)
Anesthesia Post Note  Patient: Megan Bradford  Procedure(s) Performed: Transiforaminal Lumbar Interbody Fusion Lumbar four--Lumbar five     Patient location during evaluation: PACU Anesthesia Type: General Level of consciousness: awake and alert Pain management: pain level controlled Vital Signs Assessment: post-procedure vital signs reviewed and stable Respiratory status: spontaneous breathing, nonlabored ventilation, respiratory function stable and patient connected to nasal cannula oxygen Cardiovascular status: blood pressure returned to baseline and stable Postop Assessment: no apparent nausea or vomiting Anesthetic complications: no   No notable events documented.  Last Vitals:  Vitals:   12/01/20 2301 12/02/20 0352  BP: (!) 151/92 129/83  Pulse: 88 65  Resp: 18 20  Temp: 36.6 C 36.8 C  SpO2: 99% 99%    Last Pain:  Vitals:   12/02/20 0602  TempSrc:   PainSc: Asleep                 Megan Bradford

## 2020-12-02 NOTE — Plan of Care (Signed)
Adequately ready for Discharge

## 2020-12-02 NOTE — Discharge Instructions (Signed)
Wound Care Leave incision open to air. You may shower. Do not scrub directly on incision.  Do not put any creams, lotions, or ointments on incision. Activity Walk each and every day, increasing distance each day. No lifting greater than 5 lbs.  Avoid bending, arching, and twisting. No driving for 2 weeks; may ride as a passenger locally. If provided with back brace, wear when out of bed.  It is not necessary to wear in bed. Diet Resume your normal diet.  Return to Work Will be discussed at you follow up appointment. Call Your Doctor If Any of These Occur Redness, drainage, or swelling at the wound.  Temperature greater than 101 degrees. Severe pain not relieved by pain medication. Incision starts to come apart. Follow Up Appt Call today for appointment in 2-3 weeks ([phone]) or for problems.  If you have any hardware placed in your spine, you will need an x-ray before your appointment. 

## 2020-12-02 NOTE — Evaluation (Signed)
Physical Therapy Evaluation and Discharge Patient Details Name: Megan Bradford MRN: [id number] DOB: Jun 14, 1950 Today's Date: 12/02/2020   History of Present Illness  Patient is a 70 year old female s/p fusion L4-L5. PMH includes cardiac cath, kidney transplant, prediabetes  Clinical Impression  Patient evaluated by Physical Therapy with no further acute PT needs identified. All education has been completed and the patient has no further questions. Pt was able to demonstrate transfers and ambulation with gross supervision for safety and no AD. Pt was educated on precautions, brace application/wearing schedule, appropriate activity progression, and car transfer. See below for any follow-up Physical Therapy or equipment needs. PT is signing off. Thank you for this referral.     Follow Up Recommendations No PT follow up;Supervision for mobility/OOB    Equipment Recommendations  None recommended by PT    Recommendations for Other Services       Precautions / Restrictions Precautions Precautions: Back Precaution Booklet Issued: Yes (comment) Precaution Comments: Reviewed handout and pt was cued for precautions during functional mobility. Required Braces or Orthoses: Spinal Brace Spinal Brace: Lumbar corset;Applied in sitting position Restrictions Weight Bearing Restrictions: No      Mobility  Bed Mobility Overal bed mobility: Needs Assistance Bed Mobility: Rolling;Sidelying to Sit;Sit to Sidelying Rolling: Supervision Sidelying to sit: Supervision     Sit to sidelying: Supervision General bed mobility comments: Poor maintenance of precautions. Required cues for proper log roll technique.    Transfers Overall transfer level: Needs assistance Equipment used: None Transfers: Sit to/from Stand Sit to Stand: Supervision         General transfer comment: VC's for hand placement on seated surface for safety. No assist required.  Ambulation/Gait Ambulation/Gait assistance:  Supervision Gait Distance (Feet): 300 Feet Assistive device: None Gait Pattern/deviations: Step-through pattern;Decreased stride length;Trunk flexed Gait velocity: Decreased Gait velocity interpretation: <1.31 ft/sec, indicative of household ambulator General Gait Details: VC's for improved posture and forward gaze. Overall pt was ambulating slow and guarded but without overt LOB noted.  Stairs            Wheelchair Mobility    Modified Rankin (Stroke Patients Only)       Balance Overall balance assessment: No apparent balance deficits (not formally assessed)                                           Pertinent Vitals/Pain Pain Assessment: Faces Faces Pain Scale: Hurts little more Pain Location: back Pain Descriptors / Indicators: Sore Pain Intervention(s): Limited activity within patient's tolerance;Monitored during session;Repositioned    Home Living Family/patient expects to be discharged to:: Private residence Living Arrangements: Alone Available Help at Discharge: Family Type of Home: House Home Access: Stairs to enter   Technical brewer of Steps: 1 Home Layout: One level Home Equipment: Grab bars - tub/shower Additional Comments: Daughter plans to stay with patient for first week    Prior Function Level of Independence: Independent               Hand Dominance   Dominant Hand: Right    Extremity/Trunk Assessment   Upper Extremity Assessment Upper Extremity Assessment: Defer to OT evaluation    Lower Extremity Assessment Lower Extremity Assessment: Generalized weakness (Decreased strength and muscular endurance consistent with pre-op diagnosis, however mild.)    Cervical / Trunk Assessment Cervical / Trunk Assessment: Other exceptions Cervical / Trunk Exceptions: s/; surgery  Communication   Communication: No difficulties  Cognition Arousal/Alertness: Awake/alert Behavior During Therapy: WFL for tasks  assessed/performed Overall Cognitive Status: Within Functional Limits for tasks assessed                                        General Comments      Exercises     Assessment/Plan    PT Assessment Patent does not need any further PT services  PT Problem List         PT Treatment Interventions      PT Goals (Current goals can be found in the Care Plan section)  Acute Rehab PT Goals Patient Stated Goal: home today PT Goal Formulation: All assessment and education complete, DC therapy    Frequency     Barriers to discharge        Co-evaluation               AM-PAC PT "6 Clicks" Mobility  Outcome Measure Help needed turning from your back to your side while in a flat bed without using bedrails?: None Help needed moving from lying on your back to sitting on the side of a flat bed without using bedrails?: A Little Help needed moving to and from a bed to a chair (including a wheelchair)?: A Little Help needed standing up from a chair using your arms (e.g., wheelchair or bedside chair)?: A Little Help needed to walk in hospital room?: A Little Help needed climbing 3-5 steps with a railing? : A Little 6 Click Score: 19    End of Session Equipment Utilized During Treatment: Back brace Activity Tolerance: Patient tolerated treatment well Patient left: in bed;with call bell/phone within reach Nurse Communication: Mobility status PT Visit Diagnosis: Unsteadiness on feet (R26.81);Pain Pain - part of body:  (incision site)    Time: 2353-6144 PT Time Calculation (min) (ACUTE ONLY): 16 min   Charges:   PT Evaluation $PT Eval Low Complexity: 1 Low          Rolinda Roan, PT, DPT Acute Rehabilitation Services Pager: [phone] Office: [phone]   Thelma Comp 12/02/2020, 1:45 PM

## 2020-12-03 ENCOUNTER — Encounter (HOSPITAL_COMMUNITY): Payer: Self-pay | Admitting: Neurosurgery

## 2020-12-09 ENCOUNTER — Encounter: Payer: Medicare Other | Admitting: Orthopaedic Surgery

## 2020-12-20 ENCOUNTER — Other Ambulatory Visit: Payer: Self-pay | Admitting: *Deleted

## 2020-12-20 MED ORDER — ROSUVASTATIN CALCIUM 20 MG PO TABS: 20.0000 mg | ORAL_TABLET | Freq: Every day | ORAL | 3 refills | Status: DC

## 2020-12-21 ENCOUNTER — Encounter: Payer: Medicare Other | Admitting: Gastroenterology

## 2021-02-03 ENCOUNTER — Encounter: Payer: Self-pay | Admitting: Gastroenterology

## 2021-02-28 ENCOUNTER — Ambulatory Visit: Payer: Medicare Other | Admitting: Emergency Medicine

## 2021-03-01 ENCOUNTER — Ambulatory Visit: Payer: Medicare Other | Admitting: Emergency Medicine

## 2021-03-15 ENCOUNTER — Encounter: Payer: Self-pay | Admitting: Emergency Medicine

## 2021-03-15 ENCOUNTER — Ambulatory Visit (INDEPENDENT_AMBULATORY_CARE_PROVIDER_SITE_OTHER): Payer: Medicare Other | Admitting: Emergency Medicine

## 2021-03-15 ENCOUNTER — Other Ambulatory Visit: Payer: Self-pay

## 2021-03-15 VITALS — BP 120/68 | HR 80 | Ht 60.0 in | Wt 205.0 lb

## 2021-03-15 DIAGNOSIS — N1832 Chronic kidney disease, stage 3b: Secondary | ICD-10-CM

## 2021-03-15 DIAGNOSIS — I10 Essential (primary) hypertension: Secondary | ICD-10-CM

## 2021-03-15 DIAGNOSIS — Z940 Kidney transplant status: Secondary | ICD-10-CM | POA: Diagnosis not present

## 2021-03-15 DIAGNOSIS — R7303 Prediabetes: Secondary | ICD-10-CM

## 2021-03-15 DIAGNOSIS — D849 Immunodeficiency, unspecified: Secondary | ICD-10-CM

## 2021-03-15 DIAGNOSIS — Z23 Encounter for immunization: Secondary | ICD-10-CM | POA: Diagnosis not present

## 2021-03-15 DIAGNOSIS — E785 Hyperlipidemia, unspecified: Secondary | ICD-10-CM

## 2021-03-15 NOTE — Patient Instructions (Signed)

## 2021-03-15 NOTE — Progress Notes (Signed)
Megan Bradford 70 y.o.   Chief Complaint  Patient presents with   Hypertension    6 month F/U    HISTORY OF PRESENT ILLNESS: This is a 70 y.o. female kidney transplant patient here for 82-month follow-up of hypertension. Doing well.  Has no complaints or medical concerns today. Compliant with medications. Sees nephrologist on a regular basis.  BP Readings from Last 3 Encounters:  03/15/21 120/68  12/02/20 113/68  11/29/20 (!) 147/94     HPI   Prior to Admission medications   Medication Sig Start Date End Date Taking? Authorizing Provider  acetaminophen (TYLENOL) 650 MG CR tablet Take 650 mg by mouth every 8 (eight) hours as needed for pain.   Yes [provider]  amLODipine (NORVASC) 5 MG tablet Take 5 mg by mouth daily.   Yes [provider]  carvedilol (COREG) 12.5 MG tablet Take 12.5 mg by mouth 2 (two) times daily with a meal.   Yes [provider]  Cholecalciferol (VITAMIN D3) 10 MCG (400 UNIT) CAPS Take 400 Units by mouth daily.   Yes [provider]  Coenzyme Q10 (CO Q10 PO) Take 1 capsule by mouth daily.   Yes [provider]  Cyanocobalamin (VITAMIN B 12 PO) Take 1 tablet by mouth daily.   Yes [provider]  cyclobenzaprine (FLEXERIL) 10 MG tablet Take 1 tablet (10 mg total) by mouth 3 (three) times daily as needed for muscle spasms. 12/02/20  Yes Vallarie Mare, MD  docusate sodium (COLACE) 100 MG capsule Take 1 capsule (100 mg total) by mouth 2 (two) times daily. 12/02/20  Yes Vallarie Mare, MD  HYDROcodone-acetaminophen (NORCO) 10-325 MG tablet Take 1 tablet by mouth every 6 (six) hours as needed for moderate pain or severe pain. 12/02/20  Yes Vallarie Mare, MD  magnesium oxide (MAG-OX) 400 MG tablet Take 400 mg by mouth daily.   Yes [provider]  Menthol-Methyl Salicylate (SALONPAS PAIN RELIEF PATCH) [place] 1 patch onto the skin daily as needed (back pain).   Yes [provider]  Misc Natural Products (NEURIVA) CHEW Chew 2 tablets by mouth daily.   Yes [provider]  multivitamin-lutein (OCUVITE-LUTEIN) CAPS capsule Take 1 capsule by mouth daily.   Yes [provider]  mycophenolate (MYFORTIC) 180 MG EC tablet Take 180 mg by mouth 2 (two) times daily.   Yes [provider]  ondansetron (ZOFRAN) 4 MG tablet Take 1 tablet (4 mg total) by mouth every 8 (eight) hours as needed for nausea or vomiting. 10/24/20  Yes [name], [name]  rosuvastatin (CRESTOR) 20 MG tablet Take 1 tablet (20 mg total) by mouth daily. 12/20/20  Yes [name], MD  tacrolimus (PROGRAF) 1 MG capsule Take 4 capsules by mouth in the morning.  Take 2 capsules by mouth in the evening. Patient taking differently: Take 2-4 mg by mouth See admin instructions. Take 4 mg by mouth in the morning and 2 mg in the evening. 08/30/20  Yes Horald Pollen, MD  aspirin EC 81 MG tablet Take 81 mg by mouth daily. Swallow whole. Patient not taking: No sig reported    [provider]  diclofenac Sodium (VOLTAREN) 1 % GEL Apply 2 g topically 4 (four) times daily. Patient not taking: No sig reported 12/01/19   Wieters, Hallie C, [name]    Allergies  Allergen Reactions   Keflex [Cephalexin] Anaphylaxis   Ibuprofen     Raises blood pressure  Xanax [Alprazolam] Other (See Comments)    Per patient it does not make me "violent"    Patient Active Problem List   Diagnosis Date Noted   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/01/2020   Trigger finger, right middle finger 10/28/2020   Coronary artery disease of native artery of native heart with stable angina pectoris ([place]) 08/30/2020   Immunosuppression ([place]) 08/30/2020   Stage 3b chronic kidney disease ([place]) 12/17/2019   History of kidney transplant 11/27/2019   Essential hypertension 11/27/2019   Dyslipidemia 11/27/2019   Prediabetes 11/27/2019   Body mass index (BMI) of 38.0-38.9 in adult 11/27/2019    Past  Medical History:  Diagnosis Date   Cataract    Phreesia 11/24/2019   Chronic kidney disease    [employer]reesia 11/24/2019   Heart murmur    Hypertension    Phreesia 11/24/2019   Pulmonary embolism ([place]) 2010    Past Surgical History:  Procedure Laterality Date   CARDIAC CATHETERIZATION     CESAREAN SECTION N/A    [employer]reesia 11/24/2019   CHOLECYSTECTOMY N/A    [employer]reesia 11/24/2019   COLONOSCOPY     DILATION AND CURETTAGE OF UTERUS     EYE SURGERY N/A    Phreesia 11/24/2019   HIP SURGERY Left 1990   Hip dysplasia cleaned out per patient   TONSILLECTOMY     TRANSFORAMINAL LUMBAR INTERBODY FUSION (TLIF) WITH PEDICLE SCREW FIXATION 1 LEVEL N/A 12/01/2020   Procedure: Transiforaminal Lumbar Interbody Fusion Lumbar four--Lumbar five;  Surgeon: Vallarie Mare, MD;  Location: [facility];  Service: Neurosurgery;  Laterality: N/A;   TRIGGER FINGER RELEASE Right 10/28/2020   Long finger    Social History   Socioeconomic History   Marital status: Widowed    Spouse name: Not on file   Number of children: Not on file   Years of education: Not on file   Highest education level: Not on file  Occupational History   Not on file  Tobacco Use   Smoking status: Never   Smokeless tobacco: Never  Vaping Use   Vaping Use: Never used  Substance and Sexual Activity   Alcohol use: Never   Drug use: Never   Sexual activity: Not Currently    Birth control/protection: None  Other Topics Concern   Not on file  Social History Narrative   Not on file   Social Determinants of Health   Financial Resource Strain: Not on file  Food Insecurity: Not on file  Transportation Needs: Not on file  Physical Activity: Not on file  Stress: Not on file  Social Connections: Not on file  Intimate Partner Violence: Not on file    Family History  Problem Relation Age of Onset   Diabetes Mother    Asthma Mother    Hypertension Father    Coronary artery disease Father      Review of Systems   Constitutional: Negative.  Negative for chills and fever.  HENT: Negative.  Negative for congestion and sore throat.   Respiratory: Negative.  Negative for cough and shortness of breath.   Cardiovascular: Negative.  Negative for chest pain and palpitations.  Gastrointestinal: Negative.  Negative for abdominal pain, diarrhea, nausea and vomiting.  Genitourinary: Negative.  Negative for dysuria and hematuria.  Skin: Negative.  Negative for rash.  Neurological: Negative.  Negative for dizziness and headaches.  All other systems reviewed and are negative.  Today's Vitals   03/15/21 0902  BP: 120/68  Pulse: 80  SpO2: 96%  Weight: 205  lb (93 kg)  Height: 5' (1.524 m)   Body mass index is 40.04 kg/m. Wt Readings from Last 3 Encounters:  03/15/21 205 lb (93 kg)  12/01/20 203 lb (92.1 kg)  11/29/20 203 lb 8 oz (92.3 kg)    Physical Exam Vitals reviewed.  Constitutional:      Appearance: Normal appearance.  HENT:     Head: Normocephalic.  Eyes:     Extraocular Movements: Extraocular movements intact.     Pupils: Pupils are equal, round, and reactive to light.  Cardiovascular:     Rate and Rhythm: Normal rate and regular rhythm.     Pulses: Normal pulses.     Heart sounds: Normal heart sounds.  Pulmonary:     Effort: Pulmonary effort is normal.     Breath sounds: Normal breath sounds.  Musculoskeletal:     Right lower leg: No edema.     Left lower leg: No edema.  Skin:    General: Skin is warm and dry.     Capillary Refill: Capillary refill takes less than 2 seconds.  Neurological:     General: No focal deficit present.     Mental Status: She is alert and oriented to person, place, and time.  Psychiatric:        Mood and Affect: Mood normal.        Behavior: Behavior normal.     ASSESSMENT & PLAN: Problem List Items Addressed This Visit       Cardiovascular and Mediastinum   Essential hypertension - Primary    Well-controlled hypertension.  Continue amlodipine 5  mg daily and carvedilol 12.5 mg twice a day. BP Readings from Last 3 Encounters:  03/15/21 120/68  12/02/20 113/68  11/29/20 (!) 147/94           Genitourinary   Stage 3b chronic kidney disease ([place])    Stable Lab Results  Component Value Date   CREATININE 1.79 (H) 12/02/2020   BUN 22 12/02/2020   NA 136 12/02/2020   K 4.5 12/02/2020   CL 104 12/02/2020   CO2 23 12/02/2020           Other   History of kidney transplant   Dyslipidemia   Prediabetes    Stable Lab Results  Component Value Date   HGBA1C 6.4 (H) 11/27/2019         Immunosuppression ([place])   Other Visit Diagnoses     Need for influenza vaccination       Relevant Orders   Flu Vaccine QUAD High Dose(Fluad) (Completed)      Patient Instructions  Hypertension, Adult High blood pressure (hypertension) is when the force of blood pumping through the arteries is too strong. The arteries are the blood vessels that carry blood from the heart throughout the body. Hypertension forces the heart to work harder to pump blood and may cause arteries to become narrow or stiff. Untreated or uncontrolled hypertension can cause a heart attack, heart failure, a stroke, kidney disease, and other problems. A blood pressure reading consists of a higher number over a lower number. Ideally, your blood pressure should be below 120/80. The first ("top") number is called the systolic pressure. It is a measure of the pressure in your arteries as your heart beats. The second ("bottom") number is called the diastolic pressure. It is a measure of the pressure in your arteries as the heart relaxes. What are the causes? The exact cause of this condition is not known. There are some conditions  that result in or are related to high blood pressure. What increases the risk? Some risk factors for high blood pressure are under your control. The following factors may make you more likely to develop this condition: Smoking. Having type 2  diabetes mellitus, high cholesterol, or both. Not getting enough exercise or physical activity. Being overweight. Having too much fat, sugar, calories, or salt (sodium) in your diet. Drinking too much alcohol. Some risk factors for high blood pressure may be difficult or impossible to change. Some of these factors include: Having chronic kidney disease. Having a family history of high blood pressure. Age. Risk increases with age. Race. You may be at higher risk if you are African American. Gender. Men are at higher risk than women before age 55. After age 89, women are at higher risk than men. Having obstructive sleep apnea. Stress. What are the signs or symptoms? High blood pressure may not cause symptoms. Very high blood pressure (hypertensive crisis) may cause: Headache. Anxiety. Shortness of breath. Nosebleed. Nausea and vomiting. Vision changes. Severe chest pain. Seizures. How is this diagnosed? This condition is diagnosed by measuring your blood pressure while you are seated, with your arm resting on a flat surface, your legs uncrossed, and your feet flat on the floor. The cuff of the blood pressure monitor will be placed directly against the skin of your upper arm at the level of your heart. It should be measured at least twice using the same arm. Certain conditions can cause a difference in blood pressure between your right and left arms. Certain factors can cause blood pressure readings to be lower or higher than normal for a short period of time: When your blood pressure is higher when you are in a health care provider's office than when you are at home, this is called white coat hypertension. Most people with this condition do not need medicines. When your blood pressure is higher at home than when you are in a health care provider's office, this is called masked hypertension. Most people with this condition may need medicines to control blood pressure. If you have a high  blood pressure reading during one visit or you have normal blood pressure with other risk factors, you may be asked to: Return on a different day to have your blood pressure checked again. Monitor your blood pressure at home for 1 week or longer. If you are diagnosed with hypertension, you may have other blood or imaging tests to help your health care provider understand your overall risk for other conditions. How is this treated? This condition is treated by making healthy lifestyle changes, such as eating healthy foods, exercising more, and reducing your alcohol intake. Your health care provider may prescribe medicine if lifestyle changes are not enough to get your blood pressure under control, and if: Your systolic blood pressure is above 130. Your diastolic blood pressure is above 80. Your personal target blood pressure may vary depending on your medical conditions, your age, and other factors. Follow these instructions at home: Eating and drinking  Eat a diet that is high in fiber and potassium, and low in sodium, added sugar, and fat. An example eating plan is called the DASH (Dietary Approaches to Stop Hypertension) diet. To eat this way: Eat plenty of fresh fruits and vegetables. Try to fill one half of your plate at each meal with fruits and vegetables. Eat whole grains, such as whole-wheat pasta, brown rice, or whole-grain bread. Fill about one fourth of your plate  with whole grains. Eat or drink low-fat dairy products, such as skim milk or low-fat yogurt. Avoid fatty cuts of meat, processed or cured meats, and poultry with skin. Fill about one fourth of your plate with lean proteins, such as fish, chicken without skin, beans, eggs, or tofu. Avoid pre-made and processed foods. These tend to be higher in sodium, added sugar, and fat. Reduce your daily sodium intake. Most people with hypertension should eat less than 1,500 mg of sodium a day. Do not drink alcohol if: Your health care  provider tells you not to drink. You are pregnant, may be pregnant, or are planning to become pregnant. If you drink alcohol: Limit how much you use to: 0-1 drink a day for women. 0-2 drinks a day for men. Be aware of how much alcohol is in your drink. In the [place] one drink equals one 12 oz bottle of beer (355 mL), one 5 oz glass of wine (148 mL), or one 1 oz glass of hard liquor (44 mL). Lifestyle  Work with your health care provider to maintain a healthy body weight or to lose weight. Ask what an ideal weight is for you. Get at least 30 minutes of exercise most days of the week. Activities may include walking, swimming, or biking. Include exercise to strengthen your muscles (resistance exercise), such as Pilates or lifting weights, as part of your weekly exercise routine. Try to do these types of exercises for 30 minutes at least 3 days a week. Do not use any products that contain nicotine or tobacco, such as cigarettes, e-cigarettes, and chewing tobacco. If you need help quitting, ask your health care provider. Monitor your blood pressure at home as told by your health care provider. Keep all follow-up visits as told by your health care provider. This is important. Medicines Take over-the-counter and prescription medicines only as told by your health care provider. Follow directions carefully. Blood pressure medicines must be taken as prescribed. Do not skip doses of blood pressure medicine. Doing this puts you at risk for problems and can make the medicine less effective. Ask your health care provider about side effects or reactions to medicines that you should watch for. Contact a health care provider if you: Think you are having a reaction to a medicine you are taking. Have headaches that keep coming back (recurring). Feel dizzy. Have swelling in your ankles. Have trouble with your vision. Get help right away if you: Develop a severe headache or confusion. Have unusual weakness or  numbness. Feel faint. Have severe pain in your chest or abdomen. Vomit repeatedly. Have trouble breathing. Summary Hypertension is when the force of blood pumping through your arteries is too strong. If this condition is not controlled, it may put you at risk for serious complications. Your personal target blood pressure may vary depending on your medical conditions, your age, and other factors. For most people, a normal blood pressure is less than 120/80. Hypertension is treated with lifestyle changes, medicines, or a combination of both. Lifestyle changes include losing weight, eating a healthy, low-sodium diet, exercising more, and limiting alcohol. This information is not intended to replace advice given to you by your health care provider. Make sure you discuss any questions you have with your health care provider. Document Revised: 01/09/2018 Document Reviewed: 01/09/2018 Elsevier Patient Education  2022 [place], MD [facility]

## 2021-03-15 NOTE — Assessment & Plan Note (Signed)
Stable Lab Results  Component Value Date   HGBA1C 6.4 (H) 11/27/2019

## 2021-03-15 NOTE — Assessment & Plan Note (Signed)
Stable Lab Results  Component Value Date   CREATININE 1.79 (H) 12/02/2020   BUN 22 12/02/2020   NA 136 12/02/2020   K 4.5 12/02/2020   CL 104 12/02/2020   CO2 23 12/02/2020

## 2021-03-15 NOTE — Assessment & Plan Note (Signed)
Well-controlled hypertension.  Continue amlodipine 5 mg daily and carvedilol 12.5 mg twice a day. BP Readings from Last 3 Encounters:  03/15/21 120/68  12/02/20 113/68  11/29/20 (!) 147/94

## 2021-04-27 ENCOUNTER — Other Ambulatory Visit: Payer: Self-pay

## 2021-04-27 ENCOUNTER — Ambulatory Visit (AMBULATORY_SURGERY_CENTER): Payer: Medicare Other | Admitting: *Deleted

## 2021-04-27 VITALS — Ht 60.0 in | Wt 194.7 lb

## 2021-04-27 DIAGNOSIS — K625 Hemorrhage of anus and rectum: Secondary | ICD-10-CM

## 2021-04-27 DIAGNOSIS — R194 Change in bowel habit: Secondary | ICD-10-CM

## 2021-04-27 NOTE — Progress Notes (Signed)

## 2021-05-11 ENCOUNTER — Ambulatory Visit (AMBULATORY_SURGERY_CENTER): Payer: Medicare Other | Admitting: Gastroenterology

## 2021-05-11 ENCOUNTER — Encounter: Payer: Self-pay | Admitting: Gastroenterology

## 2021-05-11 ENCOUNTER — Other Ambulatory Visit: Payer: Self-pay

## 2021-05-11 VITALS — BP 194/95 | HR 82 | Temp 97.6°F | Resp 19 | Ht 60.0 in | Wt 195.0 lb

## 2021-05-11 DIAGNOSIS — K635 Polyp of colon: Secondary | ICD-10-CM

## 2021-05-11 DIAGNOSIS — D124 Benign neoplasm of descending colon: Secondary | ICD-10-CM

## 2021-05-11 DIAGNOSIS — R194 Change in bowel habit: Secondary | ICD-10-CM

## 2021-05-11 DIAGNOSIS — K5730 Diverticulosis of large intestine without perforation or abscess without bleeding: Secondary | ICD-10-CM | POA: Diagnosis not present

## 2021-05-11 DIAGNOSIS — D122 Benign neoplasm of ascending colon: Secondary | ICD-10-CM

## 2021-05-11 DIAGNOSIS — K625 Hemorrhage of anus and rectum: Secondary | ICD-10-CM

## 2021-05-11 MED ORDER — SODIUM CHLORIDE 0.9 % IV SOLN: 500.0000 mL | Freq: Once | INTRAVENOUS | Status: DC

## 2021-05-11 NOTE — Op Note (Signed)
[place] Patient Name: Megan Bradford Procedure Date: 05/11/2021 7:59 AM MRN: [id number] Endoscopist: Thornton Park MD, MD Age: 70 Referring MD:  Date of Birth: 1951/02/27 Gender: Female Account #: [id number] Procedure:                Colonoscopy Indications:              Surveillance: Personal history of adenomatous                            polyps on last colonoscopy > 3 years ago                           Recent complaints of fecal incontinence                           Last colonoscopy in [facility] 08/09/16: 4 colon                            polyps, path results not known Medicines:                Monitored Anesthesia Care Procedure:                Pre-Anesthesia Assessment:                           - Prior to the procedure, a History and Physical                            was performed, and patient medications and                            allergies were reviewed. The patient's tolerance of                            previous anesthesia was also reviewed. The risks                            and benefits of the procedure and the sedation                            options and risks were discussed with the patient.                            All questions were answered, and informed consent                            was obtained. Prior Anticoagulants: The patient has                            taken no previous anticoagulant or antiplatelet                            agents. ASA Grade Assessment: II - A patient with  mild systemic disease. After reviewing the risks                            and benefits, the patient was deemed in                            satisfactory condition to undergo the procedure.                           After obtaining informed consent, the colonoscope                            was passed under direct vision. Throughout the                            procedure, the patient's blood pressure, pulse, and                             oxygen saturations were monitored continuously. The                            Colonoscope was introduced through the anus and                            advanced to the 3 cm into the ileum. A second                            forward view of the right colon was performed. The                            colonoscopy was performed without difficulty. The                            patient tolerated the procedure well. The quality                            of the bowel preparation was good. The terminal                            ileum, ileocecal valve, appendiceal orifice, and                            rectum were photographed. Scope In: 8:11:41 AM Scope Out: 8:27:03 AM Scope Withdrawal Time: 0 hours 12 minutes 49 seconds  Total Procedure Duration: 0 hours 15 minutes 22 seconds  Findings:                 The perianal and digital rectal examinations were                            normal.                           Multiple small and large-mouthed diverticula were  found in the sigmoid colon.                           Two sessile polyps were found in the descending                            colon. The polyps were 2 to 3 mm in size. These                            polyps were removed with a cold snare. Resection                            and retrieval were complete. Estimated blood loss                            was minimal.                           A 5-6 mm polyp was found in the ascending colon.                            The polyp was sessile. The polyp was removed with a                            piecemeal technique using a cold snare. Resection                            and retrieval were complete. Estimated blood loss                            was minimal.                           The exam was otherwise without abnormality on                            direct and retroflexion views except for small                             internal hemorrhoids. Complications:            No immediate complications. Estimated blood loss:                            Minimal. Estimated Blood Loss:     Estimated blood loss was minimal. Impression:               - Diverticulosis in the sigmoid colon.                           - Two 2 to 3 mm polyps in the descending colon,                            removed with a cold snare. Resected and retrieved.                           -  One 5-6 mm polyp in the ascending colon, removed                            piecemeal using a cold snare. Resected and                            retrieved.                           - The examination was otherwise normal on direct                            and retroflexion views. Recommendation:           - Patient has a contact number available for                            emergencies. The signs and symptoms of potential                            delayed complications were discussed with the                            patient. Return to normal activities tomorrow.                            Written discharge instructions were provided to the                            patient.                           - High fiber diet.                           - Continue present medications.                           - Await pathology results.                           - Referral for anorectal manometry.                           - Repeat colonoscopy date to be determined after                            pending pathology results are reviewed for                            surveillance.                           - Emerging evidence supports eating a diet of                            fruits, vegetables, grains, calcium, and yogurt  while reducing red meat and alcohol may reduce the                            risk of colon cancer.                           - Thank you for allowing me to be involved in your                             colon cancer prevention. Thornton Park MD, MD 05/11/2021 8:33:45 AM This report has been signed electronically.

## 2021-05-11 NOTE — Progress Notes (Signed)
Report to PACU, RN, vss, BBS= Clear.  

## 2021-05-11 NOTE — Progress Notes (Signed)
Pt's states no medical or surgical changes since previsit or office visit. 

## 2021-05-11 NOTE — Progress Notes (Signed)
Referring Provider: Horald Pollen, * Primary Care Physician:  Horald Pollen, MD  Reason for Procedure:  Colon cancer surveillance   IMPRESSION:  Change in bowel habits Need for colon cancer surveillance History of colon polyps Appropriate candidate for monitored anesthesia care  PLAN: Colonoscopy in the [place] today   HPI: Megan Bradford is a 70 y.o. female presents for colonoscopy to evaluate a change in bowel habits and blood in the stool.  Received patient's records from [facility] regarding colonoscopy.   08/09/2016 colonoscopy with one 8 mm polyp in the ascending colon, one 4 mm polyp in the ascending colon, one 2 millimeter polyp in the transverse colon, one 4 mm polyp in the rectum, one 3 mm polyp in the rectum and diverticulosis in the left colon.  Exam otherwise normal.  (We do not have pathology results)  Past Medical History:  Diagnosis Date   Cataract    Phreesia 11/24/2019- removed both   Chronic kidney disease    Phreesia 11/24/2019   Clotting disorder ([place])    PE 2010 per pt there really was not a PE   Heart murmur    Hypertension    Phreesia 11/24/2019   Kidney transplanted    Pre-diabetes    no meds   Pulmonary embolism ([place]) 2010    Past Surgical History:  Procedure Laterality Date   CARDIAC CATHETERIZATION     x2   CESAREAN SECTION N/A    Phreesia 11/24/2019   CHOLECYSTECTOMY N/A    Phreesia 11/24/2019   COLONOSCOPY     DILATION AND CURETTAGE OF UTERUS     EYE SURGERY N/A    Cataracts removed [employer]reesia 11/24/2019   HIP SURGERY Left 1990   Hip dysplasia cleaned out per patient   KIDNEY TRANSPLANT     POLYPECTOMY     HPP   TONSILLECTOMY     TRANSFORAMINAL LUMBAR INTERBODY FUSION (TLIF) WITH PEDICLE SCREW FIXATION 1 LEVEL N/A 12/01/2020   Procedure: Transiforaminal Lumbar Interbody Fusion Lumbar four--Lumbar five;  Surgeon: Vallarie Mare, MD;  Location: [place];  Service: Neurosurgery;  Laterality: N/A;    TRIGGER FINGER RELEASE Right 10/28/2020   Long finger    Current Outpatient Medications  Medication Sig Dispense Refill   acetaminophen (TYLENOL) 650 MG CR tablet Take 650 mg by mouth every 8 (eight) hours as needed for pain.     amLODipine (NORVASC) 5 MG tablet Take 5 mg by mouth daily.     aspirin EC 81 MG tablet Take 81 mg by mouth daily. Swallow whole.     carvedilol (COREG) 12.5 MG tablet Take 12.5 mg by mouth 2 (two) times daily with a meal.     Cholecalciferol (VITAMIN D3) 10 MCG (400 UNIT) CAPS Take 400 Units by mouth daily.     Coenzyme Q10 (CO Q10 PO) Take 1 capsule by mouth daily.     Cyanocobalamin (VITAMIN B 12 PO) Take 1 tablet by mouth daily.     lisinopril (ZESTRIL) 5 MG tablet Take 5 mg by mouth daily.     magnesium oxide (MAG-OX) 400 MG tablet Take 400 mg by mouth daily.     Misc Natural Products (NEURIVA) CHEW Chew 2 tablets by mouth daily.     multivitamin-lutein (OCUVITE-LUTEIN) CAPS capsule Take 1 capsule by mouth daily.     mycophenolate (MYFORTIC) 180 MG EC tablet Take 180 mg by mouth 2 (two) times daily.     rosuvastatin (CRESTOR) 20 MG tablet Take 1 tablet (20  mg total) by mouth daily. 90 tablet 3   tacrolimus (PROGRAF) 1 MG capsule Take 4 capsules by mouth in the morning.  Take 2 capsules by mouth in the evening. (Patient taking differently: Take 2-4 mg by mouth See admin instructions. Take 4 mg by mouth in the morning and 2 mg in the evening.) 540 capsule 3   cyclobenzaprine (FLEXERIL) 10 MG tablet Take 1 tablet (10 mg total) by mouth 3 (three) times daily as needed for muscle spasms. 45 tablet 0   diclofenac Sodium (VOLTAREN) 1 % GEL Apply 2 g topically 4 (four) times daily. (Patient not taking: Reported on 11/24/2020) 100 g 0   LORazepam (ATIVAN) 0.5 MG tablet Take 0.5 mg by mouth every 8 (eight) hours.     ondansetron (ZOFRAN) 4 MG tablet Take 1 tablet (4 mg total) by mouth every 8 (eight) hours as needed for nausea or vomiting. (Patient not taking: Reported on  04/27/2021) 40 tablet 0   Current Facility-Administered Medications  Medication Dose Route Frequency Provider Last Rate Last Admin   0.9 %  sodium chloride infusion  500 mL Intravenous Once Thornton Park, MD        Allergies as of 05/11/2021 - Review Complete 05/11/2021  Allergen Reaction Noted   Keflex [cephalexin] Anaphylaxis 12/17/2019   Ibuprofen  11/24/2020   Xanax [alprazolam] Other (See Comments) 05/26/2020    Family History  Problem Relation Age of Onset   Diabetes Mother    Asthma Mother    Hypertension Father    Coronary artery disease Father    Colon cancer Neg Hx    Colon polyps Neg Hx    Esophageal cancer Neg Hx    Rectal cancer Neg Hx    Stomach cancer Neg Hx      Physical Exam: General:   Alert,  well-nourished, pleasant and cooperative in NAD Head:  Normocephalic and atraumatic. Eyes:  Sclera clear, no icterus.   Conjunctiva pink. Mouth:  No deformity or lesions.   Neck:  Supple; no masses or thyromegaly. Lungs:  Clear throughout to auscultation.   No wheezes. Heart:  Regular rate and rhythm; no murmurs. Abdomen:  Soft, non-tender, nondistended, normal bowel sounds, no rebound or guarding.  Msk:  Symmetrical. No boney deformities LAD: No inguinal or umbilical LAD Extremities:  No clubbing or edema. Neurologic:  Alert and  oriented x4;  grossly nonfocal Skin:  No obvious rash or bruise. Psych:  Alert and cooperative. Normal mood and affect.     Studies/Results: No results found.    Megan Mccollister L. Tarri Glenn, MD, MPH 05/11/2021, 8:05 AM

## 2021-05-11 NOTE — Progress Notes (Signed)
Called to room to assist during endoscopic procedure.  Patient ID and intended procedure confirmed with present staff. Received instructions for my participation in the procedure from the performing physician.  

## 2021-05-11 NOTE — Patient Instructions (Signed)
Discharge instructions given. Handouts on polyps,diverticulosis and hemorrhoids. Resume previous medications. YOU HAD AN ENDOSCOPIC PROCEDURE TODAY AT [facility]:   Refer to the procedure report that was given to you for any specific questions about what was found during the examination.  If the procedure report does not answer your questions, please call your gastroenterologist to clarify.  If you requested that your care partner not be given the details of your procedure findings, then the procedure report has been included in a sealed envelope for you to review at your convenience later.  YOU SHOULD EXPECT: Some feelings of bloating in the abdomen. Passage of more gas than usual.  Walking can help get rid of the air that was put into your GI tract during the procedure and reduce the bloating. If you had a lower endoscopy (such as a colonoscopy or flexible sigmoidoscopy) you may notice spotting of blood in your stool or on the toilet paper. If you underwent a bowel prep for your procedure, you may not have a normal bowel movement for a few days.  Please Note:  You might notice some irritation and congestion in your nose or some drainage.  This is from the oxygen used during your procedure.  There is no need for concern and it should clear up in a day or so.  SYMPTOMS TO REPORT IMMEDIATELY:  Following lower endoscopy (colonoscopy or flexible sigmoidoscopy):  Excessive amounts of blood in the stool  Significant tenderness or worsening of abdominal pains  Swelling of the abdomen that is new, acute  Fever of 100F or higher   For urgent or emergent issues, a gastroenterologist can be reached at any hour by calling [phone]. Do not use MyChart messaging for urgent concerns.    DIET:  We do recommend a small meal at first, but then you may proceed to your regular diet.  Drink plenty of fluids but you should avoid alcoholic beverages for 24 hours.  ACTIVITY:  You should  plan to take it easy for the rest of today and you should NOT DRIVE or use heavy machinery until tomorrow (because of the sedation medicines used during the test).    FOLLOW UP: Our staff will call the number listed on your records 48-72 hours following your procedure to check on you and address any questions or concerns that you may have regarding the information given to you following your procedure. If we do not reach you, we will leave a message.  We will attempt to reach you two times.  During this call, we will ask if you have developed any symptoms of COVID 19. If you develop any symptoms (ie: fever, flu-like symptoms, shortness of breath, cough etc.) before then, please call [phone].  If you test positive for Covid 19 in the 2 weeks post procedure, please call and report this information to [place].    If any biopsies were taken you will be contacted by phone or by letter within the next 1-3 weeks.  Please call us at [phone] if you have not heard about the biopsies in 3 weeks.    SIGNATURES/CONFIDENTIALITY: You and/or your care partner have signed paperwork which will be entered into your electronic medical record.  These signatures attest to the fact that that the information above on your After Visit Summary has been reviewed and is understood.  Full responsibility of the confidentiality of this discharge information lies with you and/or your care-partner.

## 2021-05-12 ENCOUNTER — Other Ambulatory Visit: Payer: Self-pay

## 2021-05-12 DIAGNOSIS — R194 Change in bowel habit: Secondary | ICD-10-CM

## 2021-05-12 DIAGNOSIS — R159 Full incontinence of feces: Secondary | ICD-10-CM

## 2021-05-13 ENCOUNTER — Telehealth: Payer: Self-pay | Admitting: *Deleted

## 2021-05-13 NOTE — Telephone Encounter (Signed)
°  Follow up Call-  Call back number 05/11/2021  Post procedure Call Back phone  # [phone]  Permission to leave phone message Yes     Patient questions:  Do you have a fever, pain , or abdominal swelling? No. Pain Score  0 *  Have you tolerated food without any problems? Yes.    Have you been able to return to your normal activities? Yes.    Do you have any questions about your discharge instructions: Diet   No. Medications  No. Follow up visit  No.  Do you have questions or concerns about your Care? No.  Actions: * If pain score is 4 or above: No action needed, pain <4.  Have you developed a fever since your procedure? no  2.   Have you had an respiratory symptoms (SOB or cough) since your procedure? no  3.   Have you tested positive for COVID 19 since your procedure no  4.   Have you had any family members/close contacts diagnosed with the COVID 19 since your procedure?  no   If yes to any of these questions please route to Joylene John, RN and Joella Prince, RN

## 2021-05-17 ENCOUNTER — Encounter: Payer: Self-pay | Admitting: Gastroenterology

## 2021-05-18 ENCOUNTER — Telehealth: Payer: Self-pay

## 2021-05-18 NOTE — Telephone Encounter (Signed)
Received call from Endo advising about a scheduling conflict for 08/16/60 Anorectal Manometry. Procedure has been rescheduled to 08/03/21 @ 1030am, arrival time 1000am. Pt has not yet reviewed prep instructions via My Chart. Updated prep instructions have been sent to pt via My Chart with explanation for this scheduling change. Refer below:  Megan Bradford,  We sent you prep instructions previously but due to a scheduling conflict, your procedure date has changed. Please refer to your UPDATED prep instructions below:   Dr. Tarri Glenn has advised that you be scheduled for an Anorectal Manometry procedure. Below are your prep instructions:   ANORECTAL MANOMETRY   RESCHEDULED DATE: 08/03/21 TIME: 10:30 AM ARRIVAL TIME: 10:00 AM FOR REGISTRATION LOCATION: [facility] (1st floor of the hospital-admissions).     SHOPPING GUIDE:   1 Fleets enema - found in the laxative section at your drug store   PREP INSTRUCTIONS:   Insert Fleets enema rectally and administer entire contents of bottle 2 hours prior to coming for your appointment. Do not eat anything during the two hours prior to the procedure.  You may take regular medications with small sips of water at least 2 hours prior to the study.   Anorectal manometry -  is a test performed to evaluate patients with constipation or fecal incontinence. This test measures the pressures of the anal sphincter muscles, the sensation in the rectum, and the neural reflexes that are needed for normal bowel movements.   THE PROCEDURE - The test takes approximately 30 minutes to 1 hour. You will be asked to change into a hospital gown. A technician or nurse will explain the procedure to you, take a brief health history, and answer any questions you may have. The patient then lies on his or her left side. A small, flexible tube, about the size of a thermometer, with a balloon at the end is inserted into the rectum. The catheter is connected to a machine that  measures the pressure. During the test, the small balloon attached to the catheter may be inflated in the rectum to assess the normal reflex pathways. The nurse or technician may also ask the person to squeeze, relax, and push at various times. The anal sphincter muscle pressures are measured during each of these maneuvers. To squeeze, the patient tightens the sphincter muscles as if trying to prevent anything from coming out. To push or bear down, the patient strains down as if trying to have a bowel movement.

## 2021-05-19 NOTE — Telephone Encounter (Signed)
Letter containing prep instructions has been generated and has been mailed. Will continue efforts to contact pt via phone as well.

## 2021-05-19 NOTE — Telephone Encounter (Signed)
Called pt to inform about changes mentioned below. LVM requesting returned call.

## 2021-05-19 NOTE — Telephone Encounter (Signed)
Pt returned call. Informed about date/time/location for anorectal manometry as detailed below. Pt laughed sarcastically and stated, "I beg your pardon." Reminded about her most recent endoscopy and the recommendations provided at the time of her endoscopy as well as rationale for the ordered exam. Advised this information has been sent both via My Chart and by mail. Pt immediately interrupted to state, "I do not use My Chart." Expressed my apologies. Per pt request, account has been deactivated. Pt then proceeded to state, "not one person has called me to discuss my results." Advised results are not yet available and do require ample time for providers to review and to provide their recommendations based upon the results. Timing varies and may take up to 3-5 weeks. Pt abruptly stated, "okay, 3-5 weeks", continued to add, "so long", then disconnected.

## 2021-06-01 ENCOUNTER — Other Ambulatory Visit: Payer: Self-pay | Admitting: Obstetrics & Gynecology

## 2021-07-26 ENCOUNTER — Encounter (HOSPITAL_COMMUNITY): Payer: Self-pay | Admitting: Gastroenterology

## 2021-08-03 ENCOUNTER — Encounter (HOSPITAL_COMMUNITY)
Admission: RE | Disposition: A | Discharge status: Home / Self Care | Payer: Self-pay | Source: Home / Self Care | Attending: Gastroenterology

## 2021-08-03 ENCOUNTER — Encounter (HOSPITAL_COMMUNITY): Payer: Self-pay | Admitting: Gastroenterology

## 2021-08-03 ENCOUNTER — Ambulatory Visit (HOSPITAL_COMMUNITY)
Admission: RE | Admit: 2021-08-03 | Discharge: 2021-08-03 | Disposition: A | Discharge status: Home / Self Care | Payer: Medicare Other | Attending: Gastroenterology | Admitting: Gastroenterology

## 2021-08-03 DIAGNOSIS — R159 Full incontinence of feces: Secondary | ICD-10-CM | POA: Insufficient documentation

## 2021-08-03 DIAGNOSIS — R152 Fecal urgency: Secondary | ICD-10-CM

## 2021-08-03 DIAGNOSIS — K6289 Other specified diseases of anus and rectum: Secondary | ICD-10-CM | POA: Insufficient documentation

## 2021-08-03 DIAGNOSIS — K5902 Outlet dysfunction constipation: Secondary | ICD-10-CM

## 2021-08-03 HISTORY — PX: ANAL RECTAL MANOMETRY: SHX6358

## 2021-08-03 SURGERY — MANOMETRY, ANORECTAL
Anesthesia: LOCAL

## 2021-08-03 NOTE — Progress Notes (Signed)
Anorectal manometry performed per protocol with two RNs present.  Balloon expulsion test was then performed with 50cc balloon.  Patient unable to expel balloon at 3 minutes.  Water was removed from balloon and procedure concluded.  Patient tolerated well.  Report to be sent to Dr Harl Bowie. ?

## 2021-08-09 DIAGNOSIS — R159 Full incontinence of feces: Secondary | ICD-10-CM

## 2021-08-09 DIAGNOSIS — R152 Fecal urgency: Secondary | ICD-10-CM

## 2021-08-09 DIAGNOSIS — K5902 Outlet dysfunction constipation: Secondary | ICD-10-CM

## 2021-08-10 NOTE — H&P (Signed)
? ?Referring Provider: No ref. provider found ?Primary Care Physician:  Horald Pollen, MD ? ?Indication for Anorectal Manometry:  Fecal incontinence  ? ? ?IMPRESSION:  ?Fecal incontinence ? ?PLAN: ?Anorectal manometry ? ? ?HPI: Megan Bradford is a 71 y.o. female presents for anorectal manometry with a recent history of fecal incontinence not explained by colonoscopy.  ? ? ?Past Medical History:  ?Diagnosis Date  ? Cataract   ? Phreesia 11/24/2019- removed both  ? Chronic kidney disease   ? Phreesia 11/24/2019  ? Clotting disorder ([place])   ? PE 2010 per pt there really was not a PE  ? Heart murmur   ? Hypertension   ? Phreesia 11/24/2019  ? Kidney transplanted   ? Pre-diabetes   ? no meds  ? Pulmonary embolism ([place]) 2010  ? ? ?Past Surgical History:  ?Procedure Laterality Date  ? ANAL RECTAL MANOMETRY N/A 08/03/2021  ? Procedure: ANO RECTAL MANOMETRY;  Surgeon: Thornton Park, MD;  Location: [facility];  Service: Gastroenterology;  Laterality: N/A;  ? CARDIAC CATHETERIZATION    ? x2  ? CESAREAN SECTION N/A   ? Phreesia 11/24/2019  ? CHOLECYSTECTOMY N/A   ? Phreesia 11/24/2019  ? COLONOSCOPY    ? DILATION AND CURETTAGE OF UTERUS    ? EYE SURGERY N/A   ? Cataracts removed [facility]reesia 11/24/2019  ? HIP SURGERY Left 1990  ? Hip dysplasia cleaned out per patient  ? KIDNEY TRANSPLANT    ? POLYPECTOMY    ? HPP  ? TONSILLECTOMY    ? TRANSFORAMINAL LUMBAR INTERBODY FUSION (TLIF) WITH PEDICLE SCREW FIXATION 1 LEVEL N/A 12/01/2020  ? Procedure: Transiforaminal Lumbar Interbody Fusion Lumbar four--Lumbar five;  Surgeon: Vallarie Mare, MD;  Location: [place];  Service: Neurosurgery;  Laterality: N/A;  ? TRIGGER FINGER RELEASE Right 10/28/2020  ? Long finger  ? ? ?No current facility-administered medications for this encounter.  ? ?Current Outpatient Medications  ?Medication Sig Dispense Refill  ? acetaminophen (TYLENOL) 650 MG CR tablet Take 650 mg by mouth every 8 (eight) hours as needed for pain.    ? amLODipine  (NORVASC) 5 MG tablet Take 5 mg by mouth daily.    ? aspirin EC 81 MG tablet Take 81 mg by mouth daily. Swallow whole.    ? carvedilol (COREG) 12.5 MG tablet Take 12.5 mg by mouth 2 (two) times daily with a meal.    ? Cholecalciferol (VITAMIN D3) 10 MCG (400 UNIT) CAPS Take 400 Units by mouth daily.    ? Coenzyme Q10 (CO Q10 PO) Take 1 capsule by mouth daily.    ? Cyanocobalamin (VITAMIN B 12 PO) Take 1 tablet by mouth daily.    ? cyclobenzaprine (FLEXERIL) 10 MG tablet Take 1 tablet (10 mg total) by mouth 3 (three) times daily as needed for muscle spasms. 45 tablet 0  ? diclofenac Sodium (VOLTAREN) 1 % GEL Apply 2 g topically 4 (four) times daily. (Patient not taking: Reported on 11/24/2020) 100 g 0  ? lisinopril (ZESTRIL) 5 MG tablet Take 5 mg by mouth daily.    ? LORazepam (ATIVAN) 0.5 MG tablet Take 0.5 mg by mouth every 8 (eight) hours.    ? magnesium oxide (MAG-OX) 400 MG tablet Take 400 mg by mouth daily.    ? Misc Natural Products (NEURIVA) CHEW Chew 2 tablets by mouth daily.    ? multivitamin-lutein (OCUVITE-LUTEIN) CAPS capsule Take 1 capsule by mouth daily.    ? mycophenolate (MYFORTIC) 180 MG EC tablet Take 180  mg by mouth 2 (two) times daily.    ? ondansetron (ZOFRAN) 4 MG tablet Take 1 tablet (4 mg total) by mouth every 8 (eight) hours as needed for nausea or vomiting. (Patient not taking: Reported on 04/27/2021) 40 tablet 0  ? rosuvastatin (CRESTOR) 20 MG tablet Take 1 tablet (20 mg total) by mouth daily. 90 tablet 3  ? tacrolimus (PROGRAF) 1 MG capsule Take 4 capsules by mouth in the morning.  Take 2 capsules by mouth in the evening. (Patient taking differently: Take 2-4 mg by mouth See admin instructions. Take 4 mg by mouth in the morning and 2 mg in the evening.) 540 capsule 3  ? ? ?Allergies as of 05/12/2021 - Review Complete 05/11/2021  ?Allergen Reaction Noted  ? Keflex [cephalexin] Anaphylaxis 12/17/2019  ? Ibuprofen  11/24/2020  ? Xanax [alprazolam] Other (See Comments) 05/26/2020  ? ? ?Family  History  ?Problem Relation Age of Onset  ? Diabetes Mother   ? Asthma Mother   ? Hypertension Father   ? Coronary artery disease Father   ? Colon cancer Neg Hx   ? Colon polyps Neg Hx   ? Esophageal cancer Neg Hx   ? Rectal cancer Neg Hx   ? Stomach cancer Neg Hx   ? ? ? ?Physical Exam 05/11/21: ?General:   Alert,  well-nourished, pleasant and cooperative in NAD ?Head:  Normocephalic and atraumatic. ?Eyes:  Sclera clear, no icterus.   Conjunctiva pink. ?Mouth:  No deformity or lesions.   ?Neck:  Supple; no masses or thyromegaly. ?Lungs:  Clear throughout to auscultation.   No wheezes. ?Heart:  Regular rate and rhythm; no murmurs. ?Abdomen:  Soft, non-tender, nondistended, normal bowel sounds, no rebound or guarding.  ?Msk:  Symmetrical. No boney deformities ?LAD: No inguinal or umbilical LAD ?Extremities:  No clubbing or edema. ?Neurologic:  Alert and  oriented x4;  grossly nonfocal ?Skin:  No obvious rash or bruise. ?Psych:  Alert and cooperative. Normal mood and affect. ? ? ? ? ?Studies/Results: ?No results found. ? ? ? ?Yasir Kitner L. Tarri Glenn, MD, MPH ?08/10/2021, 11:27 AM ? ? ? ?  ?

## 2021-08-12 IMAGING — US US RENAL TRANSPLANT
1 series · 13 of 25 positions shown · non-contrast
Comparison: None

CLINICAL DATA: Chronic kidney disease and hypertension, post renal
transplantation, having RIGHT abdominal and back pain off and on for
months

EXAM:
ULTRASOUND OF RENAL TRANSPLANT WITH RENAL DOPPLER ULTRASOUND
TECHNIQUE: Ultrasound examination of the renal transplant was performed with
gray-scale, color and duplex doppler evaluation.

[Series 1: us renal transplant w/doppler · 13 of 26 slices shown]
[im 1/26]
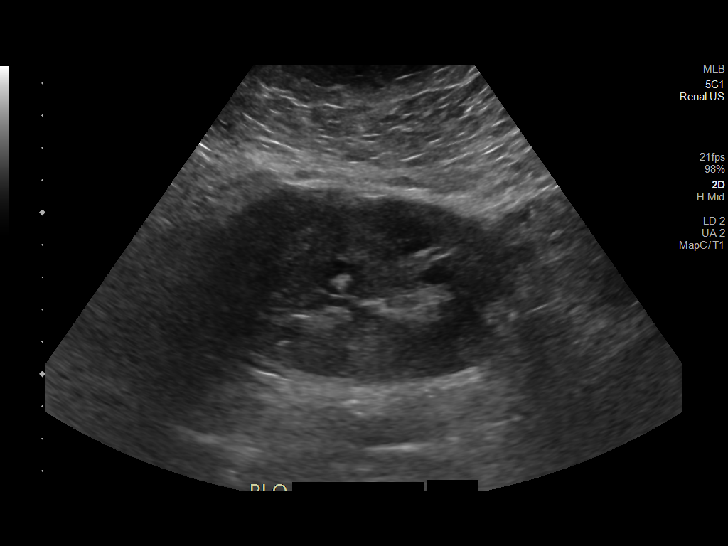
[im 3/26]
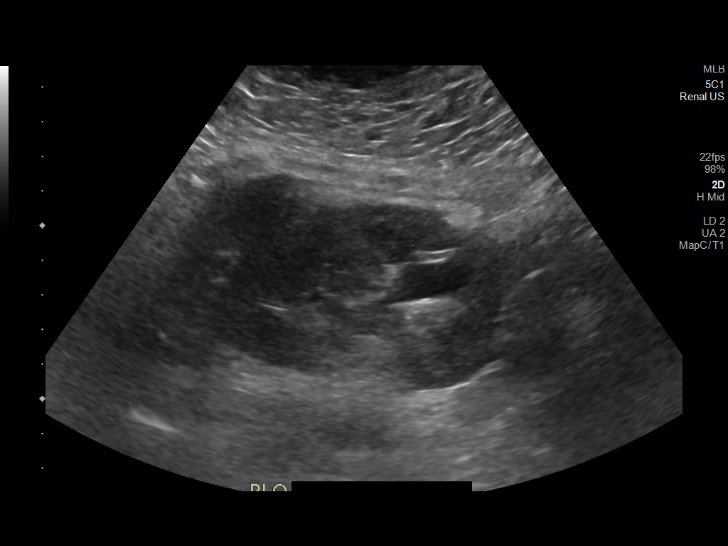
[im 5/26]
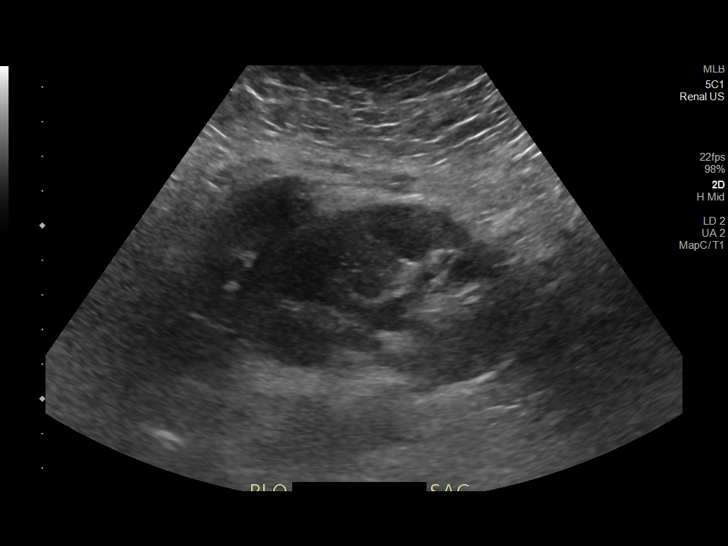
[im 7/26]
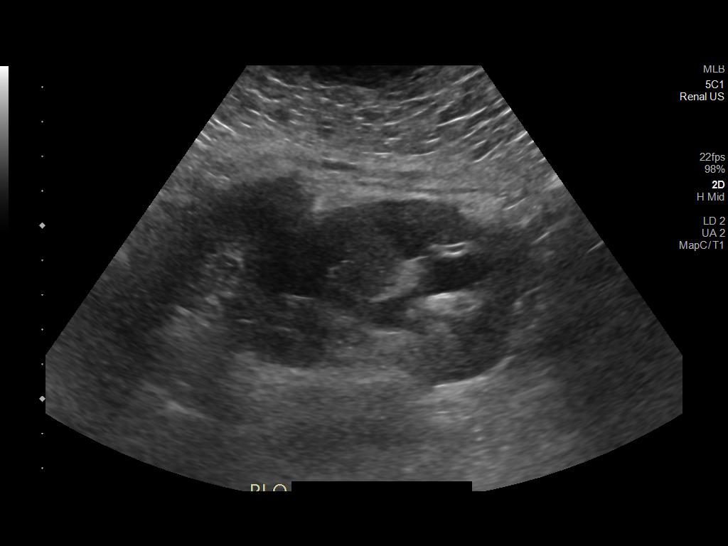
[im 9/26]
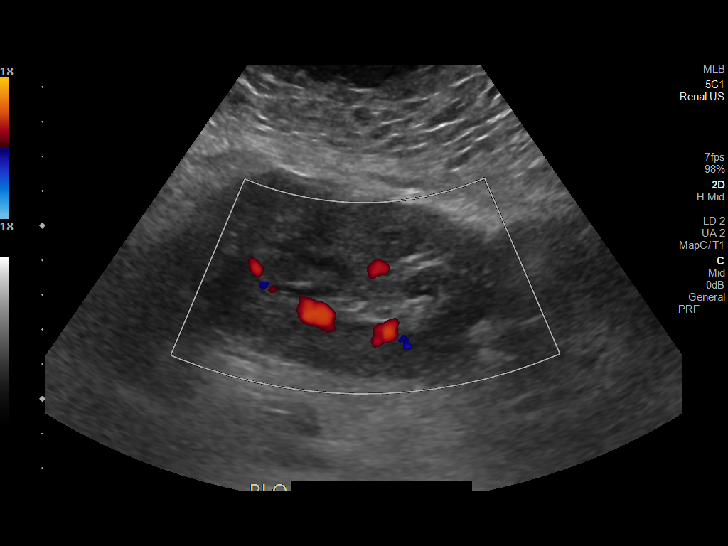
[im 11/26]
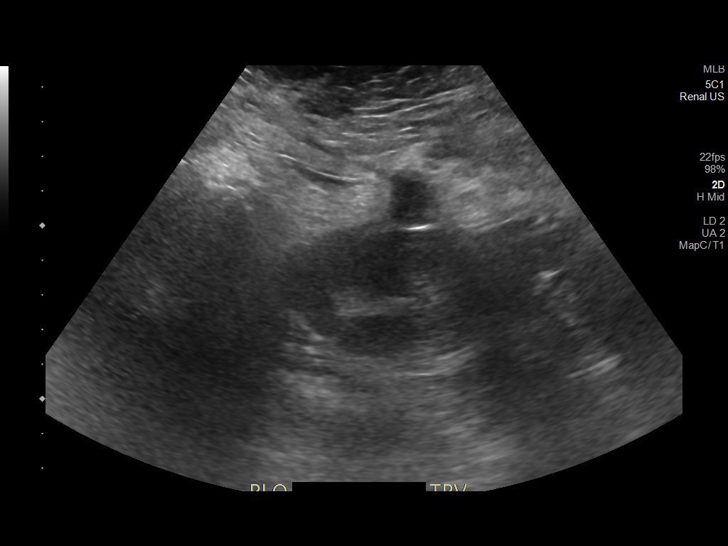
[im 13/26]
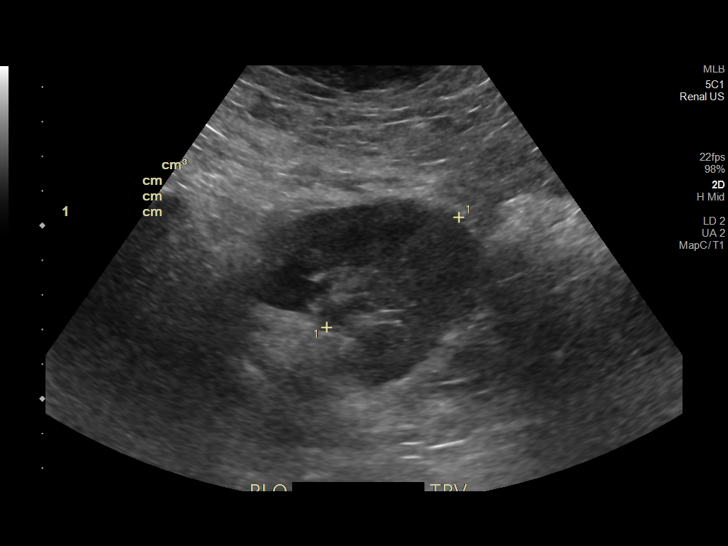
[im 15/26]
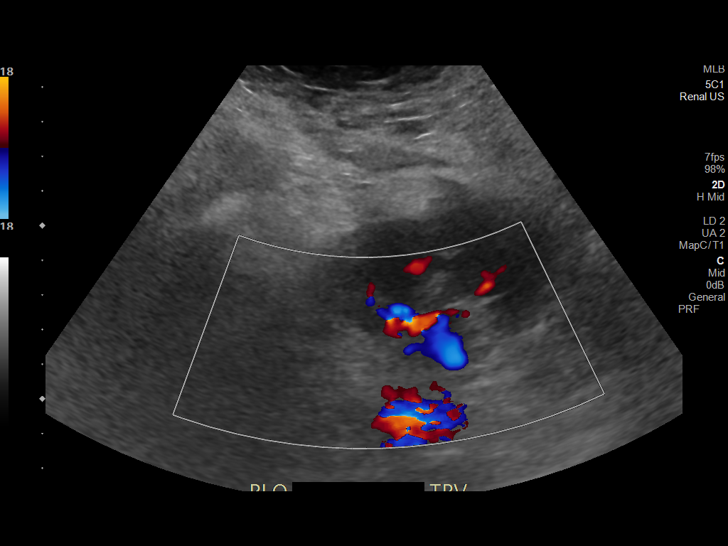
[im 17/26]
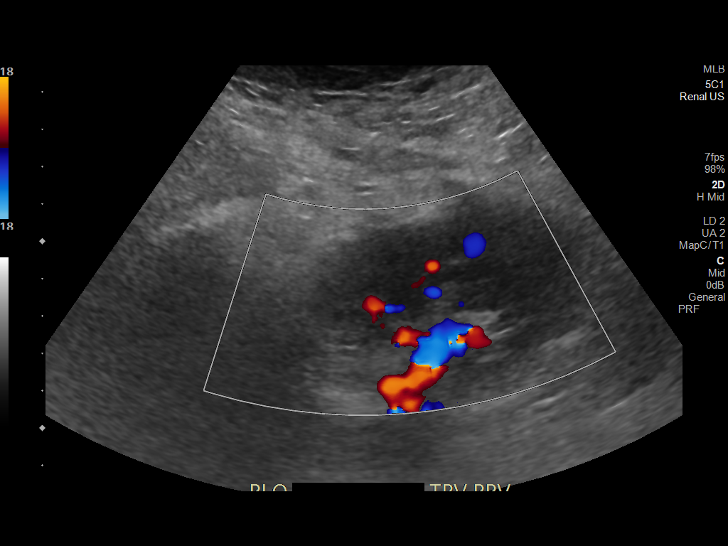
[im 19/26]
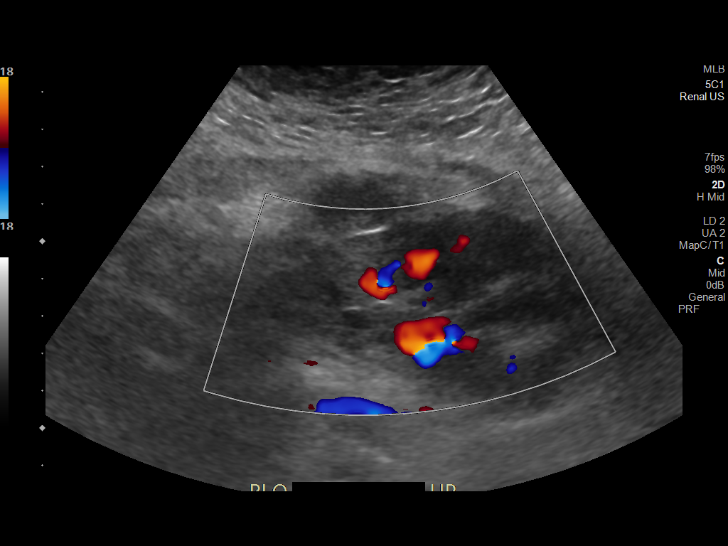
[im 21/26]
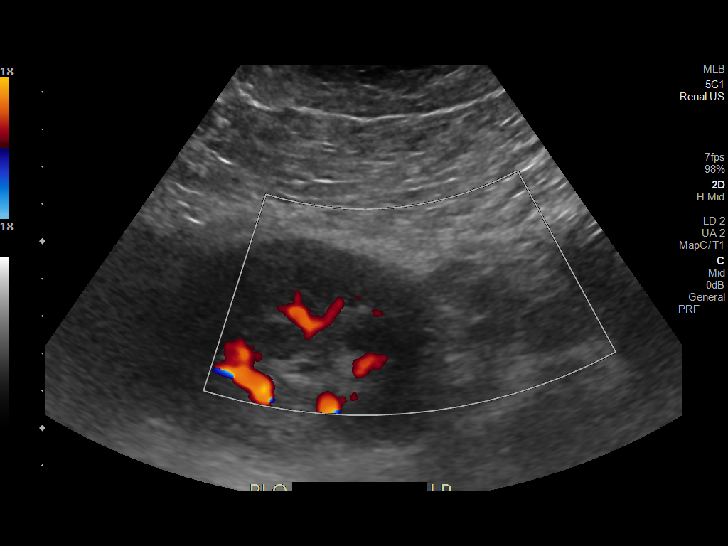
[im 23/26]
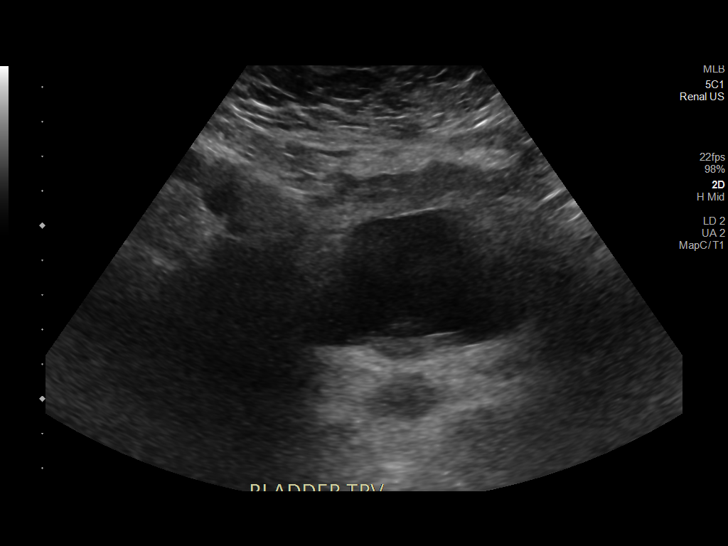
[im 26/26]
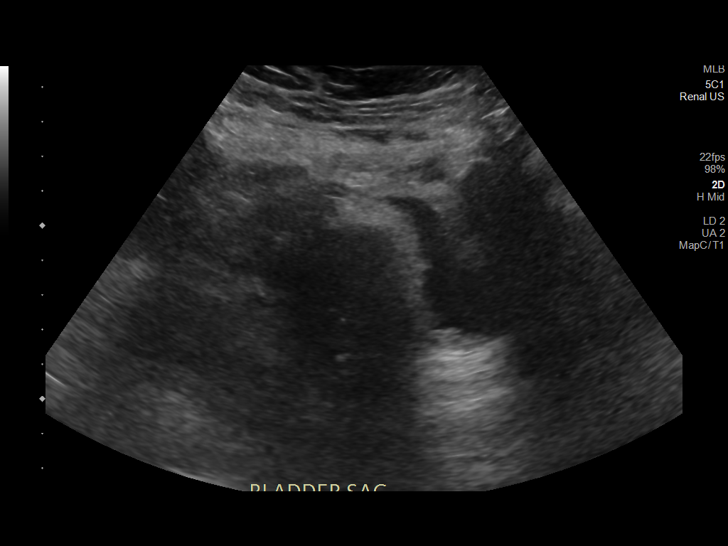

[13 of 25 positions shown; findings below may reference images not displayed]

FINDINGS: Transplant kidney location: RIGHT iliac fossa

Transplant Kidney:

Renal measurements: 8.6 x 5.1 x 5.0 cm = volume: 113mL. Normal
cortical thickness and echogenicity. Mild transplant hydronephrosis.
No renal mass or shadowing calcification.

Color flow in the main renal artery:  Present

Color flow in the main renal vein:  Present

Duplex Doppler Evaluation:

Main Renal Artery Velocity: 62 cm/sec

Main Renal Artery Resistive Index:

Venous waveform in main renal vein:  Present

Intrarenal resistive index in upper pole:

(normal 0.6-0.8; equivocal 0.8-0.9; abnormal >= 0.9)

Intrarenal resistive index in lower pole:

(normal 0.6-0.8; equivocal 0.8-0.9; abnormal >= 0.9)

Bladder: Normal for degree of bladder distention.

Other findings:  None
IMPRESSION: Mild hydronephrosis of transplant kidney in the RIGHT iliac fossa.

No evidence of renal mass.

Unremarkable renal Doppler evaluation.

## 2021-08-25 ENCOUNTER — Ambulatory Visit (INDEPENDENT_AMBULATORY_CARE_PROVIDER_SITE_OTHER): Payer: Medicare Other

## 2021-08-25 DIAGNOSIS — Z0000 Encounter for general adult medical examination without abnormal findings: Secondary | ICD-10-CM | POA: Diagnosis not present

## 2021-08-25 NOTE — Patient Instructions (Signed)
Megan Bradford , ?Thank you for taking time to come for your Medicare Wellness Visit. I appreciate your ongoing commitment to your health goals. Please review the following plan we discussed and let me know if I can assist you in the future.  ? ?Screening recommendations/referrals: ?Colonoscopy: 05/11/2021; due every 5 years ?Mammogram: 07/16/2021; due every year ?Bone Density: 01/31/2015 ?Recommended yearly ophthalmology/optometry visit for glaucoma screening and checkup ?Recommended yearly dental visit for hygiene and checkup ? ?Vaccinations: ?Influenza vaccine: 03/15/2021 ?Pneumococcal vaccine: 03/27/2019, 03/01/2020 ?Tdap vaccine: never done ?Shingles vaccine: never done   ?Covid-19: 07/09/2019, 07/30/2019 ? ?Advanced directives: No ? ?Conditions/risks identified: Yes ? ?Next appointment: Please schedule your next Medicare Wellness Visit with your Nurse Health Advisor in 1 year by calling [phone]. ? ? ?Preventive Care 27 Years and Older, Female ?Preventive care refers to lifestyle choices and visits with your health care provider that can promote health and wellness. ?What does preventive care include? ?A yearly physical exam. This is also called an annual well check. ?Dental exams once or twice a year. ?Routine eye exams. Ask your health care provider how often you should have your eyes checked. ?Personal lifestyle choices, including: ?Daily care of your teeth and gums. ?Regular physical activity. ?Eating a healthy diet. ?Avoiding tobacco and drug use. ?Limiting alcohol use. ?Practicing safe sex. ?Taking low-dose aspirin every day. ?Taking vitamin and mineral supplements as recommended by your health care provider. ?What happens during an annual well check? ?The services and screenings done by your health care provider during your annual well check will depend on your age, overall health, lifestyle risk factors, and family history of disease. ?Counseling  ?Your health care provider may ask you questions about  your: ?Alcohol use. ?Tobacco use. ?Drug use. ?Emotional well-being. ?Home and relationship well-being. ?Sexual activity. ?Eating habits. ?History of falls. ?Memory and ability to understand (cognition). ?Work and work Statistician. ?Reproductive health. ?Screening  ?You may have the following tests or measurements: ?Height, weight, and BMI. ?Blood pressure. ?Lipid and cholesterol levels. These may be checked every 5 years, or more frequently if you are over 65 years old. ?Skin check. ?Lung cancer screening. You may have this screening every year starting at age 24 if you have a 30-pack-year history of smoking and currently smoke or have quit within the past 15 years. ?Fecal occult blood test (FOBT) of the stool. You may have this test every year starting at age 69. ?Flexible sigmoidoscopy or colonoscopy. You may have a sigmoidoscopy every 5 years or a colonoscopy every 10 years starting at age 27. ?Hepatitis C blood test. ?Hepatitis B blood test. ?Sexually transmitted disease (STD) testing. ?Diabetes screening. This is done by checking your blood sugar (glucose) after you have not eaten for a while (fasting). You may have this done every 1-3 years. ?Bone density scan. This is done to screen for osteoporosis. You may have this done starting at age 48. ?Mammogram. This may be done every 1-2 years. Talk to your health care provider about how often you should have regular mammograms. ?Talk with your health care provider about your test results, treatment options, and if necessary, the need for more tests. ?Vaccines  ?Your health care provider may recommend certain vaccines, such as: ?Influenza vaccine. This is recommended every year. ?Tetanus, diphtheria, and acellular pertussis (Tdap, Td) vaccine. You may need a Td booster every 10 years. ?Zoster vaccine. You may need this after age 68. ?Pneumococcal 13-valent conjugate (PCV13) vaccine. One dose is recommended after age 95. ?Pneumococcal polysaccharide (PPSV23) vaccine.  One dose is recommended after age 4. ?Talk to your health care provider about which screenings and vaccines you need and how often you need them. ?This information is not intended to replace advice given to you by your health care provider. Make sure you discuss any questions you have with your health care provider. ?Document Released: 05/28/2015 Document Revised: 01/19/2016 Document Reviewed: 03/02/2015 ?Elsevier Interactive Patient Education ? 2017 [employer]. ? ?Fall Prevention in the Home ?Falls can cause injuries. They can happen to people of all ages. There are many things you can do to make your home safe and to help prevent falls. ?What can I do on the outside of my home? ?Regularly fix the edges of walkways and driveways and fix any cracks. ?Remove anything that might make you trip as you walk through a door, such as a raised step or threshold. ?Trim any bushes or trees on the path to your home. ?Use bright outdoor lighting. ?Clear any walking paths of anything that might make someone trip, such as rocks or tools. ?Regularly check to see if handrails are loose or broken. Make sure that both sides of any steps have handrails. ?Any raised decks and porches should have guardrails on the edges. ?Have any leaves, snow, or ice cleared regularly. ?Use sand or salt on walking paths during winter. ?Clean up any spills in your garage right away. This includes oil or grease spills. ?What can I do in the bathroom? ?Use night lights. ?Install grab bars by the toilet and in the tub and shower. Do not use towel bars as grab bars. ?Use non-skid mats or decals in the tub or shower. ?If you need to sit down in the shower, use a plastic, non-slip stool. ?Keep the floor dry. Clean up any water that spills on the floor as soon as it happens. ?Remove soap buildup in the tub or shower regularly. ?Attach bath mats securely with double-sided non-slip rug tape. ?Do not have throw rugs and other things on the floor that can make  you trip. ?What can I do in the bedroom? ?Use night lights. ?Make sure that you have a light by your bed that is easy to reach. ?Do not use any sheets or blankets that are too big for your bed. They should not hang down onto the floor. ?Have a firm chair that has side arms. You can use this for support while you get dressed. ?Do not have throw rugs and other things on the floor that can make you trip. ?What can I do in the kitchen? ?Clean up any spills right away. ?Avoid walking on wet floors. ?Keep items that you use a lot in easy-to-reach places. ?If you need to reach something above you, use a strong step stool that has a grab bar. ?Keep electrical cords out of the way. ?Do not use floor polish or wax that makes floors slippery. If you must use wax, use non-skid floor wax. ?Do not have throw rugs and other things on the floor that can make you trip. ?What can I do with my stairs? ?Do not leave any items on the stairs. ?Make sure that there are handrails on both sides of the stairs and use them. Fix handrails that are broken or loose. Make sure that handrails are as long as the stairways. ?Check any carpeting to make sure that it is firmly attached to the stairs. Fix any carpet that is loose or worn. ?Avoid having throw rugs at the top or bottom of the  stairs. If you do have throw rugs, attach them to the floor with carpet tape. ?Make sure that you have a light switch at the top of the stairs and the bottom of the stairs. If you do not have them, ask someone to add them for you. ?What else can I do to help prevent falls? ?Wear shoes that: ?Do not have high heels. ?Have rubber bottoms. ?Are comfortable and fit you well. ?Are closed at the toe. Do not wear sandals. ?If you use a stepladder: ?Make sure that it is fully opened. Do not climb a closed stepladder. ?Make sure that both sides of the stepladder are locked into place. ?Ask someone to hold it for you, if possible. ?Clearly mark and make sure that you can  see: ?Any grab bars or handrails. ?First and last steps. ?Where the edge of each step is. ?Use tools that help you move around (mobility aids) if they are needed. These include: ?Canes. ?Walkers. ?Scooters. ?

## 2021-08-25 NOTE — Progress Notes (Signed)
?I connected with Megan Bradford today by telephone and verified that I am speaking with the correct person using two identifiers. ?Location patient: home ?Location provider: work ?Persons participating in the virtual visit: patient, provider. ?  ?I discussed the limitations, risks, security and privacy concerns of performing an evaluation and management service by telephone and the availability of in person appointments. I also discussed with the patient that there may be a patient responsible charge related to this service. The patient expressed understanding and verbally consented to this telephonic visit.  ?  ?Interactive audio and video telecommunications were attempted between this provider and patient, however failed, due to patient having technical difficulties OR patient did not have access to video capability.  We continued and completed visit with audio only. ? ?Some vital signs may be absent or patient reported.  ? ?Time Spent with patient on telephone encounter: 30 minutes ? ?Subjective:  ? Megan Bradford is a 71 y.o. female who presents for an Initial Medicare Annual Wellness Visit. ? ?Review of Systems    ? ?Cardiac Risk Factors include: advanced age (>4mn, >>58women);dyslipidemia;family history of premature cardiovascular disease;hypertension ? ?   ?Objective:  ?  ?There were no vitals filed for this visit. ?There is no height or weight on file to calculate BMI. ? ? ?  08/25/2021  ? 10:18 AM 11/29/2020  ?  8:25 AM  ?Advanced Directives  ?Does Patient Have a Medical Advance Directive? No No  ?Would patient like information on creating a medical advance directive? No - Patient declined Yes (MAU/Ambulatory/Procedural Areas - Information given)  ? ? ?Current Medications (verified) ?Outpatient Encounter Medications as of 08/25/2021  ?Medication Sig  ? acetaminophen (TYLENOL) 650 MG CR tablet Take 650 mg by mouth every 8 (eight) hours as needed for pain.  ? amLODipine (NORVASC) 5 MG tablet Take 5 mg by  mouth daily.  ? aspirin EC 81 MG tablet Take 81 mg by mouth daily. Swallow whole.  ? carvedilol (COREG) 12.5 MG tablet Take 12.5 mg by mouth 2 (two) times daily with a meal.  ? Cholecalciferol (VITAMIN D3) 10 MCG (400 UNIT) CAPS Take 400 Units by mouth daily.  ? Coenzyme Q10 (CO Q10 PO) Take 1 capsule by mouth daily.  ? Cyanocobalamin (VITAMIN B 12 PO) Take 1 tablet by mouth daily.  ? cyclobenzaprine (FLEXERIL) 10 MG tablet Take 1 tablet (10 mg total) by mouth 3 (three) times daily as needed for muscle spasms.  ? diclofenac Sodium (VOLTAREN) 1 % GEL Apply 2 g topically 4 (four) times daily. (Patient not taking: Reported on 11/24/2020)  ? lisinopril (ZESTRIL) 5 MG tablet Take 5 mg by mouth daily.  ? LORazepam (ATIVAN) 0.5 MG tablet Take 0.5 mg by mouth every 8 (eight) hours.  ? magnesium oxide (MAG-OX) 400 MG tablet Take 400 mg by mouth daily.  ? Misc Natural Products (NEURIVA) CHEW Chew 2 tablets by mouth daily.  ? multivitamin-lutein (OCUVITE-LUTEIN) CAPS capsule Take 1 capsule by mouth daily.  ? mycophenolate (MYFORTIC) 180 MG EC tablet Take 180 mg by mouth 2 (two) times daily.  ? ondansetron (ZOFRAN) 4 MG tablet Take 1 tablet (4 mg total) by mouth every 8 (eight) hours as needed for nausea or vomiting. (Patient not taking: Reported on 04/27/2021)  ? rosuvastatin (CRESTOR) 20 MG tablet Take 1 tablet (20 mg total) by mouth daily.  ? tacrolimus (PROGRAF) 1 MG capsule Take 4 capsules by mouth in the morning.  Take 2 capsules by mouth in the evening. (Patient  taking differently: Take 2-4 mg by mouth See admin instructions. Take 4 mg by mouth in the morning and 2 mg in the evening.)  ? ?No facility-administered encounter medications on file as of 08/25/2021.  ? ? ?Allergies (verified) ?Keflex [cephalexin], Ibuprofen, and Xanax [alprazolam]  ? ?History: ?Past Medical History:  ?Diagnosis Date  ? Cataract   ? [employer] 11/24/2019- removed both  ? Chronic kidney disease   ? [employer] 11/24/2019  ? Clotting disorder ([place])   ?  PE 2010 per pt there really was not a PE  ? Heart murmur   ? Hypertension   ? [employer] 11/24/2019  ? Kidney transplanted   ? Pre-diabetes   ? no meds  ? Pulmonary embolism ([place]) 2010  ? ?Past Surgical History:  ?Procedure Laterality Date  ? ANAL RECTAL MANOMETRY N/A 08/03/2021  ? Procedure: ANO RECTAL MANOMETRY;  Surgeon: Thornton Park, MD;  Location: [facility];  Service: Gastroenterology;  Laterality: N/A;  ? CARDIAC CATHETERIZATION    ? x2  ? CESAREAN SECTION N/A   ? [employer] 11/24/2019  ? CHOLECYSTECTOMY N/A   ? [employer] 11/24/2019  ? COLONOSCOPY    ? DILATION AND CURETTAGE OF UTERUS    ? EYE SURGERY N/A   ? Cataracts removed [employer] 11/24/2019  ? HIP SURGERY Left 1990  ? Hip dysplasia cleaned out per patient  ? KIDNEY TRANSPLANT    ? POLYPECTOMY    ? HPP  ? TONSILLECTOMY    ? TRANSFORAMINAL LUMBAR INTERBODY FUSION (TLIF) WITH PEDICLE SCREW FIXATION 1 LEVEL N/A 12/01/2020  ? Procedure: Transiforaminal Lumbar Interbody Fusion Lumbar four--Lumbar five;  Surgeon: Vallarie Mare, MD;  Location: [facility];  Service: Neurosurgery;  Laterality: N/A;  ? TRIGGER FINGER RELEASE Right 10/28/2020  ? Long finger  ? ?Family History  ?Problem Relation Age of Onset  ? Diabetes Mother   ? Asthma Mother   ? Hypertension Father   ? Coronary artery disease Father   ? Colon cancer Neg Hx   ? Colon polyps Neg Hx   ? Esophageal cancer Neg Hx   ? Rectal cancer Neg Hx   ? Stomach cancer Neg Hx   ? ?Social History  ? ?Socioeconomic History  ? Marital status: Widowed  ?  Spouse name: Not on file  ? Number of children: Not on file  ? Years of education: Not on file  ? Highest education level: Not on file  ?Occupational History  ? Not on file  ?Tobacco Use  ? Smoking status: Never  ? Smokeless tobacco: Never  ?Vaping Use  ? Vaping Use: Never used  ?Substance and Sexual Activity  ? Alcohol use: Never  ? Drug use: Never  ? Sexual activity: Not Currently  ?  Birth control/protection: None  ?Other Topics Concern  ? Not on file  ?Social  History Narrative  ? Not on file  ? ?Social Determinants of Health  ? ?Financial Resource Strain: Low Risk   ? Difficulty of Paying Living Expenses: Not hard at all  ?Food Insecurity: No Food Insecurity  ? Worried About Charity fundraiser in the Last Year: Never true  ? Ran Out of Food in the Last Year: Never true  ?Transportation Needs: No Transportation Needs  ? Lack of Transportation (Medical): No  ? Lack of Transportation (Non-Medical): No  ?Physical Activity: Sufficiently Active  ? Days of Exercise per Week: 5 days  ? Minutes of Exercise per Session: 30 min  ?Stress: No Stress Concern Present  ? Feeling of  Stress : Not at all  ?Social Connections: Moderately Integrated  ? Frequency of Communication with Friends and Family: More than three times a week  ? Frequency of Social Gatherings with Friends and Family: More than three times a week  ? Attends Religious Services: More than 4 times per year  ? Active Member of Clubs or Organizations: Yes  ? Attends Archivist Meetings: More than 4 times per year  ? Marital Status: Widowed  ? ? ?Tobacco Counseling ?Counseling given: Not Answered ? ? ?Clinical Intake: ? ?Pre-visit preparation completed: Yes ? ?Pain : No/denies pain ? ?  ? ?Nutritional Risks: None ?Diabetes: No ? ?What is the last grade level you completed in school?: 2 years of college ? ?Diabetic? no ? ?Interpreter Needed?: No ? ?Information entered by :: Lisette Abu, LPN ? ? ?Activities of Daily Living ? ?  08/25/2021  ? 10:31 AM 11/29/2020  ?  8:33 AM  ?In your present state of health, do you have any difficulty performing the following activities:  ?Hearing? 0   ?Vision? 0   ?Difficulty concentrating or making decisions? 0   ?Walking or climbing stairs? 0   ?Dressing or bathing? 0   ?Doing errands, shopping? 0 0  ?Preparing Food and eating ? N   ?Using the Toilet? N   ?In the past six months, have you accidently leaked urine? N   ?Do you have problems with loss of bowel control? N    ?Managing your Medications? N   ?Managing your Finances? N   ?Housekeeping or managing your Housekeeping? N   ? ? ?Patient Care Team: ?Horald Pollen, MD as PCP - General (Internal Medicine) ?Johney Frame, Lemmie Evens

## 2021-08-29 ENCOUNTER — Telehealth (HOSPITAL_BASED_OUTPATIENT_CLINIC_OR_DEPARTMENT_OTHER): Payer: Self-pay

## 2021-08-29 NOTE — Telephone Encounter (Signed)
Called patient to verify that she is having surgery because we have not received a Preoperative clearance for her.  ? ?RN to call [facility] to request preop clearance. No answer, request faxed ?

## 2021-08-30 ENCOUNTER — Encounter (HOSPITAL_BASED_OUTPATIENT_CLINIC_OR_DEPARTMENT_OTHER): Payer: Self-pay | Admitting: Family

## 2021-08-30 ENCOUNTER — Ambulatory Visit (INDEPENDENT_AMBULATORY_CARE_PROVIDER_SITE_OTHER): Payer: Medicare Other | Admitting: Family

## 2021-08-30 VITALS — BP 110/72 | HR 70 | Ht 60.0 in | Wt 209.9 lb

## 2021-08-30 DIAGNOSIS — I10 Essential (primary) hypertension: Secondary | ICD-10-CM | POA: Diagnosis not present

## 2021-08-30 DIAGNOSIS — Z940 Kidney transplant status: Secondary | ICD-10-CM | POA: Diagnosis not present

## 2021-08-30 DIAGNOSIS — Z01818 Encounter for other preprocedural examination: Secondary | ICD-10-CM

## 2021-08-30 DIAGNOSIS — N1832 Chronic kidney disease, stage 3b: Secondary | ICD-10-CM

## 2021-08-30 DIAGNOSIS — E782 Mixed hyperlipidemia: Secondary | ICD-10-CM

## 2021-08-30 NOTE — Progress Notes (Signed)
? ?Office Visit  ?  ?Patient Name: Megan Bradford ?Date of Encounter: 08/30/2021 ? ?PCP:  Horald Pollen, MD ?  ?[place]  ?Cardiologist:  [name], MD  ?Advanced Practice Provider:  No care team member to display ?Electrophysiologist:  None  ?   ? ?Chief Complaint  ?  ?Megan Bradford is a 71 y.o. female with a hx of CAD, ESRD s/p renal transplant 2016 with CKD 3B, hyperlipidemia, hypertension, palpitations, SVT presents today for preop clearance  ? ?Past Medical History  ?  ?Past Medical History:  ?Diagnosis Date  ? Cataract   ? Phreesia 11/24/2019- removed both  ? Chronic kidney disease   ? Phreesia 11/24/2019  ? Clotting disorder ([place])   ? PE 2010 per pt there really was not a PE  ? Heart murmur   ? Hypertension   ? Phreesia 11/24/2019  ? Kidney transplanted   ? Pre-diabetes   ? no meds  ? Pulmonary embolism ([place]) 2010  ? ?Past Surgical History:  ?Procedure Laterality Date  ? ANAL RECTAL MANOMETRY N/A 08/03/2021  ? Procedure: ANO RECTAL MANOMETRY;  Surgeon: Thornton Park, MD;  Location: [facility];  Service: Gastroenterology;  Laterality: N/A;  ? CARDIAC CATHETERIZATION    ? x2  ? CESAREAN SECTION N/A   ? Phreesia 11/24/2019  ? CHOLECYSTECTOMY N/A   ? Phreesia 11/24/2019  ? COLONOSCOPY    ? DILATION AND CURETTAGE OF UTERUS    ? EYE SURGERY N/A   ? Cataracts removed Phreesia 11/24/2019  ? HIP SURGERY Left 1990  ? Hip dysplasia cleaned out per patient  ? KIDNEY TRANSPLANT    ? POLYPECTOMY    ? HPP  ? TONSILLECTOMY    ? TRANSFORAMINAL LUMBAR INTERBODY FUSION (TLIF) WITH PEDICLE SCREW FIXATION 1 LEVEL N/A 12/01/2020  ? Procedure: Transiforaminal Lumbar Interbody Fusion Lumbar four--Lumbar five;  Surgeon: Vallarie Mare, MD;  Location: [facility];  Service: Neurosurgery;  Laterality: N/A;  ? TRIGGER FINGER RELEASE Right 10/28/2020  ? Long finger  ? ? ?Allergies ? ?Allergies  ?Allergen Reactions  ? Keflex [Cephalexin] Anaphylaxis  ? Ibuprofen   ?  Raises blood pressure   ? Xanax [Alprazolam] Other (See Comments)  ? ? ?History of Present Illness  ?  ?Megan Bradford is a 71 y.o. 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 Johney Frame. ? ?She noted increasing palpitations November 2021 and monitor ordered which revealed several runs of SVT with longest 17 beats and occasional PAC.  She declined beta-blocker at that time.  Subsequent echocardiogram November 2021 with normal LVEF 65 to 70%, mild LVH, no wall motion abnormalities, grade 1 diastolic dysfunction, no significant valve abnormalities.  Due to known coronary calcification on CT scan, Lexiscan Myoview performed 06/2020 was low risk study with no evidence of ischemia. ? ?She presents today for follow-up.  She has upcoming laparoscopic surgery for removal of her ovaries due to cyst per her report.  No formal clearance received. Reports no shortness of breath nor dyspnea on exertion. Reports no chest pain, pressure, or tightness. No edema, orthopnea, PND. Reports no palpitations.  BP at home usually 120s/80s. She tells me she has been walking with her daughter for exercise.  ? ?EKGs/Labs/Other Studies Reviewed:  ? ?The following studies were reviewed today: ? ?Lexiscan Myoview 07/08/2020: ?Nuclear stress EF: 67%. ?There was no ST segment deviation noted during stress. ?The study is normal. ?  This is a low risk study. ?The left ventricular ejection fraction is hyperdynamic (>65%). ?  ?Normal pharmacologic nuclear stress test with no evidence for prior infarct or ischemia. LVEF 67%. ?  ?TTE 04/13/20: ?IMPRESSIONS  ? 1. Left ventricular ejection fraction, by estimation, is 65 to 70%. The  ?left ventricle has normal function. The left ventricle has no regional  ?wall motion abnormalities. There is mild concentric left ventricular  ?hypertrophy. Left ventricular diastolic  ?parameters are consistent with Grade I diastolic dysfunction (impaired   ?relaxation).  ? 2. Right ventricular systolic function is normal. The right ventricular  ?size is normal. There is normal pulmonary artery systolic pressure. The  ?estimated right ventricular systolic pressure is 31.5 mmHg.  ? 3. The mitral valve is normal in structure. Mild mitral valve  ?regurgitation. No evidence of mitral stenosis.  ? 4. The aortic valve is normal in structure. Aortic valve regurgitation is  ?not visualized. No aortic stenosis is present.  ? 5. The inferior vena cava is normal in size with greater than 50%  ?respiratory variability, suggesting right atrial pressure of 3 mmHg.  ?  ?LE ABI 04/15/20: ?Summary:  ?Right: Normal examination. No evidence of arterial occlusive disease.  ?Medial calcifications noted.  ? ?Left: Normal examination. No evidence of arterial occlusive disease.  ?Medial calcifications noted.  ?  ?Right: Resting right ankle-brachial index indicates noncompressible right  ?lower extremity arteries. The right toe-brachial index is normal.  ? ?Left: Resting left ankle-brachial index indicates noncompressible left  ?lower extremity arteries. The left toe-brachial index is normal.  ?  ?CT chest 06/17/20: ?IMPRESSION: ?1. No imaging stigmata of sarcoidosis. ?2. No imaging findings to suggest interstitial lung disease. ?3. Moderate air trapping indicative of small airways disease. ?4. Aortic atherosclerosis, in addition to left main and 2 vessel ?coronary artery disease. Please note that although the presence of ?coronary artery calcium documents the presence of coronary artery ?disease, the severity of this disease and any potential stenosis ?cannot be assessed on this non-gated CT examination. Assessment for ?potential risk factor modification, dietary therapy or pharmacologic ?therapy may be warranted, if clinically indicated. ?5. Indeterminate left adrenal nodule measuring 2.5 x 1.9 cm. ?Follow-up adrenal protocol CT scan is recommended for further ?characterization. This  recommendation follows ACR consensus ?guidelines: Management of Incidental Adrenal Masses: A White Paper ?of the ACR Incidental Findings Committee. J Am Coll Radiol 2017 (in ?Press). ?  ?PFT12/28/2020 ?FVC 1.76 [81%], FEV1 1.54 [90%], TLC 2.81 [62%], DLCO 13.05 [69%] ?Moderate restriction and diffusion impairment ?  ?Cardiac Monitor 03/2020: ?Predominant rhythm was NSR with average HR 76bpm (ranging from 56bpm to 190bpm) ?Patient had 250 runs of SVT with longest lasting 17 beats at average HR 117bpm. Fastest was 4 beats at rate 190bpm. ?Occasional PACs (2.5%; 34547 beats); PVCs were rare ?Rare couplets (<1%, 2956), rare triplets (<1% 1597) ?Patient triggered events mainly correlated with SR/sinus tachycardia with PACs. ?No Afib, VT or significant pauses ?  ?EKG:  EKG is  ordered today.  The ekg ordered today demonstrates NSR 70 bpm with no acute ST/T wave changes. ? ?Recent Labs: ?11/29/2020: Hemoglobin 14.8; Platelets 254 ?12/02/2020: BUN 22; Creatinine, Ser 1.79; Potassium 4.5; Sodium 136  ?Recent Lipid Panel ?   ?Component Value Date/Time  ? CHOL 203 (H) 11/27/2019 1019  ? TRIG 81 11/27/2019 1019  ? HDL 73 11/27/2019 1019  ? CHOLHDL 2.8 11/27/2019 1019  ? LDLCALC 116 (H) 11/27/2019 1019  ? ? ?Home Medications  ? ?Current Meds  ?Medication Sig  ?  acetaminophen (TYLENOL) 650 MG CR tablet Take 650 mg by mouth every 8 (eight) hours as needed for pain.  ? amLODipine (NORVASC) 5 MG tablet Take 5 mg by mouth daily.  ? aspirin EC 81 MG tablet Take 81 mg by mouth daily. Swallow whole.  ? carvedilol (COREG) 12.5 MG tablet Take 12.5 mg by mouth 2 (two) times daily with a meal.  ? Cholecalciferol (VITAMIN D3) 10 MCG (400 UNIT) CAPS Take 400 Units by mouth daily.  ? Coenzyme Q10 (CO Q10 PO) Take 1 capsule by mouth daily.  ? Cyanocobalamin (VITAMIN B 12 PO) Take 1 tablet by mouth daily.  ? cyclobenzaprine (FLEXERIL) 10 MG tablet Take 1 tablet (10 mg total) by mouth 3 (three) times daily as needed for muscle spasms.  ? diclofenac  Sodium (VOLTAREN) 1 % GEL Apply 2 g topically 4 (four) times daily.  ? lisinopril (ZESTRIL) 5 MG tablet Take 5 mg by mouth daily.  ? LORazepam (ATIVAN) 0.5 MG tablet Take 0.5 mg by mouth every 8 (eight) hou

## 2021-08-30 NOTE — Patient Instructions (Signed)
Medication Instructions:  ?Your Physician recommend you continue on your current medication as directed.   ? ?*If you need a refill on your cardiac medications before your next appointment, please call your pharmacy* ? ? ?Lab Work: ?None ordered today  ? ?Testing/Procedures: ?EKG looked good today!  ? ? ?Follow-Up: ?At [facility], you and your health needs are our priority.  As part of our continuing mission to provide you with exceptional heart care, we have created designated Provider Care Teams.  These Care Teams include your primary Cardiologist (physician) and Advanced Practice Providers (APPs -  Physician Assistants and Nurse Practitioners) who all work together to provide you with the care you need, when you need it. ? ?We recommend signing up for the patient portal called "MyChart".  Sign up information is provided on this After Visit Summary.  MyChart is used to connect with patients for Virtual Visits (Telemedicine).  Patients are able to view lab/test results, encounter notes, upcoming appointments, etc.  Non-urgent messages can be sent to your provider as well.   ?To learn more about what you can do with MyChart, go to [email or web address].   ? ?Your next appointment:   ?1 year(s) ? ?The format for your next appointment:   ?In Person ? ?Provider:  Dr. Gwyndolyn Kaufman ? ?Other Instructions ?Heart Healthy Diet Recommendations: ?A low-salt diet is recommended. Meats should be grilled, baked, or boiled. Avoid fried foods. Focus on lean protein sources like fish or chicken with vegetables and fruits. The American Heart Association is a Microbiologist!  American Heart Association Diet and Lifeystyle Recommendations  ? ?Exercise recommendations: ?The American Heart Association recommends 150 minutes of moderate intensity exercise weekly. ?Try 30 minutes of moderate intensity exercise 4-5 times per week. ?This could include walking, jogging, or swimming. ? ? ?Important Information About  Sugar ? ? ? ? ? ? ?

## 2021-09-01 ENCOUNTER — Other Ambulatory Visit: Payer: Self-pay | Admitting: Obstetrics & Gynecology

## 2021-09-09 NOTE — Progress Notes (Signed)
Surgical Instructions ? ? ? Your procedure is scheduled on Friday May 5th. ? Report to [facility] Main Entrance "A" at 7:45 A.M., then check in with the Admitting office. ? Call this number if you have problems the morning of surgery: ? [phone] ? ? If you have any questions prior to your surgery date call [phone]: Open Monday-Friday 8am-4pm ? ? ? Remember: ? Do not eat after midnight the night before your surgery ? ?You may drink clear liquids until 6:45 the morning of your surgery.   ?Clear liquids allowed are: Water, Non-Citrus Juices (without pulp), Carbonated Beverages, Clear Tea, Black Coffee ONLY (NO MILK, CREAM OR POWDERED CREAMER of any kind), and Gatorade ?  ? Take these medicines the morning of surgery with A SIP OF WATER: ?amLODipine (NORVASC) 5 MG tablet ?carvedilol (COREG) 12.5 MG tablet ?cetirizine (ZYRTEC) 10 MG tablet ?mycophenolate (MYFORTIC) 180 MG EC tablet ?tacrolimus (PROGRAF) 1 MG capsule ? ?IF NEEDED  ?acetaminophen (TYLENOL) 650 MG CR tablet ?ondansetron (ZOFRAN) 4 MG tablet ? ? ?Follow your surgeon's instructions on when to stop Aspirin.  If no instructions were given by your surgeon then you will need to call the office to get those instructions.   ? ? ?As of today, STOP taking any Aspirin (unless otherwise instructed by your surgeon) Aleve, Naproxen, Ibuprofen, Motrin, Advil, Goody's, BC's, all herbal medications, fish oil, and all vitamins. ? ?         ?Do not wear jewelry or makeup ?Do not wear lotions, powders, perfumes or deodorant. ?Do not shave 48 hours prior to surgery.   ?Do not bring valuables to the hospital. ?Do not wear nail polish, gel polish, artificial nails, or any other type of covering on natural nails (fingers and toes) ?If you have artificial nails or gel coating that need to be removed by a nail salon, please have this removed prior to surgery. Artificial nails or gel coating may interfere with anesthesia's ability to adequately monitor your vital  signs. ? ?[facility] is not responsible for any belongings or valuables. .  ? ?Do NOT Smoke (Tobacco/Vaping)  24 hours prior to your procedure ? ?If you use a CPAP at night, you may bring your mask for your overnight stay. ?  ?Contacts, glasses, hearing aids, dentures or partials may not be worn into surgery, please bring cases for these belongings ?  ?For patients admitted to the hospital, discharge time will be determined by your treatment team. ?  ?Patients discharged the day of surgery will not be allowed to drive home, and someone needs to stay with them for 24 hours. ? ? ?SURGICAL WAITING ROOM VISITATION ?Patients having surgery or a procedure in a hospital may have two support people. ?Children under the age of 28 must have an adult with them who is not the patient. ?They may stay in the waiting area during the procedure and may switch out with other visitors. If the patient needs to stay at the hospital during part of their recovery, the visitor guidelines for inpatient rooms apply. ? ?Please refer to the [facility] website for the visitor guidelines for Inpatients (after your surgery is over and you are in a regular room).  ? ? ? ? ? ?Special instructions:   ? ?Oral Hygiene is also important to reduce your risk of infection.  Remember - BRUSH YOUR TEETH THE MORNING OF SURGERY WITH YOUR REGULAR TOOTHPASTE ? ? ?[facility]- Preparing For Surgery ? ?Before surgery, you can play an important role. Because  skin is not sterile, your skin needs to be as free of germs as possible. You can reduce the number of germs on your skin by washing with CHG (chlorahexidine gluconate) Soap before surgery.  CHG is an antiseptic cleaner which kills germs and bonds with the skin to continue killing germs even after washing.   ? ? ?Please do not use if you have an allergy to CHG or antibacterial soaps. If your skin becomes reddened/irritated stop using the CHG.  ?Do not shave (including legs and underarms) for at least 48 hours  prior to first CHG shower. It is OK to shave your face. ? ?Please follow these instructions carefully. ?  ? ? Shower the NIGHT BEFORE SURGERY and the MORNING OF SURGERY with CHG Soap.  ? If you chose to wash your hair, wash your hair first as usual with your normal shampoo. After you shampoo, rinse your hair and body thoroughly to remove the shampoo.  Then [employer] and genitals (private parts) with your normal soap and rinse thoroughly to remove soap. ? ?After that Use CHG Soap as you would any other liquid soap. You can apply CHG directly to the skin and wash gently with a scrungie or a clean washcloth.  ? ?Apply the CHG Soap to your body ONLY FROM THE NECK DOWN.  Do not use on open wounds or open sores. Avoid contact with your eyes, ears, mouth and genitals (private parts). Wash Face and genitals (private parts)  with your normal soap.  ? ?Wash thoroughly, paying special attention to the area where your surgery will be performed. ? ?Thoroughly rinse your body with warm water from the neck down. ? ?DO NOT shower/wash with your normal soap after using and rinsing off the CHG Soap. ? ?Pat yourself dry with a CLEAN TOWEL. ? ?Wear CLEAN PAJAMAS to bed the night before surgery ? ?Place CLEAN SHEETS on your bed the night before your surgery ? ?DO NOT SLEEP WITH PETS. ? ? ?Day of Surgery: ? ?Take a shower with CHG soap. ?Wear Clean/Comfortable clothing the morning of surgery ?Do not apply any deodorants/lotions.   ?Remember to brush your teeth WITH YOUR REGULAR TOOTHPASTE. ? ? ? ?If you received a COVID test during your pre-op visit, it is requested that you wear a mask when out in public, stay away from anyone that may not be feeling well, and notify your surgeon if you develop symptoms. If you have been in contact with anyone that has tested positive in the last 10 days, please notify your surgeon. ? ?  ?Please read over the following fact sheets that you were given.  ? ?

## 2021-09-12 ENCOUNTER — Encounter (HOSPITAL_COMMUNITY)
Admission: RE | Admit: 2021-09-12 | Discharge: 2021-09-12 | Disposition: A | Discharge status: Home / Self Care | Payer: Medicare Other | Source: Ambulatory Visit | Attending: Obstetrics & Gynecology | Admitting: Obstetrics & Gynecology

## 2021-09-12 ENCOUNTER — Other Ambulatory Visit: Payer: Self-pay | Admitting: Obstetrics & Gynecology

## 2021-09-12 VITALS — BP 129/81 | HR 63 | Temp 97.5°F | Resp 19 | Ht 60.0 in | Wt 210.0 lb

## 2021-09-12 DIAGNOSIS — N83299 Other ovarian cyst, unspecified side: Secondary | ICD-10-CM

## 2021-09-13 ENCOUNTER — Other Ambulatory Visit: Payer: Self-pay | Admitting: Obstetrics & Gynecology

## 2021-09-13 ENCOUNTER — Ambulatory Visit (INDEPENDENT_AMBULATORY_CARE_PROVIDER_SITE_OTHER): Payer: Medicare Other | Admitting: Emergency Medicine

## 2021-09-13 VITALS — BP 124/68 | HR 67 | Temp 97.7°F | Ht 60.0 in | Wt 211.4 lb

## 2021-09-13 DIAGNOSIS — Z940 Kidney transplant status: Secondary | ICD-10-CM | POA: Diagnosis not present

## 2021-09-13 DIAGNOSIS — Z01818 Encounter for other preprocedural examination: Secondary | ICD-10-CM | POA: Diagnosis not present

## 2021-09-13 DIAGNOSIS — R7303 Prediabetes: Secondary | ICD-10-CM

## 2021-09-13 DIAGNOSIS — N1832 Chronic kidney disease, stage 3b: Secondary | ICD-10-CM | POA: Diagnosis not present

## 2021-09-13 DIAGNOSIS — I10 Essential (primary) hypertension: Secondary | ICD-10-CM

## 2021-09-13 DIAGNOSIS — I25118 Atherosclerotic heart disease of native coronary artery with other forms of angina pectoris: Secondary | ICD-10-CM

## 2021-09-13 DIAGNOSIS — E785 Hyperlipidemia, unspecified: Secondary | ICD-10-CM

## 2021-09-13 DIAGNOSIS — D849 Immunodeficiency, unspecified: Secondary | ICD-10-CM

## 2021-09-13 DIAGNOSIS — I700 Atherosclerosis of aorta: Secondary | ICD-10-CM

## 2021-09-13 NOTE — Progress Notes (Signed)
Megan Bradford ?71 y.o. ? ? ?Chief Complaint  ?Patient presents with  ? Follow-up  ?  No concerns  ? ? ?HISTORY OF PRESENT ILLNESS: ?This is a 71 y.o. female here for preop clearance.  Scheduled for D&C and bilateral oophorectomy. ?Surgery pushed to June.  Does not want to do blood work today. ?Patient has the following chronic medical problems: ?#1 ESRD status post renal transplant (2016) with CKD stage IIIb presently on tacrolimus ?#2 hypertension: On amlodipine 5 mg and carvedilol 12.5 mg twice a day ?#3 HLD on rosuvastatin 20 mg daily ?#4 history of palpitations with documented runs of nonsustained SVTs.  Presently on carvedilol 12.5 mg daily. ?#5 coronary artery calcifications, last cardiology visit on 09/28/2020 ?#6 obesity ?Recently seen by cardiologist on 08/30/2021 for preop clearance also.  Assessment and plan as follows: ?Assessment & Plan  ?  ?Preop clearance - Upcoming laparoscopic removal of ovary per patient reqport. According to the Revised Cardiac Risk Index (RCRI), her Perioperative Risk of Major Cardiac Event is (%): 0.9. Her Functional Capacity in METs is: 5.07 according to the [facility] Activity Status Index (DASI).  She is deemed a acceptable risk for the planned procedure without additional cardiovascular testing.  Given the procedure is laparoscopic ideally would continue aspirin throughout the perioperative period though could be held if deemed necessary by surgical team. Will route to [facility] so they are aware. ?  ?CAD -known coronary calcification by CT scan.  Myoview 06/2020 lower study with no evidence of ischemia. ?  ?ESRD s/p renal transplant 2016 with CKD IIIb -baseline creatinine 1.6-1.8.  3.7.  Follows with nephrology. Careful titration of diuretic and antihypertensive.  ?  ?HTN - BP well controlled. Continue current antihypertensive regimen.   ?  ?HLD -LDL goal less than 70.  07/2021 AST 12, ALT.lipid panel 06/2021 with LDL 72, triglycerides 51, HDL 68, total cholesterol 151.   Continue rosuvastatin 20 mg daily.  She prefers to remain on same dose and continue diet and lifestyle changes to get LDL from 72 to goal of less than 70. ?  ?DM2 - 11/2019 A1c 6.4. 07/06/2021 A1c 6.3. Continue to follow with PCP. Not presently on medical therapy.  ?  ?Disposition: Follow up in 1 year(s) with [name], MD or APP. ?  ?Signed, ?Megan Dubonnet, NP ?08/30/2021, 10:16 AM ?[facility] ? ? ?HPI ? ? ?Prior to Admission medications   ?Medication Sig Start Date End Date Taking? Authorizing Provider  ?acetaminophen (TYLENOL) 650 MG CR tablet Take 650 mg by mouth every 8 (eight) hours as needed for pain.   Yes [provider]  ?amLODipine (NORVASC) 5 MG tablet Take 5 mg by mouth daily.   Yes [provider]  ?aspirin EC 81 MG tablet Take 81 mg by mouth daily. Swallow whole.   Yes [provider]  ?carvedilol (COREG) 12.5 MG tablet Take 12.5 mg by mouth 2 (two) times daily with a meal.   Yes [provider]  ?cetirizine (ZYRTEC) 10 MG tablet Take 10 mg by mouth daily.   Yes [provider]  ?Cholecalciferol (VITAMIN D3) 10 MCG (400 UNIT) CAPS Take 400 Units by mouth daily.   Yes [provider]  ?Coenzyme Q10 (CO Q10 PO) Take 1 capsule by mouth daily.   Yes [provider]  ?cyclobenzaprine (FLEXERIL) 10 MG tablet Take 1 tablet (10 mg total) by mouth 3 (three) times daily as needed for muscle spasms. 12/02/20  Yes Duffy Rhody  G, MD  ?diclofenac Sodium (VOLTAREN) 1 % GEL Apply 2 g topically 4 (four) times daily. 12/01/19  Yes Wieters, Hallie C, [name]  ?lisinopril (ZESTRIL) 5 MG tablet Take 5 mg by mouth daily. 03/26/21  Yes [provider]  ?magnesium oxide (MAG-OX) 400 MG tablet Take 400 mg by mouth daily.   Yes [provider]  ?multivitamin-lutein (OCUVITE-LUTEIN) CAPS capsule Take 1 capsule by mouth daily.   Yes [provider]  ?mycophenolate (MYFORTIC) 180 MG EC tablet Take 180 mg  by mouth 2 (two) times daily.   Yes [provider]  ?ondansetron (ZOFRAN) 4 MG tablet Take 1 tablet (4 mg total) by mouth every 8 (eight) hours as needed for nausea or vomiting. 10/24/20  Yes [name], [name]  ?rosuvastatin (CRESTOR) 20 MG tablet Take 1 tablet (20 mg total) by mouth daily. 12/20/20  Yes [name], MD  ?tacrolimus (PROGRAF) 1 MG capsule Take 2-3 mg by mouth See admin instructions. Take 3 mg by mouth in the morning and 2 mg in the evening 06/14/20  Yes [provider]  ? ? ?Allergies  ?Allergen Reactions  ? Keflex [Cephalexin] Anaphylaxis  ? Ibuprofen   ?  Raises blood pressure; pt states this is not correct  ? Xanax [Alprazolam] Other (See Comments)  ?  pt states this is not correct ?  ? ? ?Patient Active Problem List  ? Diagnosis Date Noted  ? Dyssynergic defecation   ? Incontinence of feces with fecal urgency   ? 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/01/2020  ? Trigger finger, right middle finger 10/28/2020  ? Coronary artery disease of native artery of native heart with stable angina pectoris ([place]) 08/30/2020  ? Immunosuppression ([place]) 08/30/2020  ? Stage 3b chronic kidney disease ([place]) 12/17/2019  ? History of kidney transplant 11/27/2019  ? Essential hypertension 11/27/2019  ? Dyslipidemia 11/27/2019  ? Prediabetes 11/27/2019  ? Body mass index (BMI) of 38.0-38.9 in adult 11/27/2019  ? ? ?Past Medical History:  ?Diagnosis Date  ? Cataract   ? Phreesia 11/24/2019- removed both  ? Chronic kidney disease   ? Phreesia 11/24/2019  ? Clotting disorder ([place])   ? PE 2010 per pt there really was not a PE  ? Heart murmur   ? Hypertension   ? Phreesia 11/24/2019  ? Kidney transplanted   ? Pre-diabetes   ? no meds  ? Pulmonary embolism ([place]) 2010  ? ? ?Past Surgical History:  ?Procedure Laterality Date  ? ANAL RECTAL MANOMETRY N/A 08/03/2021  ? Procedure: ANO RECTAL MANOMETRY;  Surgeon: Thornton Park, MD;  Location: [facility];  Service: Gastroenterology;  Laterality:  N/A;  ? CARDIAC CATHETERIZATION    ? x2  ? CESAREAN SECTION N/A   ? Phreesia 11/24/2019  ? CHOLECYSTECTOMY N/A   ? Phreesia 11/24/2019  ? COLONOSCOPY    ? DILATION AND CURETTAGE OF UTERUS    ? EYE SURGERY N/A   ? Cataracts removed Phreesia 11/24/2019  ? HIP SURGERY Left 1990  ? Hip dysplasia cleaned out per patient  ? KIDNEY TRANSPLANT    ? POLYPECTOMY    ? HPP  ? TONSILLECTOMY    ? TRANSFORAMINAL LUMBAR INTERBODY FUSION (TLIF) WITH PEDICLE SCREW FIXATION 1 LEVEL N/A 12/01/2020  ? Procedure: Transiforaminal Lumbar Interbody Fusion Lumbar four--Lumbar five;  Surgeon: Vallarie Mare, MD;  Location: [facility];  Service: Neurosurgery;  Laterality: N/A;  ? TRIGGER FINGER RELEASE Right 10/28/2020  ? Long finger  ? ? ?Social History  ? ?  Socioeconomic History  ? Marital status: Widowed  ?  Spouse name: Not on file  ? Number of children: Not on file  ? Years of education: Not on file  ? Highest education level: Not on file  ?Occupational History  ? Not on file  ?Tobacco Use  ? Smoking status: Never  ? Smokeless tobacco: Never  ?Vaping Use  ? Vaping Use: Never used  ?Substance and Sexual Activity  ? Alcohol use: Never  ? Drug use: Never  ? Sexual activity: Not Currently  ?  Birth control/protection: None  ?Other Topics Concern  ? Not on file  ?Social History Narrative  ? Not on file  ? ?Social Determinants of Health  ? ?Financial Resource Strain: Low Risk   ? Difficulty of Paying Living Expenses: Not hard at all  ?Food Insecurity: No Food Insecurity  ? Worried About Charity fundraiser in the Last Year: Never true  ? Ran Out of Food in the Last Year: Never true  ?Transportation Needs: No Transportation Needs  ? Lack of Transportation (Medical): No  ? Lack of Transportation (Non-Medical): No  ?Physical Activity: Sufficiently Active  ? Days of Exercise per Week: 5 days  ? Minutes of Exercise per Session: 30 min  ?Stress: No Stress Concern Present  ? Feeling of Stress : Not at all  ?Social Connections: Moderately Integrated  ?  Frequency of Communication with Friends and Family: More than three times a week  ? Frequency of Social Gatherings with Friends and Family: More than three times a week  ? Attends Religious Services: Mor

## 2021-09-13 NOTE — Patient Instructions (Signed)
Health Maintenance After Age 70 After age 70, you are at a higher risk for certain long-term diseases and infections as well as injuries from falls. Falls are a major cause of broken bones and head injuries in people who are older than age 70. Getting regular preventive care can help to keep you healthy and well. Preventive care includes getting regular testing and making lifestyle changes as recommended by your health care provider. Talk with your health care provider about: Which screenings and tests you should have. A screening is a test that checks for a disease when you have no symptoms. A diet and exercise plan that is right for you. What should I know about screenings and tests to prevent falls? Screening and testing are the best ways to find a health problem early. Early diagnosis and treatment give you the best chance of managing medical conditions that are common after age 70. Certain conditions and lifestyle choices may make you more likely to have a fall. Your health care provider may recommend: Regular vision checks. Poor vision and conditions such as cataracts can make you more likely to have a fall. If you wear glasses, make sure to get your prescription updated if your vision changes. Medicine review. Work with your health care provider to regularly review all of the medicines you are taking, including over-the-counter medicines. Ask your health care provider about any side effects that may make you more likely to have a fall. Tell your health care provider if any medicines that you take make you feel dizzy or sleepy. Strength and balance checks. Your health care provider may recommend certain tests to check your strength and balance while standing, walking, or changing positions. Foot health exam. Foot pain and numbness, as well as not wearing proper footwear, can make you more likely to have a fall. Screenings, including: Osteoporosis screening. Osteoporosis is a condition that causes  the bones to get weaker and break more easily. Blood pressure screening. Blood pressure changes and medicines to control blood pressure can make you feel dizzy. Depression screening. You may be more likely to have a fall if you have a fear of falling, feel depressed, or feel unable to do activities that you used to do. Alcohol use screening. Using too much alcohol can affect your balance and may make you more likely to have a fall. Follow these instructions at home: Lifestyle Do not drink alcohol if: Your health care provider tells you not to drink. If you drink alcohol: Limit how much you have to: 0-1 drink a day for women. 0-2 drinks a day for men. Know how much alcohol is in your drink. In the [place] one drink equals one 12 oz bottle of beer (355 mL), one 5 oz glass of wine (148 mL), or one 1 oz glass of hard liquor (44 mL). Do not use any products that contain nicotine or tobacco. These products include cigarettes, chewing tobacco, and vaping devices, such as e-cigarettes. If you need help quitting, ask your health care provider. Activity  Follow a regular exercise program to stay fit. This will help you maintain your balance. Ask your health care provider what types of exercise are appropriate for you. If you need a cane or walker, use it as recommended by your health care provider. Wear supportive shoes that have nonskid soles. Safety  Remove any tripping hazards, such as rugs, cords, and clutter. Install safety equipment such as grab bars in bathrooms and safety rails on stairs. Keep rooms and walkways   well-lit. General instructions Talk with your health care provider about your risks for falling. Tell your health care provider if: You fall. Be sure to tell your health care provider about all falls, even ones that seem minor. You feel dizzy, tiredness (fatigue), or off-balance. Take over-the-counter and prescription medicines only as told by your health care provider. These include  supplements. Eat a healthy diet and maintain a healthy weight. A healthy diet includes low-fat dairy products, low-fat (lean) meats, and fiber from whole grains, beans, and lots of fruits and vegetables. Stay current with your vaccines. Schedule regular health, dental, and eye exams. Summary Having a healthy lifestyle and getting preventive care can help to protect your health and wellness after age 70. Screening and testing are the best way to find a health problem early and help you avoid having a fall. Early diagnosis and treatment give you the best chance for managing medical conditions that are more common for people who are older than age 70. Falls are a major cause of broken bones and head injuries in people who are older than age 70. Take precautions to prevent a fall at home. Work with your health care provider to learn what changes you can make to improve your health and wellness and to prevent falls. This information is not intended to replace advice given to you by your health care provider. Make sure you discuss any questions you have with your health care provider. Document Revised: 09/20/2020 Document Reviewed: 09/20/2020 Elsevier Patient Education  2023 [employer]  

## 2021-09-14 ENCOUNTER — Encounter: Payer: Self-pay | Admitting: Emergency Medicine

## 2021-09-14 NOTE — Assessment & Plan Note (Signed)
Well-controlled hypertension. ?BP Readings from Last 3 Encounters:  ?09/13/21 124/68  ?09/12/21 129/81  ?08/30/21 110/72  ?Continue amlodipine 5 mg and carvedilol 12.5 mg twice a day. ? ?

## 2021-09-14 NOTE — Assessment & Plan Note (Signed)
Stable.  Diet and nutrition discussed. 

## 2021-09-14 NOTE — Assessment & Plan Note (Signed)
Stable.  No anginal episodes. 

## 2021-09-14 NOTE — Assessment & Plan Note (Signed)
Stable.  Continue rosuvastatin 20 mg daily. ?

## 2021-09-14 NOTE — Assessment & Plan Note (Signed)
Stable renal function.  Advised to stay well-hydrated and avoid NSAIDs. ?

## 2021-10-13 ENCOUNTER — Observation Stay (HOSPITAL_COMMUNITY)
Admission: EM | Admit: 2021-10-13 | Discharge: 2021-10-14 | Disposition: A | Discharge status: Home / Self Care | Payer: Medicare Other | Attending: Internal Medicine | Admitting: Internal Medicine

## 2021-10-13 ENCOUNTER — Other Ambulatory Visit: Payer: Self-pay

## 2021-10-13 ENCOUNTER — Encounter (HOSPITAL_COMMUNITY): Payer: Self-pay

## 2021-10-13 ENCOUNTER — Emergency Department (HOSPITAL_COMMUNITY): Payer: Medicare Other

## 2021-10-13 DIAGNOSIS — E782 Mixed hyperlipidemia: Secondary | ICD-10-CM | POA: Diagnosis present

## 2021-10-13 DIAGNOSIS — Z6838 Body mass index (BMI) 38.0-38.9, adult: Secondary | ICD-10-CM

## 2021-10-13 DIAGNOSIS — I132 Hypertensive heart and chronic kidney disease with heart failure and with stage 5 chronic kidney disease, or end stage renal disease: Secondary | ICD-10-CM | POA: Diagnosis not present

## 2021-10-13 DIAGNOSIS — Z20822 Contact with and (suspected) exposure to covid-19: Secondary | ICD-10-CM | POA: Insufficient documentation

## 2021-10-13 DIAGNOSIS — Z86711 Personal history of pulmonary embolism: Secondary | ICD-10-CM | POA: Insufficient documentation

## 2021-10-13 DIAGNOSIS — N186 End stage renal disease: Secondary | ICD-10-CM | POA: Insufficient documentation

## 2021-10-13 DIAGNOSIS — M4316 Spondylolisthesis, lumbar region: Secondary | ICD-10-CM

## 2021-10-13 DIAGNOSIS — R0602 Shortness of breath: Secondary | ICD-10-CM | POA: Insufficient documentation

## 2021-10-13 DIAGNOSIS — I2510 Atherosclerotic heart disease of native coronary artery without angina pectoris: Secondary | ICD-10-CM | POA: Diagnosis not present

## 2021-10-13 DIAGNOSIS — Z79899 Other long term (current) drug therapy: Secondary | ICD-10-CM | POA: Diagnosis not present

## 2021-10-13 DIAGNOSIS — Z940 Kidney transplant status: Secondary | ICD-10-CM

## 2021-10-13 DIAGNOSIS — N1832 Chronic kidney disease, stage 3b: Secondary | ICD-10-CM | POA: Diagnosis present

## 2021-10-13 DIAGNOSIS — R197 Diarrhea, unspecified: Secondary | ICD-10-CM | POA: Diagnosis present

## 2021-10-13 DIAGNOSIS — Z7982 Long term (current) use of aspirin: Secondary | ICD-10-CM | POA: Diagnosis not present

## 2021-10-13 DIAGNOSIS — I10 Essential (primary) hypertension: Secondary | ICD-10-CM | POA: Diagnosis present

## 2021-10-13 DIAGNOSIS — R7303 Prediabetes: Secondary | ICD-10-CM

## 2021-10-13 DIAGNOSIS — R079 Chest pain, unspecified: Secondary | ICD-10-CM | POA: Diagnosis present

## 2021-10-13 DIAGNOSIS — I5032 Chronic diastolic (congestive) heart failure: Secondary | ICD-10-CM | POA: Diagnosis not present

## 2021-10-13 DIAGNOSIS — I25119 Atherosclerotic heart disease of native coronary artery with unspecified angina pectoris: Secondary | ICD-10-CM

## 2021-10-13 DIAGNOSIS — E785 Hyperlipidemia, unspecified: Secondary | ICD-10-CM | POA: Diagnosis present

## 2021-10-13 DIAGNOSIS — R0781 Pleurodynia: Secondary | ICD-10-CM

## 2021-10-13 LAB — CBC
HCT: 42.7 % (ref 36.0–46.0)
Hemoglobin: 13.8 g/dL (ref 12.0–15.0)
MCH: 29.1 pg (ref 26.0–34.0)
MCHC: 32.3 g/dL (ref 30.0–36.0)
MCV: 90.1 fL (ref 80.0–100.0)
Platelets: 224 K/uL (ref 150–400)
RBC: 4.74 MIL/uL (ref 3.87–5.11)
RDW: 14.7 % (ref 11.5–15.5)
WBC: 6.3 K/uL (ref 4.0–10.5)
nRBC: 0 % (ref 0.0–0.2)

## 2021-10-13 LAB — TROPONIN I (HIGH SENSITIVITY)
Troponin I (High Sensitivity): 4 ng/L (ref <18)
Troponin I (High Sensitivity): 4 ng/L (ref <18)

## 2021-10-13 LAB — BASIC METABOLIC PANEL WITH GFR
Anion gap: 6 (ref 5–15)
BUN: 22 mg/dL (ref 8–23)
CO2: 26 mmol/L (ref 22–32)
Calcium: 9.3 mg/dL (ref 8.9–10.3)
Chloride: 107 mmol/L (ref 98–111)
Creatinine, Ser: 1.87 mg/dL — ABNORMAL HIGH (ref 0.44–1.00)
GFR, Estimated: 29 mL/min — ABNORMAL LOW
Glucose, Bld: 127 mg/dL — ABNORMAL HIGH (ref 70–99)
Potassium: 4.8 mmol/L (ref 3.5–5.1)
Sodium: 139 mmol/L (ref 135–145)

## 2021-10-13 LAB — D-DIMER, QUANTITATIVE: D-Dimer, Quant: 0.64 ug/mL-FEU — ABNORMAL HIGH (ref 0.00–0.50)

## 2021-10-13 LAB — RESP PANEL BY RT-PCR (FLU A&B, COVID) ARPGX2
Influenza A by PCR: NEGATIVE
Influenza B by PCR: NEGATIVE
SARS Coronavirus 2 by RT PCR: NEGATIVE

## 2021-10-13 MED ORDER — SODIUM CHLORIDE 0.9 % IV BOLUS
1000.0000 mL | Freq: Once | INTRAVENOUS | Status: AC
  Administered 2021-10-13: 1000 mL via INTRAVENOUS

## 2021-10-13 NOTE — ED Triage Notes (Addendum)
Complains of chest pain, hard to breathing, pain in lungs and ear pain x a couple days.  Denies fever. +cough denies sputum production. States " I just feel blah" Patient is a kidney transplant patient on tacrolimus.

## 2021-10-13 NOTE — ED Provider Notes (Signed)
[facility] Provider Note   CSN: [id number] Arrival date & time: 10/13/21  1646     History  Chief Complaint  Patient presents with   Chest Pain    Megan Bradford is a 71 y.o. female with a history of HTN, CAD, prior kidney transplant on tacrolimus, and pulmonary embolism (in 2010, not currently on anticoagulation) presenting to the ED with shortness of breath and chest pain.  Patient states for the past few days, she has had shortness of breath that is both at rest and worsens with exertion.  She also endorses a "aching" chest pain that worsens with deep breathing over her chest.  She has had a cough and some mild congestion over the past few days.  Cough is nonproductive.  No associated fevers, abdominal pain, nausea/vomiting.  She has had a decreased appetite today.   Chest Pain Associated symptoms: cough and shortness of breath   Associated symptoms: no abdominal pain, no fever, no nausea, no palpitations and no vomiting       Home Medications Prior to Admission medications   Medication Sig Start Date End Date Taking? Authorizing Provider  acetaminophen (TYLENOL) 650 MG CR tablet Take 650 mg by mouth every 8 (eight) hours as needed for pain.   Yes [provider]  amLODipine (NORVASC) 5 MG tablet Take 5 mg by mouth daily.   Yes [provider]  Apoaequorin (PREVAGEN) 10 MG CAPS Take 10 mg by mouth daily.   Yes [provider]  aspirin EC 81 MG tablet Take 81 mg by mouth daily. Swallow whole.   Yes [provider]  carvedilol (COREG) 12.5 MG tablet Take 12.5 mg by mouth 2 (two) times daily with a meal.   Yes [provider]  cetirizine (ZYRTEC) 10 MG tablet Take 10 mg by mouth daily.   Yes [provider]  Cholecalciferol (VITAMIN D3) 10 MCG (400 UNIT) CAPS Take 400 Units by mouth daily.   Yes [provider]  Coenzyme Q10 (CO Q10 PO) Take 1 capsule by mouth daily.   Yes  [provider]  diclofenac Sodium (VOLTAREN) 1 % GEL Apply 2 g topically 4 (four) times daily. Patient taking differently: Apply 2 g topically daily as needed (pain). 12/01/19  Yes Wieters, Hallie C, [name]  lisinopril (ZESTRIL) 5 MG tablet Take 5 mg by mouth daily. 03/26/21  Yes [provider]  magnesium oxide (MAG-OX) 400 MG tablet Take 400 mg by mouth daily.   Yes [provider]  multivitamin-lutein (OCUVITE-LUTEIN) CAPS capsule Take 1 capsule by mouth daily.   Yes [provider]  mycophenolate (MYFORTIC) 180 MG EC tablet Take 180 mg by mouth 2 (two) times daily.   Yes [provider]  ondansetron (ZOFRAN) 4 MG tablet Take 1 tablet (4 mg total) by mouth every 8 (eight) hours as needed for nausea or vomiting. 10/24/20  Yes [name], [name]  rosuvastatin (CRESTOR) 20 MG tablet Take 1 tablet (20 mg total) by mouth daily. 12/20/20  Yes [name], MD  tacrolimus (PROGRAF) 1 MG capsule Take 2-3 mg by mouth See admin instructions. Take 3 mg by mouth in the morning and 2 mg in the evening 06/14/20  Yes [provider]  cyclobenzaprine (FLEXERIL) 10 MG tablet Take 1 tablet (10 mg total) by mouth 3 (three) times daily as needed for muscle spasms. Patient not taking: Reported on 10/13/2021 12/02/20   Vallarie Mare, MD  Allergies    Keflex [cephalexin], Ibuprofen, and Xanax [alprazolam]    Review of Systems   Review of Systems  Constitutional:  Negative for fever.  HENT:  Positive for congestion.   Respiratory:  Positive for cough and shortness of breath.   Cardiovascular:  Positive for chest pain. Negative for palpitations and leg swelling.  Gastrointestinal:  Negative for abdominal pain, diarrhea, nausea and vomiting.  Neurological:  Negative for syncope and light-headedness.   Physical Exam Updated Vital Signs BP 120/74   Pulse 68   Temp 98.7 F (37.1 C) (Oral)   Resp 20   Ht 5' (1.524 m)   Wt 95.7 kg   SpO2 100%    BMI 41.21 kg/m  Physical Exam Constitutional:      General: She is not in acute distress.    Appearance: She is obese. She is not toxic-appearing or diaphoretic.  HENT:     Head: Normocephalic and atraumatic.     Nose: Nose normal.  Eyes:     General: No scleral icterus. Cardiovascular:     Rate and Rhythm: Normal rate and regular rhythm.     Pulses:          Radial pulses are 2+ on the right side and 2+ on the left side.     Heart sounds: Normal heart sounds. No murmur heard.   No friction rub. No gallop.  Pulmonary:     Effort: Pulmonary effort is normal. No tachypnea, accessory muscle usage or respiratory distress.     Breath sounds: Normal breath sounds. No stridor. No decreased breath sounds, wheezing, rhonchi or rales.  Chest:     Chest wall: No tenderness.  Abdominal:     Palpations: Abdomen is soft.     Tenderness: There is no abdominal tenderness. There is no guarding or rebound.  Musculoskeletal:     Cervical back: Neck supple.     Right lower leg: No tenderness. No edema.     Left lower leg: No tenderness. No edema.  Skin:    General: Skin is warm and dry.  Neurological:     General: No focal deficit present.     Mental Status: She is alert and oriented to person, place, and time.    ED Results / Procedures / Treatments   Labs (all labs ordered are listed, but only abnormal results are displayed) Labs Reviewed  BASIC METABOLIC PANEL - Abnormal; Notable for the following components:      Result Value   Glucose, Bld 127 (*)    Creatinine, Ser 1.87 (*)    GFR, Estimated 29 (*)    All other components within normal limits  D-DIMER, QUANTITATIVE - Abnormal; Notable for the following components:   D-Dimer, Quant 0.64 (*)    All other components within normal limits  RESP PANEL BY RT-PCR (FLU A&B, COVID) ARPGX2  CBC  TROPONIN I (HIGH SENSITIVITY)  TROPONIN I (HIGH SENSITIVITY)    EKG EKG Interpretation  Date/Time:  Thursday October 13 2021 17:24:11  EDT Ventricular Rate:  62 PR Interval:  162 QRS Duration: 88 QT Interval:  398 QTC Calculation: 403 R Axis:   127 Text Interpretation: Normal sinus rhythm Right axis deviation Abnormal ECG When compared with ECG of 18-Mar-2020 15:18, No significant change since last tracing Confirmed by Wandra Arthurs [phone]) on 10/13/2021 6:42:01 PM  Radiology DG Chest 1 View  Result Date: 10/13/2021 CLINICAL DATA:  Chest pain, short of breath, otalgia EXAM: CHEST  1 VIEW COMPARISON:  03/18/2020  FINDINGS: Single frontal view of the chest demonstrates a stable cardiac silhouette. No change in thoracic aortic ectasia and atherosclerosis. No airspace disease, effusion, or pneumothorax. No acute bony abnormalities. IMPRESSION: 1. Stable chest, no acute process. Electronically Signed   By: Randa Ngo M.D.   On: 10/13/2021 18:04    Procedures Procedures    Medications Ordered in ED Medications  sodium chloride 0.9 % bolus 1,000 mL (1,000 mLs Intravenous New Bag/Given 10/13/21 1923)    ED Course/ Medical Decision Making/ A&P                           Medical Decision Making Amount and/or Complexity of Data Reviewed Labs: ordered.  Risk Decision regarding hospitalization.   Megan Bradford is a 71 y.o. female with a history of HTN, CAD, prior kidney transplant on tacrolimus, and pulmonary embolism (in 2010, not currently on anticoagulation) presenting to the ED with shortness of breath and chest pain.  On exam, the patient is afebrile and hemodynamically stable.  Her lungs are clear to auscultation in all fields and she has a normal cardiac exam.  Abdomen is soft and nontender to palpation.  She has no lower extremity edema.  Her chest pain is pleuritic in nature and patient does have a history of prior pulmonary embolism about 10 years ago.  She is not currently on anticoagulation.  While she is not hypoxic or tachycardic, patient does take carvedilol so this may mask tachycardia.  Therefore, I am  concerned for possible pulmonary embolism.  However, patient does endorse a nonproductive cough and some congestion, so other differentials include viral illness versus pneumonia.  Patient does also have a history of CAD, so ACS also in the differential.  I discussed my concerns for possible pulmonary embolism with the patient and initially recommended a CTA of the chest.  Patient does have a transplanted kidney, and I discussed with her the risk benefits of obtaining a CTA.  Given her kidney transplant, patient declines a contrasted scan.  We will obtain a D-dimer, and if elevated will plan to admit the patient for VQ scan.  D-dimer is elevated to 0.64 so will admit patient for VQ scan and further observation which the patient is amenable to.  Medicine was contacted for admission and the patient was admitted to their service in stable condition.        Final Clinical Impression(s) / ED Diagnoses Final diagnoses:  Chest pain, unspecified type  Shortness of breath    Rx / DC Orders ED Discharge Orders     None         Sondra Come, MD 10/13/21 2329    Drenda Freeze, MD 10/19/21 [phone]

## 2021-10-13 NOTE — ED Provider Triage Note (Signed)
Emergency Medicine Provider Triage Evaluation Note  Megan Bradford , a 71 y.o. female  was evaluated in triage.  Pt complains of chest pain and shortness of breath for "the last few days".  Patient denies worsening of chest pain with exertion.  Patient states she is a history of blood clots but is not anticoagulated.  Patient states chest pain is located centrally, does not radiate.  Patient denies any recent fevers, nausea, vomiting.  Review of Systems  Positive:  Negative:   Physical Exam  BP 139/75 (BP Location: Right Arm)   Pulse 69   Temp 98.7 F (37.1 C) (Oral)   Resp 18   Ht 5' (1.524 m)   Wt 95.7 kg   SpO2 100%   BMI 41.21 kg/m  Gen:   Awake, no distress   Resp:  Normal effort  MSK:   Moves extremities without difficulty  Other:  Lung sounds clear bilaterally  Medical Decision Making  Medically screening exam initiated at 5:20 PM.  Appropriate orders placed.  Megan Bradford was informed that the remainder of the evaluation will be completed by another provider, this initial triage assessment does not replace that evaluation, and the importance of remaining in the ED until their evaluation is complete.     Azucena Cecil, [name] 10/13/21 1720

## 2021-10-14 ENCOUNTER — Observation Stay (HOSPITAL_COMMUNITY): Payer: Medicare Other

## 2021-10-14 ENCOUNTER — Encounter (HOSPITAL_COMMUNITY): Payer: Self-pay | Admitting: Internal Medicine

## 2021-10-14 ENCOUNTER — Observation Stay (HOSPITAL_BASED_OUTPATIENT_CLINIC_OR_DEPARTMENT_OTHER): Payer: Medicare Other

## 2021-10-14 DIAGNOSIS — E785 Hyperlipidemia, unspecified: Secondary | ICD-10-CM | POA: Diagnosis present

## 2021-10-14 DIAGNOSIS — N1832 Chronic kidney disease, stage 3b: Secondary | ICD-10-CM | POA: Diagnosis not present

## 2021-10-14 DIAGNOSIS — I10 Essential (primary) hypertension: Secondary | ICD-10-CM

## 2021-10-14 DIAGNOSIS — R079 Chest pain, unspecified: Secondary | ICD-10-CM

## 2021-10-14 DIAGNOSIS — R0781 Pleurodynia: Secondary | ICD-10-CM

## 2021-10-14 DIAGNOSIS — Z940 Kidney transplant status: Secondary | ICD-10-CM

## 2021-10-14 DIAGNOSIS — R197 Diarrhea, unspecified: Secondary | ICD-10-CM | POA: Diagnosis present

## 2021-10-14 DIAGNOSIS — A09 Infectious gastroenteritis and colitis, unspecified: Secondary | ICD-10-CM

## 2021-10-14 DIAGNOSIS — I25119 Atherosclerotic heart disease of native coronary artery with unspecified angina pectoris: Secondary | ICD-10-CM

## 2021-10-14 DIAGNOSIS — I5032 Chronic diastolic (congestive) heart failure: Secondary | ICD-10-CM | POA: Diagnosis not present

## 2021-10-14 DIAGNOSIS — E782 Mixed hyperlipidemia: Secondary | ICD-10-CM

## 2021-10-14 LAB — CBC WITH DIFFERENTIAL/PLATELET
Abs Immature Granulocytes: 0.02 10*3/uL (ref 0.00–0.07)
Basophils Absolute: 0.1 10*3/uL (ref 0.0–0.1)
Basophils Relative: 1 %
Eosinophils Absolute: 0.2 10*3/uL (ref 0.0–0.5)
Eosinophils Relative: 2 %
HCT: 41.1 % (ref 36.0–46.0)
Hemoglobin: 13.4 g/dL (ref 12.0–15.0)
Immature Granulocytes: 0 %
Lymphocytes Relative: 23 %
Lymphs Abs: 1.5 10*3/uL (ref 0.7–4.0)
MCH: 29.8 pg (ref 26.0–34.0)
MCHC: 32.6 g/dL (ref 30.0–36.0)
MCV: 91.3 fL (ref 80.0–100.0)
Monocytes Absolute: 0.8 10*3/uL (ref 0.1–1.0)
Monocytes Relative: 13 %
Neutro Abs: 4 10*3/uL (ref 1.7–7.7)
Neutrophils Relative %: 61 %
Platelets: 207 10*3/uL (ref 150–400)
RBC: 4.5 MIL/uL (ref 3.87–5.11)
RDW: 14.7 % (ref 11.5–15.5)
WBC: 6.5 10*3/uL (ref 4.0–10.5)
nRBC: 0 % (ref 0.0–0.2)

## 2021-10-14 LAB — LIPID PANEL
Cholesterol: 143 mg/dL (ref 0–200)
HDL: 56 mg/dL (ref >40)
LDL Cholesterol: 74 mg/dL (ref 0–99)
Total CHOL/HDL Ratio: 2.6 RATIO
Triglycerides: 64 mg/dL (ref <150)
VLDL: 13 mg/dL (ref 0–40)

## 2021-10-14 LAB — ECHOCARDIOGRAM COMPLETE
Area-P 1/2: 3.39 cm2
Height: 60 in
S' Lateral: 2.2 cm
Weight: 3376 oz

## 2021-10-14 LAB — COMPREHENSIVE METABOLIC PANEL
ALT: 9 U/L (ref 0–44)
AST: 17 U/L (ref 15–41)
Albumin: 2.9 g/dL — ABNORMAL LOW (ref 3.5–5.0)
Alkaline Phosphatase: 45 U/L (ref 38–126)
Anion gap: 8 (ref 5–15)
BUN: 23 mg/dL (ref 8–23)
CO2: 21 mmol/L — ABNORMAL LOW (ref 22–32)
Calcium: 8.7 mg/dL — ABNORMAL LOW (ref 8.9–10.3)
Chloride: 111 mmol/L (ref 98–111)
Creatinine, Ser: 1.83 mg/dL — ABNORMAL HIGH (ref 0.44–1.00)
GFR, Estimated: 30 mL/min — ABNORMAL LOW (ref >60)
Glucose, Bld: 183 mg/dL — ABNORMAL HIGH (ref 70–99)
Potassium: 4 mmol/L (ref 3.5–5.1)
Sodium: 140 mmol/L (ref 135–145)
Total Bilirubin: 0.8 mg/dL (ref 0.3–1.2)
Total Protein: 5.6 g/dL — ABNORMAL LOW (ref 6.5–8.1)

## 2021-10-14 LAB — TROPONIN I (HIGH SENSITIVITY): Troponin I (High Sensitivity): 4 ng/L (ref <18)

## 2021-10-14 LAB — HEMOGLOBIN A1C
Hgb A1c MFr Bld: 6.5 % — ABNORMAL HIGH (ref 4.8–5.6)
Mean Plasma Glucose: 139.85 mg/dL

## 2021-10-14 LAB — MAGNESIUM: Magnesium: 1.9 mg/dL (ref 1.7–2.4)

## 2021-10-14 LAB — HIV ANTIBODY (ROUTINE TESTING W REFLEX): HIV Screen 4th Generation wRfx: NONREACTIVE

## 2021-10-14 MED ORDER — HYDRALAZINE HCL 20 MG/ML IJ SOLN: 10.0000 mg | Freq: Four times a day (QID) | INTRAMUSCULAR | Status: DC | PRN

## 2021-10-14 MED ORDER — ACETAMINOPHEN 325 MG PO TABS: 650.0000 mg | ORAL_TABLET | Freq: Four times a day (QID) | ORAL | Status: DC | PRN

## 2021-10-14 MED ORDER — TACROLIMUS 1 MG PO CAPS: 2.0000 mg | ORAL_CAPSULE | ORAL | Status: DC

## 2021-10-14 MED ORDER — MYCOPHENOLATE SODIUM 180 MG PO TBEC
180.0000 mg | DELAYED_RELEASE_TABLET | Freq: Two times a day (BID) | ORAL | Status: DC
  Administered 2021-10-14: 180 mg via ORAL
  Filled 2021-10-14 (×2): qty 1

## 2021-10-14 MED ORDER — CARVEDILOL 3.125 MG PO TABS
12.5000 mg | ORAL_TABLET | Freq: Two times a day (BID) | ORAL | Status: DC
  Administered 2021-10-14: 12.5 mg via ORAL
  Filled 2021-10-14: qty 4

## 2021-10-14 MED ORDER — ACETAMINOPHEN 650 MG RE SUPP: 650.0000 mg | Freq: Four times a day (QID) | RECTAL | Status: DC | PRN

## 2021-10-14 MED ORDER — ONDANSETRON HCL 4 MG/2ML IJ SOLN: 4.0000 mg | Freq: Four times a day (QID) | INTRAMUSCULAR | Status: DC | PRN

## 2021-10-14 MED ORDER — ONDANSETRON HCL 4 MG PO TABS: 4.0000 mg | ORAL_TABLET | Freq: Four times a day (QID) | ORAL | Status: DC | PRN

## 2021-10-14 MED ORDER — ROSUVASTATIN CALCIUM 20 MG PO TABS
20.0000 mg | ORAL_TABLET | Freq: Every day | ORAL | Status: DC
  Filled 2021-10-14: qty 1

## 2021-10-14 MED ORDER — TACROLIMUS 1 MG PO CAPS
3.0000 mg | ORAL_CAPSULE | Freq: Every day | ORAL | Status: DC
  Administered 2021-10-14: 3 mg via ORAL
  Filled 2021-10-14: qty 3

## 2021-10-14 MED ORDER — TACROLIMUS 1 MG PO CAPS: 2.0000 mg | ORAL_CAPSULE | Freq: Every day | ORAL | Status: DC

## 2021-10-14 MED ORDER — AMLODIPINE BESYLATE 5 MG PO TABS
5.0000 mg | ORAL_TABLET | Freq: Every day | ORAL | Status: DC
  Administered 2021-10-14: 5 mg via ORAL
  Filled 2021-10-14: qty 1

## 2021-10-14 MED ORDER — ASPIRIN 81 MG PO TBEC
81.0000 mg | DELAYED_RELEASE_TABLET | Freq: Every day | ORAL | Status: DC
  Filled 2021-10-14: qty 1

## 2021-10-14 MED ORDER — LISINOPRIL 10 MG PO TABS
5.0000 mg | ORAL_TABLET | Freq: Every day | ORAL | Status: DC
  Filled 2021-10-14: qty 1

## 2021-10-14 MED ORDER — ENOXAPARIN SODIUM 30 MG/0.3ML IJ SOSY: 30.0000 mg | PREFILLED_SYRINGE | INTRAMUSCULAR | Status: DC

## 2021-10-14 MED ORDER — POLYETHYLENE GLYCOL 3350 17 G PO PACK: 17.0000 g | PACK | Freq: Every day | ORAL | Status: DC | PRN

## 2021-10-14 MED ORDER — LORATADINE 10 MG PO TABS
10.0000 mg | ORAL_TABLET | Freq: Every day | ORAL | Status: DC
  Filled 2021-10-14: qty 1

## 2021-10-14 NOTE — H&P (Signed)
History and Physical    Patient: Megan Bradford MRN: [id number] [place]: 10/13/2021  Date of Service: the patient was seen and examined on 10/14/2021  Patient coming from: Home  Chief Complaint:  Chief Complaint  Patient presents with   Chest Pain    HPI:   71 year old female with past medical history of end-stage renal disease status post renal transplant in 2016, now with CKD 3B  (baseline Cr now 1.9-1.4), diastolic congestive heart failure (Echo 03/2020 EF 65-70% with G1DD), coronary artery disease (based on noninvasive testing, last nuclear 06/2020 with no reversible ischemia), hypertension and hyperlipidemia who presents to [facility] emergency department with complaints of chest pain and shortness of breath.  Patient explains that for the past several days she has been experiencing mild shortness of breath.  Shortness of breath is seemingly worse with exertion and improved with rest.  Shortness of breath seems to be progressively worsening over the past several days.  Patient has also been experiencing chest discomfort.  Patient describes this chest discomfort is aching in quality, midsternal and nonradiating.  Chest discomfort seems to worsen with deep inspiration.  Patient also complains of associated nonproductive cough.  Patient denies any sick contacts, fevers or leg swelling over the span of time.  On 6/1, the patient noticed that she began to experience several episodes of watery nonbloody diarrhea.  Due to the patient's constellation of symptoms including shortness of breath, malaise and intermittent chest discomfort she eventually presented to [facility] emergency department for evaluation.  Upon evaluation in the emergency department initial troponin was found to be unremarkable.  Patient was found to have a slightly elevated D-dimer of 0.64 however patient did not proceed with CT angiogram of the chest due to renal function and known history of renal transplant.  ER  provider now requesting hospitalization for continued work-up.  The hospitalist group is now been called to assess the patient for admission the hospital.  Review of Systems: Review of Systems  Respiratory:  Positive for shortness of breath.   Cardiovascular:  Positive for chest pain.  Gastrointestinal:  Positive for diarrhea.  All other systems reviewed and are negative.   Past Medical History:  Diagnosis Date   Cataract    Phreesia 11/24/2019- removed both   Chronic kidney disease    Phreesia 11/24/2019   Clotting disorder ([place])    PE 2010 per pt there really was not a PE   Heart murmur    Hypertension    Phreesia 11/24/2019   Kidney transplanted    Pre-diabetes    no meds   Pulmonary embolism ([place]) 2010    Past Surgical History:  Procedure Laterality Date   ANAL RECTAL MANOMETRY N/A 08/03/2021   Procedure: ANO RECTAL MANOMETRY;  Surgeon: Thornton Park, MD;  Location: [facility];  Service: Gastroenterology;  Laterality: N/A;   CARDIAC CATHETERIZATION     x2   CESAREAN SECTION N/A    Phreesia 11/24/2019   CHOLECYSTECTOMY N/A    Phreesia 11/24/2019   COLONOSCOPY     DILATION AND CURETTAGE OF UTERUS     EYE SURGERY N/A    Cataracts removed [employer]reesia 11/24/2019   HIP SURGERY Left 1990   Hip dysplasia cleaned out per patient   KIDNEY TRANSPLANT     POLYPECTOMY     HPP   TONSILLECTOMY     TRANSFORAMINAL LUMBAR INTERBODY FUSION (TLIF) WITH PEDICLE SCREW FIXATION 1 LEVEL N/A 12/01/2020   Procedure: Transiforaminal Lumbar Interbody Fusion Lumbar four--Lumbar five;  Surgeon: Vallarie Mare, MD;  Location: [facility];  Service: Neurosurgery;  Laterality: N/A;   TRIGGER FINGER RELEASE Right 10/28/2020   Long finger    Social History:  reports that she has never smoked. She has never used smokeless tobacco. She reports that she does not drink alcohol and does not use drugs.  Allergies  Allergen Reactions   Keflex [Cephalexin] Anaphylaxis   Ibuprofen     Raises blood  pressure; pt states this is not correct   Xanax [Alprazolam] Other (See Comments)    pt states this is not correct     Family History  Problem Relation Age of Onset   Diabetes Mother    Asthma Mother    Hypertension Father    Coronary artery disease Father    Colon cancer Neg Hx    Colon polyps Neg Hx    Esophageal cancer Neg Hx    Rectal cancer Neg Hx    Stomach cancer Neg Hx     Prior to Admission medications   Medication Sig Start Date End Date Taking? Authorizing Provider  acetaminophen (TYLENOL) 650 MG CR tablet Take 650 mg by mouth every 8 (eight) hours as needed for pain.   Yes [provider]  amLODipine (NORVASC) 5 MG tablet Take 5 mg by mouth daily.   Yes [provider]  Apoaequorin (PREVAGEN) 10 MG CAPS Take 10 mg by mouth daily.   Yes [provider]  aspirin EC 81 MG tablet Take 81 mg by mouth daily. Swallow whole.   Yes [provider]  carvedilol (COREG) 12.5 MG tablet Take 12.5 mg by mouth 2 (two) times daily with a meal.   Yes [provider]  cetirizine (ZYRTEC) 10 MG tablet Take 10 mg by mouth daily.   Yes [provider]  Cholecalciferol (VITAMIN D3) 10 MCG (400 UNIT) CAPS Take 400 Units by mouth daily.   Yes [provider]  Coenzyme Q10 (CO Q10 PO) Take 1 capsule by mouth daily.   Yes [provider]  diclofenac Sodium (VOLTAREN) 1 % GEL Apply 2 g topically 4 (four) times daily. Patient taking differently: Apply 2 g topically daily as needed (pain). 12/01/19  Yes Wieters, Hallie C, [name]  lisinopril (ZESTRIL) 5 MG tablet Take 5 mg by mouth daily. 03/26/21  Yes [provider]  magnesium oxide (MAG-OX) 400 MG tablet Take 400 mg by mouth daily.   Yes [provider]  multivitamin-lutein (OCUVITE-LUTEIN) CAPS capsule Take 1 capsule by mouth daily.   Yes [provider]  mycophenolate (MYFORTIC) 180 MG EC tablet Take 180 mg by mouth 2 (two) times daily.   Yes [provider]  ondansetron (ZOFRAN) 4 MG tablet Take 1 tablet (4 mg total) by mouth every 8 (eight) hours as needed for nausea or vomiting. 10/24/20  Yes [name], [name]  rosuvastatin (CRESTOR) 20 MG tablet Take 1 tablet (20 mg total) by mouth daily. 12/20/20  Yes [name], MD  tacrolimus (PROGRAF) 1 MG capsule Take 2-3 mg by mouth See admin instructions. Take 3 mg by mouth in the morning and 2 mg in the evening 06/14/20  Yes [provider]  cyclobenzaprine (FLEXERIL) 10 MG tablet Take 1 tablet (10 mg total) by mouth 3 (three) times daily as needed for muscle spasms. Patient not taking: Reported on 10/13/2021 12/02/20   Vallarie Mare, MD    Physical Exam:  Vitals:   10/14/21 8657 10/14/21 0500 10/14/21 0515 10/14/21 0530  BP: 113/75 121/78 127/80 123/76  Pulse: 63 67 66 64  Resp: '17 19 19 18  '$ Temp:      TempSrc:      SpO2: 94% 97% 90% 95%  Weight:      Height:         Constitutional: Awake alert and oriented x3, no associated distress.   Skin: no rashes, no lesions, good skin turgor noted. Eyes: Pupils are equally reactive to light.  No evidence of scleral icterus or conjunctival pallor.  ENMT: Moist mucous membranes noted.  Posterior pharynx clear of any exudate or lesions.   Neck: normal, supple, no masses, no thyromegaly.  No evidence of jugular venous distension.   Respiratory: clear to auscultation bilaterally, no wheezing, no crackles. Normal respiratory effort. No accessory muscle use.  Cardiovascular: Regular rate and rhythm, no murmurs / rubs / gallops. No extremity edema. 2+ pedal pulses. No carotid bruits.  Chest:   Mild discomfort on palpation without crepitus or deformity.   Back:   Mild midline back tenderness particularly over the thoracic spine without crepitus or deformity. Abdomen: Abdomen is soft and nontender.  No evidence of intra-abdominal masses.  Positive bowel sounds noted in all quadrants.   Musculoskeletal: No joint deformity  upper and lower extremities. Good ROM, no contractures. Normal muscle tone.  Neurologic: CN 2-12 grossly intact. Sensation intact.  Patient moving all 4 extremities spontaneously.  Patient is following all commands.  Patient is responsive to verbal stimuli.   Psychiatric: Patient exhibits normal mood with appropriate affect.  Patient seems to possess insight as to their current situation.    Data Reviewed:  I have personally reviewed and interpreted labs, imaging.  Significant findings are:  Sodium 139, potassium 4.8, glucose 127, BUN 22, creatinine of 1.87. CBC revealing white blood cell count 6.3, hemoglobin 13.8, hematocrit 42.7, platelet count 224. 2 sets of cardiac enzymes obtained including initial troponin of 4 and second troponin being 4 COVID-19 PCR testing negative.   D-dimer 0.64.  EKG: Personally reviewed.  Rhythm is normal sinus rhythm with heart rate of 62 bpm.  No dynamic ST segment changes appreciated.   Assessment and Plan: * Chest pain Patient presenting with chest discomfort with typical and atypical features  Cycling cardiac enzymes, initial troponin unremarkable EKG reveals no evidence of dynamic ST segment change Chest imaging reveals no evidence of acute cardiopulmonary disease Patient currently chest pain-free Obtaining VQ scan in the morning due to slightly elevated D-dimer Providing continued home regimen of 81 mg of aspirin daily,, Coreg twice daily, Crestor. As needed nitroglycerin for further episodes of chest discomfort Lipid panel in the morning Echocardiogram in the morning If VQ scan is negative and cardiac enzymes remain unremarkable, chest discomfort is likely either musculoskeletal or pleuritic.  I do not believe that a cardiology consultation is necessary at this time.  Considering patient's concurrent complaints of shortness of breath over the past 3 days if VQ scan is negative day provider may decide to follow-up with a noncontrast CT chest to  identify any early developing pneumonia considering patient's immunocompromise state.   Diarrhea Acute onset Patient denies any recent antibiotic use or being started on any new medications that would have had the side effect of diarrhea Patient denies any recent ingestion of undercooked food or sick contacts with similar symptoms. Likely a viral etiology and likely self-limiting Supportive care If symptoms worsen consider stool work-up   Coronary artery disease involving native coronary artery of native heart Please see assessment and  plan above  Chronic kidney disease, stage 3b ([place]) History of ESRD status post renal transplant in 2016 Strict intake and output monitoring Creatinine near baseline (1.6 - 1.8) Minimizing nephrotoxic agents as much as possible Serial chemistries to monitor renal function and electrolytes   History of kidney transplant Continuing home regimen of immunosuppressants including tacrolimus and mycophenolate Avoiding nephrotoxic agents  Chronic diastolic CHF (congestive heart failure) (HCC) No clinical evidence of cardiogenic volume overload Strict input and output monitoring Daily weights Low-sodium diet   Essential hypertension Resume patients home regimen of oral antihypertensives Titrate antihypertensive regimen as necessary to achieve adequate BP control PRN intravenous antihypertensives for excessively elevated blood pressure    Mixed hyperlipidemia Continuing home regimen of lipid lowering therapy. Lipid panel ordered for the morning Will titrate regimen if LDL greater than 70        Code Status:  Full code  code status decision has been confirmed with: patient Family Communication: deferred   Consults: None  Severity of Illness:  The appropriate patient status for this patient is OBSERVATION. Observation status is judged to be reasonable and necessary in order to provide the required intensity of service to ensure the  patient's safety. The patient's presenting symptoms, physical exam findings, and initial radiographic and laboratory data in the context of their medical condition is felt to place them at decreased risk for further clinical deterioration. Furthermore, it is anticipated that the patient will be medically stable for discharge from the hospital within 2 midnights of admission.   Author:  Vernelle Emerald MD  10/14/2021 6:55 AM

## 2021-10-14 NOTE — Assessment & Plan Note (Addendum)
   Patient presenting with chest discomfort with typical and atypical features   Reproducible chest tenderness on exam with patient describing that chest discomfort occurs with deep inspiration all suggest that pain is either musculoskeletal or pleuritic.  Cycling cardiac enzymes, initial troponin unremarkable  EKG reveals no evidence of dynamic ST segment change  Chest imaging reveals no evidence of acute cardiopulmonary disease  Patient currently chest pain-free  Obtaining VQ scan in the morning due to slightly elevated D-dimer  Providing continued home regimen of 81 mg of aspirin daily,, Coreg twice daily, Crestor.  As needed nitroglycerin for further episodes of chest discomfort  Lipid panel in the morning  If VQ scan is negative and cardiac enzymes remain unremarkable, chest discomfort is likely either musculoskeletal or pleuritic.  I do not believe that a cardiology consultation is necessary at this time.  Considering patient's concurrent complaints of shortness of breath over the past 3 days if VQ scan is negative day provider may decide to follow-up with a noncontrast CT chest to identify any early developing pneumonia considering patient's immunocompromise state.

## 2021-10-14 NOTE — ED Notes (Signed)
Pt to NM

## 2021-10-14 NOTE — Assessment & Plan Note (Signed)
.   History of ESRD status post renal transplant in 2016 . Strict intake and output monitoring . Creatinine near baseline (1.6 - 1.8) . Minimizing nephrotoxic agents as much as possible . Serial chemistries to monitor renal function and electrolytes

## 2021-10-14 NOTE — Assessment & Plan Note (Signed)
.   Continuing home regimen of lipid lowering therapy. . Lipid panel ordered for the morning . Will titrate regimen if LDL greater than 70   

## 2021-10-14 NOTE — ED Notes (Signed)
Provided hospital bed for comfort.

## 2021-10-14 NOTE — Assessment & Plan Note (Signed)
   Continuing home regimen of immunosuppressants including tacrolimus and mycophenolate  Avoiding nephrotoxic agents

## 2021-10-14 NOTE — ED Notes (Signed)
Meal given. Pt complains of diarrhea today. ~8 instances starting today. MD Shalhoub made aware.

## 2021-10-14 NOTE — Assessment & Plan Note (Signed)
.   Resume patients home regimen of oral antihypertensives . Titrate antihypertensive regimen as necessary to achieve adequate BP control . PRN intravenous antihypertensives for excessively elevated blood pressure   

## 2021-10-14 NOTE — Discharge Summary (Signed)
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  Patient's work-up appears to be unremarkable, chest x-ray without overt infiltrate, troponin and EKG without clear etiology for ACS.  D-dimer minimally elevated at 0.64, when age-adjusted is negative.  Attempted VQ scan today however patient was unable to tolerate this due to her claustrophobia, offered benzodiazepine/similar to reattempt the test but given patient's resolution of chest pain and shortness of breath we discussed alternative options given she is otherwise stable for discharge and further outpatient work-up with PCP and cardiology for her pleuritic chest pain which she was agreeable to.  Patient's labs are generally  unremarkable, creatinine at baseline, A1c elevated at 6.5 and D-dimer at 0.64 but again when age-adjusted is negative.  She was without hypoxia, tachycardia, unilateral swelling and would qualify for [facility] if not for her age.  Discharge Diagnoses:  Principal  Problem:   Chest pain Active Problems:   Coronary artery disease involving native coronary artery of native heart   Diarrhea   Chronic kidney disease, stage 3b (HCC)   History of kidney transplant   Chronic diastolic CHF (congestive heart failure) (HCC)   Essential hypertension   Mixed hyperlipidemia    Discharge Instructions  Discharge Instructions     Discharge patient   Complete by: As directed    Discharge disposition: 01-Home or Self Care   Discharge patient date: 10/14/2021      Allergies as of 10/14/2021       Reactions   Keflex [cephalexin] Anaphylaxis   Ibuprofen    Raises blood pressure; pt states this is not correct   Xanax [alprazolam] Other (See Comments)   pt states this is not correct        Medication List     STOP taking these medications    cyclobenzaprine 10 MG tablet Commonly known as: FLEXERIL       TAKE these medications    acetaminophen 650 MG CR tablet Commonly known as: TYLENOL Take 650 mg by mouth every 8 (eight) hours as needed for pain.   amLODipine 5 MG tablet Commonly known as: NORVASC Take 5 mg by mouth daily.   aspirin EC 81 MG tablet Take 81 mg by mouth daily. Swallow whole.   carvedilol 12.5 MG tablet Commonly known as: COREG Take 12.5 mg by mouth 2 (two) times daily with a meal.   cetirizine 10 MG tablet Commonly known as: ZYRTEC Take 10 mg by mouth daily.   CO Q10 PO Take 1 capsule by mouth daily.   diclofenac Sodium 1 % Gel Commonly known as: VOLTAREN Apply 2 g topically 4 (four) times daily. What changed:  when to take this reasons to take this  lisinopril 5 MG tablet Commonly known as: ZESTRIL Take 5 mg by mouth daily.   magnesium oxide 400 MG tablet Commonly known as: MAG-OX Take 400 mg by mouth daily.   multivitamin-lutein Caps capsule Take 1 capsule by mouth daily.   mycophenolate 180 MG EC tablet Commonly known as: MYFORTIC Take 180 mg by mouth 2 (two) times daily.   ondansetron 4 MG  tablet Commonly known as: Zofran Take 1 tablet (4 mg total) by mouth every 8 (eight) hours as needed for nausea or vomiting.   Prevagen 10 MG Caps Generic drug: Apoaequorin Take 10 mg by mouth daily.   rosuvastatin 20 MG tablet Commonly known as: CRESTOR Take 1 tablet (20 mg total) by mouth daily.   tacrolimus 1 MG capsule Commonly known as: PROGRAF Take 2-3 mg by mouth See admin instructions. Take 3 mg by mouth in the morning and 2 mg in the evening   Vitamin D3 10 MCG (400 UNIT) Caps Take 400 Units by mouth daily.        Allergies  Allergen Reactions   Keflex [Cephalexin] Anaphylaxis   Ibuprofen     Raises blood pressure; pt states this is not correct   Xanax [Alprazolam] Other (See Comments)    pt states this is not correct     Consultations: None  Procedures/Studies: DG Chest 1 View  Result Date: 10/13/2021 CLINICAL DATA:  Chest pain, short of breath, otalgia EXAM: CHEST  1 VIEW COMPARISON:  03/18/2020 FINDINGS: Single frontal view of the chest demonstrates a stable cardiac silhouette. No change in thoracic aortic ectasia and atherosclerosis. No airspace disease, effusion, or pneumothorax. No acute bony abnormalities. IMPRESSION: 1. Stable chest, no acute process. Electronically Signed   By: Randa Ngo M.D.   On: 10/13/2021 18:04   ECHOCARDIOGRAM COMPLETE  Result Date: 10/14/2021    ECHOCARDIOGRAM REPORT   [name] Date of Exam: 10/14/2021 Medical Rec #:  [id number]       Height:       60.0 in Accession #:    [id number]      Weight:       211.0 lb Date of Birth:  1951-02-12       BSA:          1.910 m Patient Age:    71 years        BP:           157/83 mmHg Patient Gender: F               HR:           66 bpm. Exam Location:  Inpatient Procedure: 2D Echo, Color Doppler and Cardiac Doppler Indications:    R07.9* Chest pain, unspecified  History:        Patient has prior history of Echocardiogram examinations, most                 recent 04/13/2020.  CHF; Risk Factors:Hypertension, Dyslipidemia                 and h/o Kidney Transplant.  Sonographer:    Raquel Sarna Senior RDCS Referring Phys: [id number] [place]  1. Left ventricular ejection fraction, by estimation, is 60 to 65%. The left ventricle has normal function. The left ventricle has no regional wall motion abnormalities. There is mild left ventricular hypertrophy. Left ventricular diastolic parameters are consistent with Grade I diastolic dysfunction (impaired relaxation).  2. Right ventricular systolic function is normal. The right  ventricular size is normal. There is normal pulmonary artery systolic pressure.  3. Trivial mitral valve regurgitation.  4. The aortic valve is normal in structure. Aortic valve regurgitation is not visualized.  5. Aortic no signficant ascending aortic aneurysm. FINDINGS  Left Ventricle: Left ventricular ejection fraction, by estimation, is 60 to 65%. The left ventricle has normal function. The left ventricle has no regional wall motion abnormalities. The left ventricular internal cavity size was normal in size. There is  mild left ventricular hypertrophy. Left ventricular diastolic parameters are consistent with Grade I diastolic dysfunction (impaired relaxation). Right Ventricle: The right ventricular size is normal. No increase in right ventricular wall thickness. Right ventricular systolic function is normal. There is normal pulmonary artery systolic pressure. The tricuspid regurgitant velocity is 2.23 m/s, and  with an assumed right atrial pressure of 3 mmHg, the estimated right ventricular systolic pressure is 84.1 mmHg. Left Atrium: Left atrial size was normal in size. Right Atrium: Right atrial size was normal in size. Pericardium: There is no evidence of pericardial effusion. Mitral Valve: Mild mitral annular calcification. Trivial mitral valve regurgitation. Tricuspid Valve: The tricuspid valve is normal in structure. Tricuspid valve regurgitation is  not demonstrated. Aortic Valve: The aortic valve is normal in structure. Aortic valve regurgitation is not visualized. Pulmonic Valve: The pulmonic valve was grossly normal. Pulmonic valve regurgitation is mild. Aorta: No signficant ascending aortic aneurysm.  LEFT VENTRICLE PLAX 2D LVIDd:         4.10 cm   Diastology LVIDs:         2.20 cm   LV e' medial:    5.44 cm/s LV PW:         1.10 cm   LV E/e' medial:  12.8 LV IVS:        1.10 cm   LV e' lateral:   7.51 cm/s LVOT diam:     2.10 cm   LV E/e' lateral: 9.3 LV SV:         53 LV SV Index:   28 LVOT Area:     3.46 cm  RIGHT VENTRICLE RV S prime:     13.40 cm/s TAPSE (M-mode): 2.3 cm LEFT ATRIUM             Index        RIGHT ATRIUM          Index LA diam:        4.40 cm 2.30 cm/m   RA Area:     9.78 cm LA Vol (A2C):   48.2 ml 25.24 ml/m  RA Volume:   17.90 ml 9.37 ml/m LA Vol (A4C):   53.7 ml 28.12 ml/m LA Biplane Vol: 54.5 ml 28.54 ml/m  AORTIC VALVE LVOT Vmax:   71.10 cm/s LVOT Vmean:  46.900 cm/s LVOT VTI:    0.153 m  AORTA Ao Root diam: 3.40 cm Ao Asc diam:  3.60 cm MITRAL VALVE               TRICUSPID VALVE MV Area (PHT): 3.39 cm    TR Peak grad:   19.9 mmHg MV Decel Time: 224 msec    TR Vmax:        223.00 cm/s MV E velocity: 69.80 cm/s MV A velocity: 82.70 cm/s  SHUNTS MV E/A ratio:  0.84        Systemic VTI:  0.15 m  Systemic Diam: 2.10 cm Phineas Inches Electronically signed by Phineas Inches Signature Date/Time: 10/14/2021/10:27:05 AM    Final      Subjective: No acute issues or events overnight, denies nausea vomiting constipation headache fevers chills or chest pain.  Diarrhea ongoing but improving, midline posterior pleuritic chest pain drastically improving but not quite resolved.   Discharge Exam: Vitals:   10/14/21 0900 10/14/21 1310  BP: 129/82 139/78  Pulse: 68 77  Resp: 19 17  Temp:    SpO2: 96% 97%   Vitals:   10/14/21 0800 10/14/21 0801 10/14/21 0900 10/14/21 1310  BP: (!) 157/83 (!) 157/83 129/82  139/78  Pulse: 87 68 68 77  Resp: '16  19 17  '$ Temp:      TempSrc:      SpO2: 96%  96% 97%  Weight:      Height:        General: Pt is alert, awake, not in acute distress Cardiovascular: RRR, S1/S2 +, no rubs, no gallops Respiratory: CTA bilaterally, no wheezing, no rhonchi Abdominal: Soft, NT, ND, bowel sounds + Extremities: no edema, no cyanosis    The results of significant diagnostics from this hospitalization (including imaging, microbiology, ancillary and laboratory) are listed below for reference.     Microbiology: Recent Results (from the past 240 hour(s))  Resp Panel by RT-PCR (Flu A&B, Covid) Anterior Nasal Swab     Status: None   Collection Time: 10/13/21  7:27 PM   Specimen: Anterior Nasal Swab  Result Value Ref Range Status   SARS Coronavirus 2 by RT PCR NEGATIVE NEGATIVE Final    Comment: (NOTE) SARS-CoV-2 target nucleic acids are NOT DETECTED.  The SARS-CoV-2 RNA is generally detectable in upper respiratory specimens during the acute phase of infection. The lowest concentration of SARS-CoV-2 viral copies this assay can detect is 138 copies/mL. A negative result does not preclude SARS-Cov-2 infection and should not be used as the sole basis for treatment or other patient management decisions. A negative result may occur with  improper specimen collection/handling, submission of specimen other than nasopharyngeal swab, presence of viral mutation(s) within the areas targeted by this assay, and inadequate number of viral copies(<138 copies/mL). A negative result must be combined with clinical observations, patient history, and epidemiological information. The expected result is Negative.  Fact Sheet for Patients:  EntrepreneurPulse.com.au  Fact Sheet for Healthcare Providers:  IncredibleEmployment.be  This test is no t yet approved or cleared by the [place] FDA and  has been authorized for detection and/or diagnosis  of SARS-CoV-2 by FDA under an Emergency Use Authorization (EUA). This EUA will remain  in effect (meaning this test can be used) for the duration of the COVID-19 declaration under Section 564(b)(1) of the Act, 21 U.S.C.section 360bbb-3(b)(1), unless the authorization is terminated  or revoked sooner.       Influenza A by PCR NEGATIVE NEGATIVE Final   Influenza B by PCR NEGATIVE NEGATIVE Final    Comment: (NOTE) The Xpert Xpress SARS-CoV-2/FLU/RSV plus assay is intended as an aid in the diagnosis of influenza from Nasopharyngeal swab specimens and should not be used as a sole basis for treatment. Nasal washings and aspirates are unacceptable for Xpert Xpress SARS-CoV-2/FLU/RSV testing.  Fact Sheet for Patients: EntrepreneurPulse.com.au  Fact Sheet for Healthcare Providers: IncredibleEmployment.be  This test is not yet approved or cleared by the [place] FDA and has been authorized for detection and/or diagnosis of SARS-CoV-2 by FDA under an Emergency Use Authorization (EUA). This EUA will remain  in effect (meaning this test can be used) for the duration of the COVID-19 declaration under Section 564(b)(1) of the Act, 21 U.S.C. section 360bbb-3(b)(1), unless the authorization is terminated or revoked.  Performed at [facility], [place] [address], [place], [place] [address]      Labs: BNP (last 3 results) No results for input(s): BNP in the last 8760 hours. Basic Metabolic Panel: Recent Labs  Lab 10/13/21 1719 10/14/21 0142  NA 139 140  K 4.8 4.0  CL 107 111  CO2 26 21*  GLUCOSE 127* 183*  BUN 22 23  CREATININE 1.87* 1.83*  CALCIUM 9.3 8.7*  MG  --  1.9   Liver Function Tests: Recent Labs  Lab 10/14/21 0142  AST 17  ALT 9  ALKPHOS 45  BILITOT 0.8  PROT 5.6*  ALBUMIN 2.9*   No results for input(s): LIPASE, AMYLASE in the last 168 hours. No results for input(s): AMMONIA in the last 168 hours. CBC: Recent Labs   Lab 10/13/21 1719 10/14/21 0142  WBC 6.3 6.5  NEUTROABS  --  4.0  HGB 13.8 13.4  HCT 42.7 41.1  MCV 90.1 91.3  PLT 224 207   Cardiac Enzymes: No results for input(s): CKTOTAL, CKMB, CKMBINDEX, TROPONINI in the last 168 hours. BNP: Invalid input(s): POCBNP CBG: No results for input(s): GLUCAP in the last 168 hours. D-Dimer Recent Labs    10/13/21 1951  DDIMER 0.64*   Hgb A1c Recent Labs    10/14/21 0142  HGBA1C 6.5*   Lipid Profile Recent Labs    10/14/21 0142  CHOL 143  HDL 56  LDLCALC 74  TRIG 64  CHOLHDL 2.6   Thyroid function studies No results for input(s): TSH, T4TOTAL, T3FREE, THYROIDAB in the last 72 hours.  Invalid input(s): FREET3 Anemia work up No results for input(s): VITAMINB12, FOLATE, FERRITIN, TIBC, IRON, RETICCTPCT in the last 72 hours. Urinalysis No results found for: COLORURINE, APPEARANCEUR, [place], [place], [place], [place], [place], [place], PROTEINUR, UROBILINOGEN, NITRITE, LEUKOCYTESUR Sepsis Labs Invalid input(s): PROCALCITONIN,  WBC,  LACTICIDVEN Microbiology Recent Results (from the past 240 hour(s))  Resp Panel by RT-PCR (Flu A&B, Covid) Anterior Nasal Swab     Status: None   Collection Time: 10/13/21  7:27 PM   Specimen: Anterior Nasal Swab  Result Value Ref Range Status   SARS Coronavirus 2 by RT PCR NEGATIVE NEGATIVE Final    Comment: (NOTE) SARS-CoV-2 target nucleic acids are NOT DETECTED.  The SARS-CoV-2 RNA is generally detectable in upper respiratory specimens during the acute phase of infection. The lowest concentration of SARS-CoV-2 viral copies this assay can detect is 138 copies/mL. A negative result does not preclude SARS-Cov-2 infection and should not be used as the sole basis for treatment or other patient management decisions. A negative result may occur with  improper specimen collection/handling, submission of specimen other than nasopharyngeal swab, presence of viral mutation(s) within the areas  targeted by this assay, and inadequate number of viral copies(<138 copies/mL). A negative result must be combined with clinical observations, patient history, and epidemiological information. The expected result is Negative.  Fact Sheet for Patients:  EntrepreneurPulse.com.au  Fact Sheet for Healthcare Providers:  IncredibleEmployment.be  This test is no t yet approved or cleared by the [place] FDA and  has been authorized for detection and/or diagnosis of SARS-CoV-2 by FDA under an Emergency Use Authorization (EUA). This EUA will remain  in effect (meaning this test can be used) for the duration of the COVID-19 declaration under  Section 564(b)(1) of the Act, 21 U.S.C.section 360bbb-3(b)(1), unless the authorization is terminated  or revoked sooner.       Influenza A by PCR NEGATIVE NEGATIVE Final   Influenza B by PCR NEGATIVE NEGATIVE Final    Comment: (NOTE) The Xpert Xpress SARS-CoV-2/FLU/RSV plus assay is intended as an aid in the diagnosis of influenza from Nasopharyngeal swab specimens and should not be used as a sole basis for treatment. Nasal washings and aspirates are unacceptable for Xpert Xpress SARS-CoV-2/FLU/RSV testing.  Fact Sheet for Patients: EntrepreneurPulse.com.au  Fact Sheet for Healthcare Providers: IncredibleEmployment.be  This test is not yet approved or cleared by the [place] FDA and has been authorized for detection and/or diagnosis of SARS-CoV-2 by FDA under an Emergency Use Authorization (EUA). This EUA will remain in effect (meaning this test can be used) for the duration of the COVID-19 declaration under Section 564(b)(1) of the Act, 21 U.S.C. section 360bbb-3(b)(1), unless the authorization is terminated or revoked.  Performed at [facility], [place] [address], [place], [place] [address]      Time coordinating discharge: Over 30  minutes  SIGNED:   Little Ishikawa, DO [facility] 10/14/2021, 2:51 PM Pager   If 7PM-7AM, please contact night-coverage [email or web address]

## 2021-10-14 NOTE — ED Notes (Addendum)
Pt returns from vascular

## 2021-10-14 NOTE — Assessment & Plan Note (Signed)
·   Please see assessment and plan above °

## 2021-10-14 NOTE — ED Notes (Signed)
Pt's O2 remained 95-96% RA while ambulating

## 2021-10-14 NOTE — ED Notes (Addendum)
PT to vascular 

## 2021-10-14 NOTE — Assessment & Plan Note (Addendum)
   Acute onset  Patient denies any recent antibiotic use or being started on any new medications that would have had the side effect of diarrhea  Patient denies any recent ingestion of undercooked food or sick contacts with similar symptoms.  Likely a viral etiology and likely self-limiting  Supportive care  If symptoms worsen consider stool work-up

## 2021-10-14 NOTE — Assessment & Plan Note (Signed)
No clinical evidence of cardiogenic volume overload Strict input and output monitoring Daily weights Low-sodium diet  

## 2021-10-14 NOTE — Progress Notes (Signed)
Echocardiogram 2D Echocardiogram has been performed.  Oneal Deputy Jazz Rogala RDCS 10/14/2021, 9:33 AM

## 2021-10-17 ENCOUNTER — Telehealth: Payer: Self-pay

## 2021-10-17 NOTE — Telephone Encounter (Signed)
Transition Care Management Follow-up Telephone Call Date of discharge and from where: [place] 10-14-21 Dx: chest pain  How have you been since you were released from the hospital? Doing good  Any questions or concerns? No  Items Reviewed: Did the pt receive and understand the discharge instructions provided? Yes  Medications obtained and verified? Yes  Other? No  Any new allergies since your discharge? No  Dietary orders reviewed? Yes Do you have support at home? Yes   Home Care and Equipment/Supplies: Were home health services ordered? no If so, what is the name of the agency? na  Has the agency set up a time to come to the patient's home? not applicable Were any new equipment or medical supplies ordered?  No What is the name of the medical supply agency? na Were you able to get the supplies/equipment? not applicable Do you have any questions related to the use of the equipment or supplies? No  Functional Questionnaire: (I = Independent and D = Dependent) ADLs: I  Bathing/Dressing- I  Meal Prep- I  Eating- I  Maintaining continence- I  Transferring/Ambulation- I  Managing Meds- I  Follow up appointments reviewed:  PCP Hospital f/u appt confirmed? Yes  Scheduled to see Dr Mitchel Honour on 10-20-21 @ 120pm. [facility] f/u appt confirmed? No  . Are transportation arrangements needed? No  If their condition worsens, is the pt aware to call PCP or go to the Emergency Dept.? Yes Was the patient provided with contact information for the PCP's office or ED? Yes Was to pt encouraged to call back with questions or concerns? Yes

## 2021-10-20 ENCOUNTER — Ambulatory Visit (INDEPENDENT_AMBULATORY_CARE_PROVIDER_SITE_OTHER): Payer: Medicare Other | Admitting: Emergency Medicine

## 2021-10-20 ENCOUNTER — Encounter: Payer: Self-pay | Admitting: Emergency Medicine

## 2021-10-20 VITALS — BP 120/66 | HR 63 | Temp 97.9°F | Ht 60.0 in | Wt 212.0 lb

## 2021-10-20 DIAGNOSIS — I5032 Chronic diastolic (congestive) heart failure: Secondary | ICD-10-CM

## 2021-10-20 DIAGNOSIS — D849 Immunodeficiency, unspecified: Secondary | ICD-10-CM | POA: Diagnosis not present

## 2021-10-20 DIAGNOSIS — Z940 Kidney transplant status: Secondary | ICD-10-CM

## 2021-10-20 DIAGNOSIS — N1832 Chronic kidney disease, stage 3b: Secondary | ICD-10-CM | POA: Diagnosis not present

## 2021-10-20 DIAGNOSIS — E785 Hyperlipidemia, unspecified: Secondary | ICD-10-CM

## 2021-10-20 DIAGNOSIS — Z09 Encounter for follow-up examination after completed treatment for conditions other than malignant neoplasm: Secondary | ICD-10-CM | POA: Diagnosis not present

## 2021-10-20 DIAGNOSIS — R7303 Prediabetes: Secondary | ICD-10-CM

## 2021-10-20 DIAGNOSIS — I10 Essential (primary) hypertension: Secondary | ICD-10-CM

## 2021-10-20 NOTE — Patient Instructions (Signed)
Health Maintenance After Age 70 After age 70, you are at a higher risk for certain long-term diseases and infections as well as injuries from falls. Falls are a major cause of broken bones and head injuries in people who are older than age 70. Getting regular preventive care can help to keep you healthy and well. Preventive care includes getting regular testing and making lifestyle changes as recommended by your health care provider. Talk with your health care provider about: Which screenings and tests you should have. A screening is a test that checks for a disease when you have no symptoms. A diet and exercise plan that is right for you. What should I know about screenings and tests to prevent falls? Screening and testing are the best ways to find a health problem early. Early diagnosis and treatment give you the best chance of managing medical conditions that are common after age 70. Certain conditions and lifestyle choices may make you more likely to have a fall. Your health care provider may recommend: Regular vision checks. Poor vision and conditions such as cataracts can make you more likely to have a fall. If you wear glasses, make sure to get your prescription updated if your vision changes. Medicine review. Work with your health care provider to regularly review all of the medicines you are taking, including over-the-counter medicines. Ask your health care provider about any side effects that may make you more likely to have a fall. Tell your health care provider if any medicines that you take make you feel dizzy or sleepy. Strength and balance checks. Your health care provider may recommend certain tests to check your strength and balance while standing, walking, or changing positions. Foot health exam. Foot pain and numbness, as well as not wearing proper footwear, can make you more likely to have a fall. Screenings, including: Osteoporosis screening. Osteoporosis is a condition that causes  the bones to get weaker and break more easily. Blood pressure screening. Blood pressure changes and medicines to control blood pressure can make you feel dizzy. Depression screening. You may be more likely to have a fall if you have a fear of falling, feel depressed, or feel unable to do activities that you used to do. Alcohol use screening. Using too much alcohol can affect your balance and may make you more likely to have a fall. Follow these instructions at home: Lifestyle Do not drink alcohol if: Your health care provider tells you not to drink. If you drink alcohol: Limit how much you have to: 0-1 drink a day for women. 0-2 drinks a day for men. Know how much alcohol is in your drink. In the [place] one drink equals one 12 oz bottle of beer (355 mL), one 5 oz glass of wine (148 mL), or one 1 oz glass of hard liquor (44 mL). Do not use any products that contain nicotine or tobacco. These products include cigarettes, chewing tobacco, and vaping devices, such as e-cigarettes. If you need help quitting, ask your health care provider. Activity  Follow a regular exercise program to stay fit. This will help you maintain your balance. Ask your health care provider what types of exercise are appropriate for you. If you need a cane or walker, use it as recommended by your health care provider. Wear supportive shoes that have nonskid soles. Safety  Remove any tripping hazards, such as rugs, cords, and clutter. Install safety equipment such as grab bars in bathrooms and safety rails on stairs. Keep rooms and walkways   well-lit. General instructions Talk with your health care provider about your risks for falling. Tell your health care provider if: You fall. Be sure to tell your health care provider about all falls, even ones that seem minor. You feel dizzy, tiredness (fatigue), or off-balance. Take over-the-counter and prescription medicines only as told by your health care provider. These include  supplements. Eat a healthy diet and maintain a healthy weight. A healthy diet includes low-fat dairy products, low-fat (lean) meats, and fiber from whole grains, beans, and lots of fruits and vegetables. Stay current with your vaccines. Schedule regular health, dental, and eye exams. Summary Having a healthy lifestyle and getting preventive care can help to protect your health and wellness after age 70. Screening and testing are the best way to find a health problem early and help you avoid having a fall. Early diagnosis and treatment give you the best chance for managing medical conditions that are more common for people who are older than age 70. Falls are a major cause of broken bones and head injuries in people who are older than age 70. Take precautions to prevent a fall at home. Work with your health care provider to learn what changes you can make to improve your health and wellness and to prevent falls. This information is not intended to replace advice given to you by your health care provider. Make sure you discuss any questions you have with your health care provider. Document Revised: 09/20/2020 Document Reviewed: 09/20/2020 Elsevier Patient Education  2023 [employer]  

## 2021-10-20 NOTE — Assessment & Plan Note (Signed)
No more chest pain.  Asymptomatic.  Doing well without concerns.

## 2021-10-20 NOTE — Assessment & Plan Note (Signed)
Stable.  Recent echocardiogram report reviewed.  No significant abnormalities.

## 2021-10-20 NOTE — Assessment & Plan Note (Signed)
Stable.  Well-controlled hypertension. BP Readings from Last 3 Encounters:  10/20/21 120/66  10/14/21 133/87  09/13/21 124/68  Continue amlodipine 5 mg, carvedilol 12.5 mg twice a day, and lisinopril 5 mg daily.

## 2021-10-20 NOTE — Progress Notes (Signed)
Megan Bradford 71 y.o.   Chief Complaint  Patient presents with   ED follow up    Chest pain    HISTORY OF PRESENT ILLNESS: This is a 71 y.o. female here for follow-up of recent hospital admission when she presented on 10/13/2021 complaining of chest pain.  Released the following day.  Negative work-up.  Doing well.  Has no complaints or any other medical concerns today. Summary of emergency department visit as follows:  Megan Bradford is a 71 y.o. female hx of HTN, CAD, kidney transplant on tacrolimus, previous PE not anticoagulated, here with SOB and chest pain. Patient has been having SOB at rest and worse with exertion. Has aching chest pain as well. Patient's Cr is baseline around 1.7. Given hx of PE, I initially ordered CTA chest. However, patient doesn't want CTA given hx of kidney transplant. D-dimer was sent and was elevated. Trop neg x 2. She states that she is willing to get VQ scan and cardiac evaluation. Will admit for observation for r/o ACS and VQ scan to r/o PE   Unable to do VQ scan due to claustrophobia.  HPI   Prior to Admission medications   Medication Sig Start Date End Date Taking? Authorizing Provider  acetaminophen (TYLENOL) 650 MG CR tablet Take 650 mg by mouth every 8 (eight) hours as needed for pain.   Yes [provider]  amLODipine (NORVASC) 5 MG tablet Take 5 mg by mouth daily.   Yes [provider]  Apoaequorin (PREVAGEN) 10 MG CAPS Take 10 mg by mouth daily.   Yes [provider]  aspirin EC 81 MG tablet Take 81 mg by mouth daily. Swallow whole.   Yes [provider]  carvedilol (COREG) 12.5 MG tablet Take 12.5 mg by mouth 2 (two) times daily with a meal.   Yes [provider]  cetirizine (ZYRTEC) 10 MG tablet Take 10 mg by mouth daily.   Yes [provider]  Cholecalciferol (VITAMIN D3) 10 MCG (400 UNIT) CAPS Take 400 Units by mouth daily.   Yes [provider]  Coenzyme Q10 (CO Q10 PO) Take 1  capsule by mouth daily.   Yes [provider]  diclofenac Sodium (VOLTAREN) 1 % GEL Apply 2 g topically 4 (four) times daily. Patient taking differently: Apply 2 g topically daily as needed (pain). 12/01/19  Yes Wieters, Hallie C, [name]  lisinopril (ZESTRIL) 5 MG tablet Take 5 mg by mouth daily. 03/26/21  Yes [provider]  magnesium oxide (MAG-OX) 400 MG tablet Take 400 mg by mouth daily.   Yes [provider]  multivitamin-lutein (OCUVITE-LUTEIN) CAPS capsule Take 1 capsule by mouth daily.   Yes [provider]  mycophenolate (MYFORTIC) 180 MG EC tablet Take 180 mg by mouth 2 (two) times daily.   Yes [provider]  ondansetron (ZOFRAN) 4 MG tablet Take 1 tablet (4 mg total) by mouth every 8 (eight) hours as needed for nausea or vomiting. 10/24/20  Yes [name], [name]  rosuvastatin (CRESTOR) 20 MG tablet Take 1 tablet (20 mg total) by mouth daily. 12/20/20  Yes [name], MD  tacrolimus (PROGRAF) 1 MG capsule Take 2-3 mg by mouth See admin instructions. Take 3 mg by mouth in the morning and 2 mg in the evening 06/14/20  Yes [provider]    Allergies  Allergen Reactions   Keflex [Cephalexin] Anaphylaxis   Ibuprofen     Raises blood pressure; pt states this is not correct  Xanax [Alprazolam] Other (See Comments)    pt states this is not correct     Patient Active Problem List   Diagnosis Date Noted   Chronic diastolic CHF (congestive heart failure) ([place]) 10/14/2021   Mixed hyperlipidemia 10/14/2021   Diarrhea 10/14/2021   Chest pain 10/13/2021   Dyssynergic defecation    Incontinence of feces with fecal urgency    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/01/2020   Trigger finger, right middle finger 10/28/2020   Coronary artery disease involving native coronary artery of native heart 08/30/2020   Immunosuppression ([place]) 08/30/2020   Chronic kidney disease, stage 3b ([place]) 12/17/2019   History of kidney transplant  11/27/2019   Essential hypertension 11/27/2019   Prediabetes 11/27/2019   Body mass index (BMI) of 38.0-38.9 in adult 11/27/2019    Past Medical History:  Diagnosis Date   Cataract    Phreesia 11/24/2019- removed both   Chronic kidney disease    Phreesia 11/24/2019   Clotting disorder ([place])    PE 2010 per pt there really was not a PE   Heart murmur    Hypertension    Phreesia 11/24/2019   Kidney transplanted    Pre-diabetes    no meds   Pulmonary embolism ([place]) 2010    Past Surgical History:  Procedure Laterality Date   ANAL RECTAL MANOMETRY N/A 08/03/2021   Procedure: ANO RECTAL MANOMETRY;  Surgeon: Thornton Park, MD;  Location: [facility];  Service: Gastroenterology;  Laterality: N/A;   CARDIAC CATHETERIZATION     x2   CESAREAN SECTION N/A    Phreesia 11/24/2019   CHOLECYSTECTOMY N/A    Phreesia 11/24/2019   COLONOSCOPY     DILATION AND CURETTAGE OF UTERUS     EYE SURGERY N/A    Cataracts removed Phreesia 11/24/2019   HIP SURGERY Left 1990   Hip dysplasia cleaned out per patient   KIDNEY TRANSPLANT     POLYPECTOMY     HPP   TONSILLECTOMY     TRANSFORAMINAL LUMBAR INTERBODY FUSION (TLIF) WITH PEDICLE SCREW FIXATION 1 LEVEL N/A 12/01/2020   Procedure: Transiforaminal Lumbar Interbody Fusion Lumbar four--Lumbar five;  Surgeon: Vallarie Mare, MD;  Location: [facility];  Service: Neurosurgery;  Laterality: N/A;   TRIGGER FINGER RELEASE Right 10/28/2020   Long finger    Social History   Socioeconomic History   Marital status: Widowed    Spouse name: Not on file   Number of children: Not on file   Years of education: Not on file   Highest education level: Not on file  Occupational History   Not on file  Tobacco Use   Smoking status: Never   Smokeless tobacco: Never  Vaping Use   Vaping Use: Never used  Substance and Sexual Activity   Alcohol use: Never   Drug use: Never   Sexual activity: Not Currently    Birth control/protection: None  Other  Topics Concern   Not on file  Social History Narrative   Not on file   Social Determinants of Health   Financial Resource Strain: Low Risk  (08/25/2021)   Overall Financial Resource Strain (CARDIA)    Difficulty of Paying Living Expenses: Not hard at all  Food Insecurity: No Food Insecurity (08/25/2021)   Hunger Vital Sign    Worried About Running Out of Food in the Last Year: Never true    Ran Out of Food in the Last Year: Never true  Transportation Needs: No Transportation Needs (08/25/2021)   [place] - Transportation  Lack of Transportation (Medical): No    Lack of Transportation (Non-Medical): No  Physical Activity: Sufficiently Active (08/25/2021)   Exercise Vital Sign    Days of Exercise per Week: 5 days    Minutes of Exercise per Session: 30 min  Stress: No Stress Concern Present (08/25/2021)   [place]    Feeling of Stress : Not at all  Social Connections: Moderately Integrated (08/25/2021)   Social Connection and Isolation Panel [NHANES]    Frequency of Communication with Friends and Family: More than three times a week    Frequency of Social Gatherings with Friends and Family: More than three times a week    Attends Religious Services: More than 4 times per year    Active Member of [employer] or Organizations: Yes    Attends Archivist Meetings: More than 4 times per year    Marital Status: Widowed  Intimate Partner Violence: Not At Risk (08/25/2021)   Humiliation, Afraid, Rape, and Kick questionnaire    Fear of Current or Ex-Partner: No    Emotionally Abused: No    Physically Abused: No    Sexually Abused: No    Family History  Problem Relation Age of Onset   Diabetes Mother    Asthma Mother    Hypertension Father    Coronary artery disease Father    Colon cancer Neg Hx    Colon polyps Neg Hx    Esophageal cancer Neg Hx    Rectal cancer Neg Hx    Stomach cancer Neg Hx      Review of  Systems  Constitutional: Negative.  Negative for fever.  HENT: Negative.  Negative for congestion and sore throat.   Respiratory: Negative.  Negative for cough and shortness of breath.   Cardiovascular: Negative.  Negative for chest pain and palpitations.  Gastrointestinal: Negative.  Negative for abdominal pain, diarrhea, nausea and vomiting.  Genitourinary: Negative.   Skin: Negative.  Negative for rash.  Neurological:  Negative for dizziness and headaches.  All other systems reviewed and are negative.  Today's Vitals   10/20/21 1302  BP: 120/66  Pulse: 63  Temp: 97.9 F (36.6 C)  TempSrc: Oral  SpO2: 97%  Weight: 212 lb (96.2 kg)  Height: 5' (1.524 m)   Body mass index is 41.4 kg/m.   Physical Exam Vitals reviewed.  Constitutional:      Appearance: Normal appearance.  HENT:     Head: Normocephalic.     Mouth/Throat:     Mouth: Mucous membranes are moist.     Pharynx: Oropharynx is clear.  Eyes:     Extraocular Movements: Extraocular movements intact.     Pupils: Pupils are equal, round, and reactive to light.  Cardiovascular:     Rate and Rhythm: Normal rate and regular rhythm.     Pulses: Normal pulses.     Heart sounds: Normal heart sounds.  Pulmonary:     Effort: Pulmonary effort is normal.     Breath sounds: Normal breath sounds.  Musculoskeletal:     Cervical back: No tenderness.  Lymphadenopathy:     Cervical: No cervical adenopathy.  Skin:    General: Skin is warm and dry.     Capillary Refill: Capillary refill takes less than 2 seconds.  Neurological:     General: No focal deficit present.     Mental Status: She is alert and oriented to person, place, and time.      ASSESSMENT &  PLAN: A total of 48 minutes was spent with the patient and counseling/coordination of care regarding preparing for this visit, review of most recent hospital admission notes, review of most recent blood work results, review of multiple chronic medical problems and their  management, review of all medications, review of most recent imaging reports including EKG, prognosis, documentation and need for follow-up.  Problem List Items Addressed This Visit       Cardiovascular and Mediastinum   Essential hypertension    Stable.  Well-controlled hypertension. BP Readings from Last 3 Encounters:  10/20/21 120/66  10/14/21 133/87  09/13/21 124/68  Continue amlodipine 5 mg, carvedilol 12.5 mg twice a day, and lisinopril 5 mg daily.       Chronic diastolic CHF (congestive heart failure) (H[facility])    Stable.  Recent echocardiogram report reviewed.  No significant abnormalities.        Genitourinary   Chronic kidney disease, stage 3b ([place])    Stable kidney function tests.  Follows up with nephrologist on a regular basis.        Other   History of kidney transplant   Prediabetes   Immunosuppression [facility])   Hospital discharge follow-up - Primary    No more chest pain.  Asymptomatic.  Doing well without concerns.      Dyslipidemia    Stable.  Continue rosuvastatin 20 mg daily.      Other Visit Diagnoses     Stage 3b chronic kidney disease [facility])          Patient Instructions  Health Maintenance After Age 50 After age 6, you are at a higher risk for certain long-term diseases and infections as well as injuries from falls. Falls are a major cause of broken bones and head injuries in people who are older than age 69. Getting regular preventive care can help to keep you healthy and well. Preventive care includes getting regular testing and making lifestyle changes as recommended by your health care provider. Talk with your health care provider about: Which screenings and tests you should have. A screening is a test that checks for a disease when you have no symptoms. A diet and exercise plan that is right for you. What should I know about screenings and tests to prevent falls? Screening and testing are the best ways to find a health problem early. Early  diagnosis and treatment give you the best chance of managing medical conditions that are common after age 31. Certain conditions and lifestyle choices may make you more likely to have a fall. Your health care provider may recommend: Regular vision checks. Poor vision and conditions such as cataracts can make you more likely to have a fall. If you wear glasses, make sure to get your prescription updated if your vision changes. Medicine review. Work with your health care provider to regularly review all of the medicines you are taking, including over-the-counter medicines. Ask your health care provider about any side effects that may make you more likely to have a fall. Tell your health care provider if any medicines that you take make you feel dizzy or sleepy. Strength and balance checks. Your health care provider may recommend certain tests to check your strength and balance while standing, walking, or changing positions. Foot health exam. Foot pain and numbness, as well as not wearing proper footwear, can make you more likely to have a fall. Screenings, including: Osteoporosis screening. Osteoporosis is a condition that causes the bones to get weaker and break more easily.  Blood pressure screening. Blood pressure changes and medicines to control blood pressure can make you feel dizzy. Depression screening. You may be more likely to have a fall if you have a fear of falling, feel depressed, or feel unable to do activities that you used to do. Alcohol use screening. Using too much alcohol can affect your balance and may make you more likely to have a fall. Follow these instructions at home: Lifestyle Do not drink alcohol if: Your health care provider tells you not to drink. If you drink alcohol: Limit how much you have to: 0-1 drink a day for women. 0-2 drinks a day for men. Know how much alcohol is in your drink. In the [place] one drink equals one 12 oz bottle of beer (355 mL), one 5 oz glass of  wine (148 mL), or one 1 oz glass of hard liquor (44 mL). Do not use any products that contain nicotine or tobacco. These products include cigarettes, chewing tobacco, and vaping devices, such as e-cigarettes. If you need help quitting, ask your health care provider. Activity  Follow a regular exercise program to stay fit. This will help you maintain your balance. Ask your health care provider what types of exercise are appropriate for you. If you need a cane or walker, use it as recommended by your health care provider. Wear supportive shoes that have nonskid soles. Safety  Remove any tripping hazards, such as rugs, cords, and clutter. Install safety equipment such as grab bars in bathrooms and safety rails on stairs. Keep rooms and walkways well-lit. General instructions Talk with your health care provider about your risks for falling. Tell your health care provider if: You fall. Be sure to tell your health care provider about all falls, even ones that seem minor. You feel dizzy, tiredness (fatigue), or off-balance. Take over-the-counter and prescription medicines only as told by your health care provider. These include supplements. Eat a healthy diet and maintain a healthy weight. A healthy diet includes low-fat dairy products, low-fat (lean) meats, and fiber from whole grains, beans, and lots of fruits and vegetables. Stay current with your vaccines. Schedule regular health, dental, and eye exams. Summary Having a healthy lifestyle and getting preventive care can help to protect your health and wellness after age 13. Screening and testing are the best way to find a health problem early and help you avoid having a fall. Early diagnosis and treatment give you the best chance for managing medical conditions that are more common for people who are older than age 14. Falls are a major cause of broken bones and head injuries in people who are older than age 45. Take precautions to prevent a fall  at home. Work with your health care provider to learn what changes you can make to improve your health and wellness and to prevent falls. This information is not intended to replace advice given to you by your health care provider. Make sure you discuss any questions you have with your health care provider. Document Revised: 09/20/2020 Document Reviewed: 09/20/2020 Elsevier Patient Education  [place], MD [facility]

## 2021-10-20 NOTE — Assessment & Plan Note (Signed)
Stable.  Continue rosuvastatin 20 mg daily. ?

## 2021-10-20 NOTE — Assessment & Plan Note (Signed)
Stable kidney function tests.  Follows up with nephrologist on a regular basis.

## 2021-10-21 ENCOUNTER — Encounter (HOSPITAL_BASED_OUTPATIENT_CLINIC_OR_DEPARTMENT_OTHER): Payer: Self-pay | Admitting: Obstetrics & Gynecology

## 2021-10-21 NOTE — Progress Notes (Signed)
Spoke w/ via phone for pre-op interview--- [facility] needs dos----  NONE             Lab results------ Pre op labs appointment 10/25/21. Current EKG dated 10/14/21 in Epic. COVID test -----patient states asymptomatic no test needed Arrive at -------1100 NPO after MN NO Solid Food.  Clear liquids from MN until---1000 Med rec completed Medications to take morning of surgery ----- Norvasc, Coreg, myfortic and Prograf. Diabetic medication ----- Patient instructed no nail polish to be worn day of surgery Patient instructed to bring photo id and insurance card day of surgery Patient aware to have Driver (ride ) / caregiver  daughter Ernie Hew   for 24 hours after surgery  Patient Special Instructions ----- Pre-Op special Istructions ----- Patient verbalized understanding of instructions that were given at this phone interview. Patient denies shortness of breath, chest pain, fever, cough at this phone interview.

## 2021-10-22 IMAGING — DX DG CHEST 2V
2 series · 2 of 2 positions shown · non-contrast
Comparison: None.

CLINICAL DATA: Chest pain and shortness of breath

EXAM:
CHEST - 2 VIEW

[w chest pa]
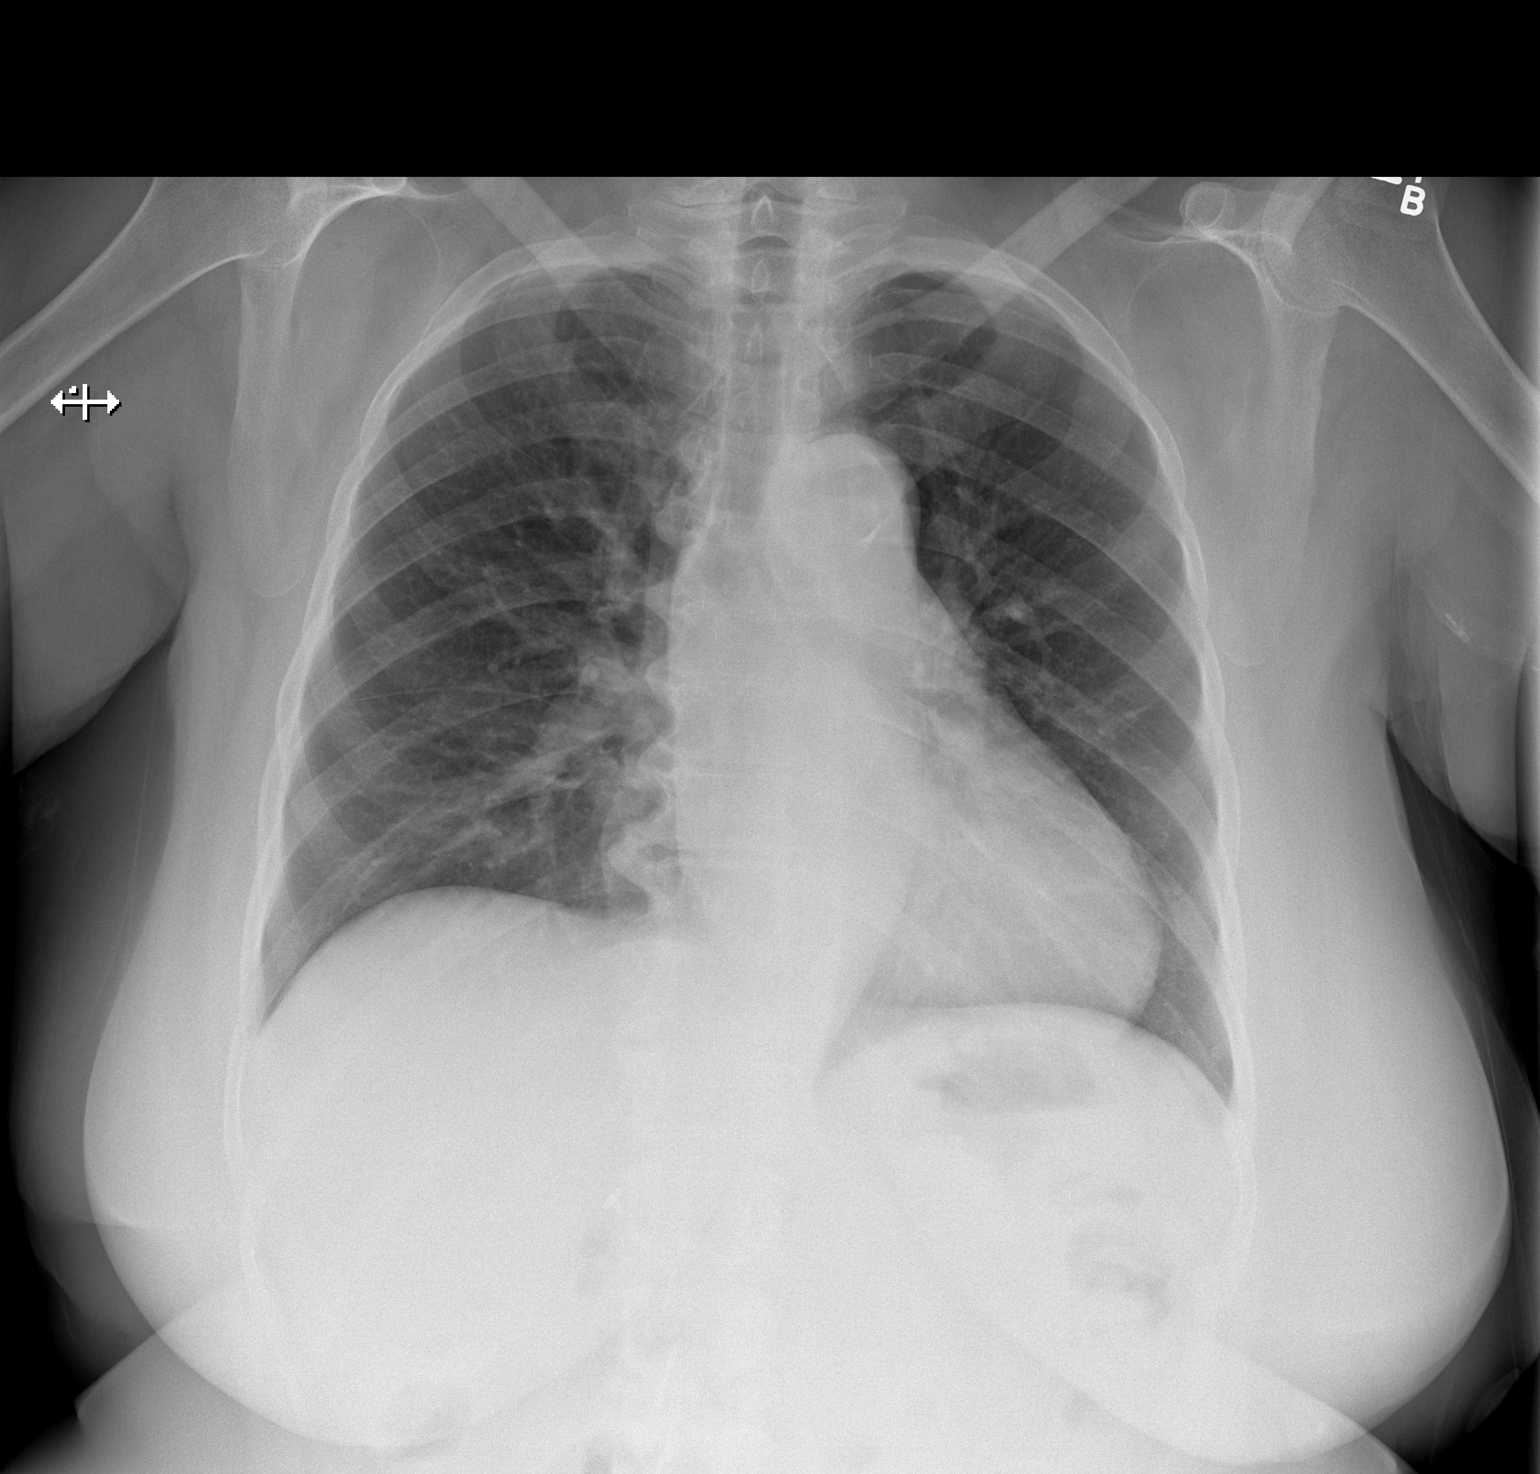

[w chest lat]
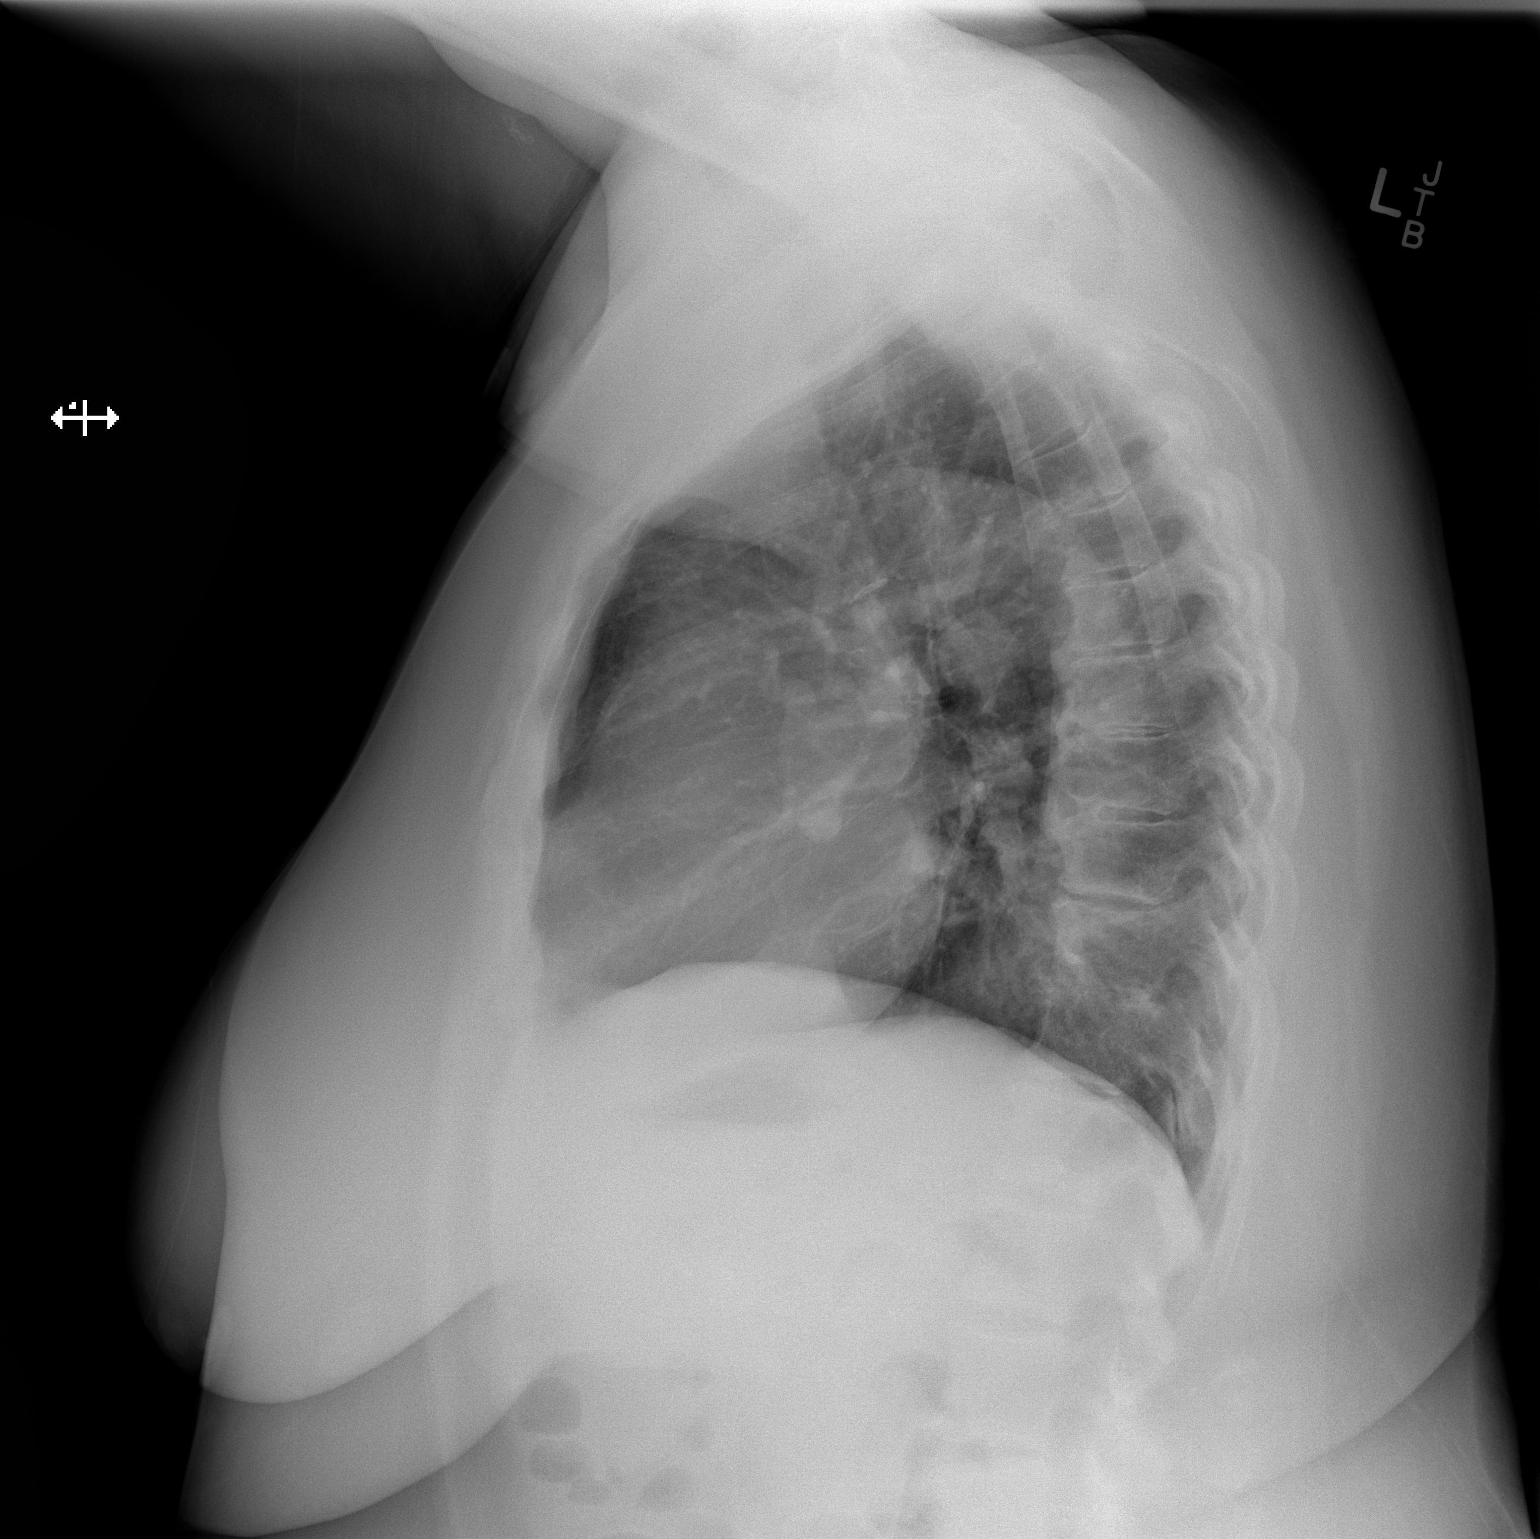

[2 of 2 positions shown; findings below may reference images not displayed]

FINDINGS: The heart size and mediastinal contours are within normal limits.
Aortic knob calcifications. Both lungs are clear. The visualized
skeletal structures are unremarkable.
IMPRESSION: No active cardiopulmonary disease.

## 2021-10-25 ENCOUNTER — Encounter (HOSPITAL_COMMUNITY)
Admission: RE | Admit: 2021-10-25 | Discharge: 2021-10-25 | Disposition: A | Discharge status: Home / Self Care | Payer: Medicare Other | Source: Ambulatory Visit | Attending: Obstetrics & Gynecology | Admitting: Obstetrics & Gynecology

## 2021-10-25 DIAGNOSIS — N83299 Other ovarian cyst, unspecified side: Secondary | ICD-10-CM | POA: Diagnosis not present

## 2021-10-25 DIAGNOSIS — Z01812 Encounter for preprocedural laboratory examination: Secondary | ICD-10-CM | POA: Diagnosis present

## 2021-10-25 LAB — CBC
HCT: 40.7 % (ref 36.0–46.0)
Hemoglobin: 12.9 g/dL (ref 12.0–15.0)
MCH: 28.9 pg (ref 26.0–34.0)
MCHC: 31.7 g/dL (ref 30.0–36.0)
MCV: 91.3 fL (ref 80.0–100.0)
Platelets: 218 10*3/uL (ref 150–400)
RBC: 4.46 MIL/uL (ref 3.87–5.11)
RDW: 14.9 % (ref 11.5–15.5)
WBC: 5.6 10*3/uL (ref 4.0–10.5)
nRBC: 0 % (ref 0.0–0.2)

## 2021-10-25 LAB — COMPREHENSIVE METABOLIC PANEL
ALT: 65 U/L — ABNORMAL HIGH (ref 0–44)
AST: 31 U/L (ref 15–41)
Albumin: 3.1 g/dL — ABNORMAL LOW (ref 3.5–5.0)
Alkaline Phosphatase: 52 U/L (ref 38–126)
Anion gap: 8 (ref 5–15)
BUN: 26 mg/dL — ABNORMAL HIGH (ref 8–23)
CO2: 25 mmol/L (ref 22–32)
Calcium: 8.9 mg/dL (ref 8.9–10.3)
Chloride: 108 mmol/L (ref 98–111)
Creatinine, Ser: 1.72 mg/dL — ABNORMAL HIGH (ref 0.44–1.00)
GFR, Estimated: 32 mL/min — ABNORMAL LOW (ref >60)
Glucose, Bld: 165 mg/dL — ABNORMAL HIGH (ref 70–99)
Potassium: 3.9 mmol/L (ref 3.5–5.1)
Sodium: 141 mmol/L (ref 135–145)
Total Bilirubin: 0.7 mg/dL (ref 0.3–1.2)
Total Protein: 6.3 g/dL — ABNORMAL LOW (ref 6.5–8.1)

## 2021-10-25 NOTE — Progress Notes (Signed)
I called Dr. Fonnie Jarvis office and spoke with Janit Pagan, surgery scheduler. I asked her to please let Dr. Alesia Richards know that the patient's creatinine was 1.72 on 10/25/21.

## 2021-10-26 NOTE — H&P (Signed)
Megan Bradford is an 71 y.o. female Para 2 with a complex right ovarian cyst, pelvic pain, endometrial lesion (suspect polyp) and vulvar lesion here for laparoscopic bilateral salpingo-oophorectomy, dilation and curettage hysteroscopy with possible myosure and vulvar warts excision.  Patient also has a history of hypertension, high cholesterol, right kidney transplant and has received medical clearance from PCP to undergo the procedures.   Pertinent Gynecological History: Menses: post-menopausal Bleeding: None Contraception: post menopausal status DES exposure: unknown Blood transfusions: none Sexually transmitted diseases:None Previous GYN Procedures:   D &C hysteroscopy, endometrial ablation.    Last mammogram: normal Date: 07/26/21 Last pap: normal Date: 03/14/21: Neg/neg  Menstrual History: No LMP recorded. Patient is postmenopausal.    Past Medical History:  Diagnosis Date   Cataract    Phreesia 11/24/2019- removed both   Chronic kidney disease    [employer]reesia 11/24/2019   Clotting disorder ([place])    PE 2010 per pt there really was not a PE   Heart murmur    Hypertension    Phreesia 11/24/2019   Kidney transplanted    Pre-diabetes    no meds   Pulmonary embolism ([place]) 2010    Past Surgical History:  Procedure Laterality Date   ANAL RECTAL MANOMETRY N/A 08/03/2021   Procedure: ANO RECTAL MANOMETRY;  Surgeon: Thornton Park, MD;  Location: [facility];  Service: Gastroenterology;  Laterality: N/A;   CARDIAC CATHETERIZATION     x2   CESAREAN SECTION N/A    [employer]reesia 11/24/2019   CHOLECYSTECTOMY N/A    [employer]reesia 11/24/2019   COLONOSCOPY     DILATION AND CURETTAGE OF UTERUS     EYE SURGERY N/A    Cataracts removed [employer]ia 11/24/2019   HIP SURGERY Left 1990   Hip dysplasia cleaned out per patient   KIDNEY TRANSPLANT     POLYPECTOMY     HPP   TONSILLECTOMY     TRANSFORAMINAL LUMBAR INTERBODY FUSION (TLIF) WITH PEDICLE SCREW FIXATION 1 LEVEL N/A 12/01/2020    Procedure: Transiforaminal Lumbar Interbody Fusion Lumbar four--Lumbar five;  Surgeon: Vallarie Mare, MD;  Location: [facility];  Service: Neurosurgery;  Laterality: N/A;   TRIGGER FINGER RELEASE Right 10/28/2020   Long finger    Family History  Problem Relation Age of Onset   Diabetes Mother    Asthma Mother    Hypertension Father    Coronary artery disease Father    Colon cancer Neg Hx    Colon polyps Neg Hx    Esophageal cancer Neg Hx    Rectal cancer Neg Hx    Stomach cancer Neg Hx     Social History:  reports that she has never smoked. She has never used smokeless tobacco. She reports that she does not drink alcohol and does not use drugs.  Allergies:  Allergies  Allergen Reactions   Keflex [Cephalexin] Anaphylaxis   Ibuprofen     Raises blood pressure; pt states this is not correct   Xanax [Alprazolam] Other (See Comments)    pt states this is not correct     Current Outpatient Medications  Medication Instructions   acetaminophen (TYLENOL) 650 mg, Oral, Every 8 hours PRN   amLODipine (NORVASC) 5 mg, Oral, Daily   aspirin EC 81 mg, Oral, Daily, Swallow whole.    carvedilol (COREG) 12.5 mg, Oral, 2 times daily with meals   cetirizine (ZYRTEC) 10 mg, Oral, Daily   Coenzyme Q10 (CO Q10 PO) 1 capsule, Oral, Daily   diclofenac Sodium (VOLTAREN) 2 g, Topical, 4 times  daily   lisinopril (ZESTRIL) 5 mg, Oral, Daily   magnesium oxide (MAG-OX) 400 mg, Oral, Daily   multivitamin-lutein (OCUVITE-LUTEIN) CAPS capsule 1 capsule, Oral, Daily   mycophenolate (MYFORTIC) 180 mg, Oral, 2 times daily   ondansetron (ZOFRAN) 4 mg, Oral, Every 8 hours PRN   Prevagen 10 mg, Oral, Daily   rosuvastatin (CRESTOR) 20 mg, Oral, Daily   tacrolimus (PROGRAF) 2-3 mg, Oral, See admin instructions, Take 3 mg by mouth in the morning and 2 mg in the evening   Vitamin D3 400 Units, Oral, Daily     Review of Systems  Constitutional: Denies fevers/chills Cardiovascular: Denies chest pain or  palpitations Pulmonary: Denies coughing or wheezing Gastrointestinal: Denies nausea, vomiting or diarrhea Genitourinary: With pelvic pain.  Denies unusual vaginal bleeding, unusual vaginal discharge, dysuria, urgency or frequency.  Musculoskeletal: Denies muscle or joint aches and pain.  Neurology: Denies abnormal sensations such as tingling or numbness.    Height 5' (1.524 m), weight 95.3 kg. Physical Exam Blood pressure (!) 142/87, pulse 65, temperature 98.7 F (37.1 C), temperature source Oral, resp. rate 16, height 5' (1.524 m), weight 95.1 kg, SpO2 95 %.  Constitutional: She is oriented to person, place, and time. She appears well-developed and well-nourished.  HENT:  Head: Normocephalic and atraumatic.  Neck: Normal range of motion.  Cardiovascular: Normal rate, regular rhythm and normal heart sounds.   Respiratory: Effort normal and breath sounds normal.  GI: Soft. Bowel sounds are normal.  Genitourinary: Tender to palpation on bilateral pelvic areas, no cervical motion tenderness.  Right labia majora with 2 velvety hyperpigmented patches, 1.5 cm and cm. Neurological: She is alert and oriented to person, place, and time.  Skin: Skin is warm and dry.  Psychiatric: She has a normal mood and affect. Her behavior is normal.    CBC    Component Value Date/Time   WBC 5.6 10/25/2021 0843   RBC 4.46 10/25/2021 0843   HGB 12.9 10/25/2021 0843   HGB 13.6 11/27/2019 1019   HCT 40.7 10/25/2021 0843   HCT 41.3 11/27/2019 1019   PLT 218 10/25/2021 0843   PLT 263 11/27/2019 1019   MCV 91.3 10/25/2021 0843   MCV 86 11/27/2019 1019   MCH 28.9 10/25/2021 0843   MCHC 31.7 10/25/2021 0843   RDW 14.9 10/25/2021 0843   RDW 13.5 11/27/2019 1019   LYMPHSABS 1.5 10/14/2021 0142   LYMPHSABS 1.5 11/27/2019 1019   MONOABS 0.8 10/14/2021 0142   EOSABS 0.2 10/14/2021 0142   EOSABS 0.1 11/27/2019 1019   BASOSABS 0.1 10/14/2021 0142   BASOSABS 0.1 11/27/2019 1019       Latest Ref Rng & Units  10/25/2021    8:43 AM 10/14/2021    1:42 AM 10/13/2021    5:19 PM  CMP  Glucose 70 - 99 mg/dL 165  183  127   BUN 8 - 23 mg/dL '26  23  22   '$ Creatinine 0.44 - 1.00 mg/dL 1.72  1.83  1.87   Sodium 135 - 145 mmol/L 141  140  139   Potassium 3.5 - 5.1 mmol/L 3.9  4.0  4.8   Chloride 98 - 111 mmol/L 108  111  107   CO2 22 - 32 mmol/L '25  21  26   '$ Calcium 8.9 - 10.3 mg/dL 8.9  8.7  9.3   Total Protein 6.5 - 8.1 g/dL 6.3  5.6    Total Bilirubin 0.3 - 1.2 mg/dL 0.7  0.8    Alkaline  Phos 38 - 126 U/L 52  45    AST 15 - 41 U/L 31  17    ALT 0 - 44 U/L 65  9      O POS   Pelvic ultrasound 07/04/21: Uterus 5.2 x 3.8 x 3.5 cm . Retroverted. EMS 8.7 mm.cystic structure 1.4 x 1.0 cm with feeder vessel, possible polyp. Right ovary 2.3 x 2.4 x 2.2 cm cystic lesion with low level internal echoes.  Suboptimal left ovary views.  No free fluid.   Pelvic ultrasound 04/25/21: Retroverted uterus 6.8 cm.  Hyperechoic structure at fundus superior to fluid collection measures 1.1 x 0.8 x 0.7 cm, possible polyp. Thick fundal endometrium versus post surgical changes.   Right ovary with 2.9 x 2.3 x 2.4 cm cystic structure with low level internal echoes.  Left ovary not seen. Right adnexa with transplant kidney.   Vulvar biopsy: 06/01/21: Condyloma acuminata, negative for  HGSIL.   Ca 125 05/30/21: 11.6 (normal).   Assessment/Plan: 70 y/o here for laparoscopic bilateral salpingo-oophorectomy, dilation and curettage hysteroscopy with possible myosure and vulvar warts excision,  - Admit to [facility]. -This procedure has been fully reviewed with the patient and written informed consent has been obtained.  - We discussed risks, benefits and alternatives of the procedure that includes but not limited to risks of bleeding, infection, damage to organs and blood vessels and possible need for additional surgeries.  All her questions were answered and she expressed understanding.   Archie Endo, MD.   10/26/2021, 5:34 PM

## 2021-10-27 NOTE — Op Note (Addendum)
Name: Megan Bradford DOB: Oct 10, 1950 MRN: [id number] Date of procedure: 10/28/2021   PREOP DIAGNOSIS: 1.  Complex right ovarian cyst. 2. Pelvic pain. 3. Thickened endometrial stripe with endometrial lesion, suspect endometrial polyp. 4. Vulvar condyloma acuminata.  5. History of right kidney transplant for chronic kidney disease.  6.  Cervical stenosis.  7. BMI 40.95.     POSTOP DIAGNOSIS: Same as above.   PROCEDURES:  Laparoscopic bilateral salpingo-oophorectomy.  Dilation and Curettage Hysteroscopy. Vulvar warts excision.  Pelvic washings.    SURGEON: Dr. Waymon Amato.   ASSISTANT: Dr. Sanjuana Kava (for the laparoscopy portion) and no assistant for the hysteroscopy and vulvar lesions excision.  ATTENDING ATTESTATION: I was present and scrubbed and performed the procedure and the assistant was required due to the complexity of anatomy.    ANESTHESIA: General ETA (Dr. Daiva Huge, Curt Bears) and marcaine with epinephrine for local and paracervical instillations by surgeon, total used 25 cc.     COMPLICATIONS: Uterine perforation with Hulka tip manipulator.     EBL:50  mL.   IV FLUID:  1000 mL LR.   URINE OUTPUT: 25 mL.  Fluid deficit from diagnostic hysteroscopy: 30cc.   LOCAL ANESTHESIA:    FINDINGS: Uterus with Hulka manipulator tip perforating fundus.  Normal left ovary.  Right ovary with 3 cm cyst.  Normal left and right fallopian tubes. Right labia majora with 1cm and 1.5 cm velvety raised lesions on posterior aspect near introitus    PROCEDURES:    Informed consent was obtained from the patient to undergo the procedures after discussing the risks benefits and alternatives of the procedures. She was taken to the operating room where anesthesia was administered without difficulty. An exam was then performed under anesthesia revealing a small uterus and a closed cervix. There were no palpable adnexal masses. Both arms were tucked and she was placed in the dorsal lithotomy position.  She was prepped abdominally, vaginally and perineum in the usual sterile fashion. Foley catheter was placed in the bladder and hulka uterine manipulator was placed in after sounding uterus to 6 cm .     Patient was placed in modified dorsal lithotomy position and attention was then turned to the abdomen where marcaine with epinephrine was instilled in the infraumbilical area. The skin was incised with a scalpel and and entry into the abdomen was made with direct visualization using the 12 mm Ethicon XCEL long trocar with optiview (regular 44m XCEL did not traverse through all the abdominal layers).  Correct entry was confirmed with visualization of omentum and bowel.  Gas was connected and pneumoperitoneum achieved, abdominal pressure maintained at 171mg.  The scope was then placed in, patient placed in trendelenburg and the pelvis was visualized. Two 5 mm ports were placed on left and right lower abdominal quadrants after marcaine with epinephrine instillation, 2FB medial and superior to the ASIS and lateral to the inferior epigastrics using 5 mm XCEL long trocars.  The bowel was moved out of the way. With the bowel on the way the transplant kidney hump was not visualized in the right pelvis.  However uterine perforation at the fundus with the blunt 3.5 mm hulka tip was noted.  There was minimal bleeding from the fundal perforation and the hulka tip was retracted into the uterus.  The ureters were visualized bilaterally on the pelvic side walls.  The appendix appeared normal. Pelvic washings were obtained.  The left infundibulopelvic (IP) ligament was ligated and transected with the [id number] applied close to the ovary.  The IP transection was extended to below the left fallopian tube on mesosalpinx and the mesosalpinx was ligated and transected with the ligasure to near the uterine cornua.  The uteroovarian ligament was similarly transected with the ligasure and the specimen placed in the posterior cul de  sac.  The right fallopian tube was removed starting with ligating the IP, then transecting the utero-ovarian ligament,  then down the mesosalpinx below the tube.  The specimen was placed in the posterior cul de sac.  The specimen was then removed with the 10 mm endo catch bag through the 12 mm port. The 12 mm port had to be extended at the skin and fascia level to about 2.5cm total to allow complete removal of the specimen.  The pelvis was inspected and it was noted to be hemostatic.   The two lower lateral ports were removed under direct visualization.  Gas was turned off and allowed to escape. She was given 5 manual inflations by anesthesia and taken out of trendelenburg.  The umbilical port was removed with visualization.  The umbilical fascia was identified and grasped with kochers.  Umbilical incision fascia was closed using 0 Vicryl  Suture on UR 6 needle, subcutaneous layer closed with 2-0 plain and the skin was closed with 4-0 Monocryl. The 2 side ports incisions were also closed with 4-0 Monocryl. Dermabond was applied over the incisions and op site placed over the umbilicus incision.  The patient was once again placed in dorsal lithotomy position and attention turned to the vagina.  The Hulka manipulator was removed.    A graves speculum was used to view the cervix. Single-tooth tenaculum was placed on the anterior cervix. The cervix was dilated to #13 pratt dilators and the diagnostic cope was placed in.  The cavity was inspected and noted to be atrophic and possible small polyp on the right side of her uterus.  Bilateral tubal ostia were noted during the procedure.The uterine perforation at the fundus was noted and was without significant bleeding.  The hysteroscope was removed and gentle sharp curettage performed ensuring to stay away from the fundus.  Fluid deficit from the hysteroscope procedure was 30 cc.  Paracervical block was given with marcaine with epinephrine.  All remaining instruments were  then removed and attention turned to the labia.  The two lesions on the right labia majora were identified and marcaine with epinephrine instilled below the lesions.  The vulvar warts were excised with a scalpel in an elliptical incision and the skin closed off with 4-0 monocryl.  The patient tolerated the procedure well and she was was awoken from anesthesia and taken to recovery room in stable condition  Specimens:  1.Left and right ovaries and fallopian tubes. .  2. Endometrial curettings. 3. Vulvar lesions consistent with condyloma acuminata.    Disposition: Stable to PACU.     Eann Cleland, MD.   10/28/2021. 5:11 PM.

## 2021-10-28 ENCOUNTER — Encounter (HOSPITAL_BASED_OUTPATIENT_CLINIC_OR_DEPARTMENT_OTHER)
Admission: RE | Disposition: A | Discharge status: Home / Self Care | Payer: Self-pay | Source: Home / Self Care | Attending: Obstetrics & Gynecology

## 2021-10-28 ENCOUNTER — Other Ambulatory Visit: Payer: Self-pay

## 2021-10-28 ENCOUNTER — Ambulatory Visit (HOSPITAL_BASED_OUTPATIENT_CLINIC_OR_DEPARTMENT_OTHER): Payer: Medicare Other | Admitting: Anesthesiology

## 2021-10-28 ENCOUNTER — Encounter (HOSPITAL_BASED_OUTPATIENT_CLINIC_OR_DEPARTMENT_OTHER): Payer: Self-pay | Admitting: Obstetrics & Gynecology

## 2021-10-28 ENCOUNTER — Ambulatory Visit (HOSPITAL_BASED_OUTPATIENT_CLINIC_OR_DEPARTMENT_OTHER)
Admission: RE | Admit: 2021-10-28 | Discharge: 2021-10-28 | Disposition: A | Discharge status: Home / Self Care | Payer: Medicare Other | Attending: Obstetrics & Gynecology | Admitting: Obstetrics & Gynecology

## 2021-10-28 DIAGNOSIS — Y763 Surgical instruments, materials and obstetric and gynecological devices (including sutures) associated with adverse incidents: Secondary | ICD-10-CM | POA: Diagnosis not present

## 2021-10-28 DIAGNOSIS — E785 Hyperlipidemia, unspecified: Secondary | ICD-10-CM | POA: Diagnosis not present

## 2021-10-28 DIAGNOSIS — Y92234 Operating room of hospital as the place of occurrence of the external cause: Secondary | ICD-10-CM | POA: Diagnosis not present

## 2021-10-28 DIAGNOSIS — I132 Hypertensive heart and chronic kidney disease with heart failure and with stage 5 chronic kidney disease, or end stage renal disease: Secondary | ICD-10-CM | POA: Diagnosis not present

## 2021-10-28 DIAGNOSIS — Z6841 Body Mass Index (BMI) 40.0 and over, adult: Secondary | ICD-10-CM | POA: Insufficient documentation

## 2021-10-28 DIAGNOSIS — N83291 Other ovarian cyst, right side: Secondary | ICD-10-CM

## 2021-10-28 DIAGNOSIS — R102 Pelvic and perineal pain: Secondary | ICD-10-CM | POA: Diagnosis not present

## 2021-10-28 DIAGNOSIS — I2510 Atherosclerotic heart disease of native coronary artery without angina pectoris: Secondary | ICD-10-CM

## 2021-10-28 DIAGNOSIS — I110 Hypertensive heart disease with heart failure: Secondary | ICD-10-CM | POA: Diagnosis not present

## 2021-10-28 DIAGNOSIS — N186 End stage renal disease: Secondary | ICD-10-CM | POA: Diagnosis not present

## 2021-10-28 DIAGNOSIS — N882 Stricture and stenosis of cervix uteri: Secondary | ICD-10-CM | POA: Diagnosis not present

## 2021-10-28 DIAGNOSIS — I509 Heart failure, unspecified: Secondary | ICD-10-CM | POA: Insufficient documentation

## 2021-10-28 DIAGNOSIS — N83299 Other ovarian cyst, unspecified side: Secondary | ICD-10-CM

## 2021-10-28 DIAGNOSIS — A630 Anogenital (venereal) warts: Secondary | ICD-10-CM | POA: Diagnosis not present

## 2021-10-28 DIAGNOSIS — S3769XA Other injury of uterus, initial encounter: Secondary | ICD-10-CM | POA: Insufficient documentation

## 2021-10-28 DIAGNOSIS — Z940 Kidney transplant status: Secondary | ICD-10-CM | POA: Diagnosis not present

## 2021-10-28 DIAGNOSIS — Y838 Other surgical procedures as the cause of abnormal reaction of the patient, or of later complication, without mention of misadventure at the time of the procedure: Secondary | ICD-10-CM | POA: Insufficient documentation

## 2021-10-28 HISTORY — DX: Other specified disorders of the skin and subcutaneous tissue: L98.8

## 2021-10-28 HISTORY — PX: LAPAROSCOPIC SALPINGO OOPHERECTOMY: SHX5927

## 2021-10-28 HISTORY — PX: DILATATION & CURETTAGE/HYSTEROSCOPY WITH MYOSURE: SHX6511

## 2021-10-28 HISTORY — PX: VULVAR LESION REMOVAL: SHX5391

## 2021-10-28 LAB — TYPE AND SCREEN
ABO/RH(D): O POS
Antibody Screen: NEGATIVE

## 2021-10-28 SURGERY — DILATATION & CURETTAGE/HYSTEROSCOPY WITH MYOSURE
Anesthesia: General | Site: Vagina

## 2021-10-28 MED ORDER — 0.9 % SODIUM CHLORIDE (POUR BTL) OPTIME
TOPICAL | Status: DC | PRN
  Administered 2021-10-28: 500 mL

## 2021-10-28 MED ORDER — MIDAZOLAM HCL 2 MG/2ML IJ SOLN
INTRAMUSCULAR | Status: DC | PRN
  Administered 2021-10-28: 1 mg via INTRAVENOUS

## 2021-10-28 MED ORDER — FENTANYL CITRATE (PF) 250 MCG/5ML IJ SOLN
INTRAMUSCULAR | Status: DC | PRN
  Administered 2021-10-28: 100 ug via INTRAVENOUS
  Administered 2021-10-28 (×3): 25 ug via INTRAVENOUS

## 2021-10-28 MED ORDER — ROCURONIUM BROMIDE 10 MG/ML (PF) SYRINGE
PREFILLED_SYRINGE | INTRAVENOUS | Status: AC
  Filled 2021-10-28: qty 10

## 2021-10-28 MED ORDER — POVIDONE-IODINE 10 % EX SWAB: 2.0000 | Freq: Once | CUTANEOUS | Status: DC

## 2021-10-28 MED ORDER — OXYCODONE HCL 5 MG PO TABS: 5.0000 mg | ORAL_TABLET | ORAL | 0 refills | Status: DC | PRN

## 2021-10-28 MED ORDER — SODIUM CHLORIDE 0.9 % IV SOLN: INTRAVENOUS | Status: DC

## 2021-10-28 MED ORDER — BUPIVACAINE-EPINEPHRINE 0.25% -1:200000 IJ SOLN
INTRAMUSCULAR | Status: DC | PRN
  Administered 2021-10-28: 22 mL
  Administered 2021-10-28: 10 mL

## 2021-10-28 MED ORDER — DEXAMETHASONE SODIUM PHOSPHATE 10 MG/ML IJ SOLN
INTRAMUSCULAR | Status: AC
  Filled 2021-10-28: qty 2

## 2021-10-28 MED ORDER — SOD CITRATE-CITRIC ACID 500-334 MG/5ML PO SOLN: 30.0000 mL | ORAL | Status: DC

## 2021-10-28 MED ORDER — BENZOCAINE-MENTHOL 20-0.5 % EX AERO: 1.0000 | INHALATION_SPRAY | Freq: Four times a day (QID) | CUTANEOUS | 0 refills | Status: DC | PRN

## 2021-10-28 MED ORDER — ROCURONIUM BROMIDE 10 MG/ML (PF) SYRINGE
PREFILLED_SYRINGE | INTRAVENOUS | Status: DC | PRN
  Administered 2021-10-28: 10 mg via INTRAVENOUS
  Administered 2021-10-28: 60 mg via INTRAVENOUS
  Administered 2021-10-28: 20 mg via INTRAVENOUS

## 2021-10-28 MED ORDER — CARVEDILOL 12.5 MG PO TABS
12.5000 mg | ORAL_TABLET | ORAL | Status: AC
  Administered 2021-10-28: 12.5 mg via ORAL
  Filled 2021-10-28: qty 1

## 2021-10-28 MED ORDER — BENZOCAINE-MENTHOL 20-0.5 % EX AERO: 1.0000 | INHALATION_SPRAY | Freq: Four times a day (QID) | CUTANEOUS | Status: DC | PRN

## 2021-10-28 MED ORDER — FENTANYL CITRATE (PF) 100 MCG/2ML IJ SOLN
INTRAMUSCULAR | Status: AC
  Filled 2021-10-28: qty 2

## 2021-10-28 MED ORDER — SUGAMMADEX SODIUM 200 MG/2ML IV SOLN
INTRAVENOUS | Status: DC | PRN
  Administered 2021-10-28: 200 mg via INTRAVENOUS

## 2021-10-28 MED ORDER — ROCURONIUM BROMIDE 10 MG/ML (PF) SYRINGE
PREFILLED_SYRINGE | INTRAVENOUS | Status: AC
  Filled 2021-10-28: qty 30

## 2021-10-28 MED ORDER — KETOROLAC TROMETHAMINE 30 MG/ML IJ SOLN
INTRAMUSCULAR | Status: AC
  Filled 2021-10-28: qty 1

## 2021-10-28 MED ORDER — PROPOFOL 10 MG/ML IV BOLUS
INTRAVENOUS | Status: AC
  Filled 2021-10-28: qty 20

## 2021-10-28 MED ORDER — ACETAMINOPHEN 500 MG PO TABS
ORAL_TABLET | ORAL | Status: AC
  Filled 2021-10-28: qty 2

## 2021-10-28 MED ORDER — ONDANSETRON HCL 4 MG/2ML IJ SOLN
INTRAMUSCULAR | Status: AC
  Filled 2021-10-28: qty 2

## 2021-10-28 MED ORDER — ONDANSETRON HCL 4 MG/2ML IJ SOLN
INTRAMUSCULAR | Status: DC | PRN
  Administered 2021-10-28: 4 mg via INTRAVENOUS

## 2021-10-28 MED ORDER — PHENYLEPHRINE 80 MCG/ML (10ML) SYRINGE FOR IV PUSH (FOR BLOOD PRESSURE SUPPORT)
PREFILLED_SYRINGE | INTRAVENOUS | Status: DC | PRN
  Administered 2021-10-28 (×3): 80 ug via INTRAVENOUS

## 2021-10-28 MED ORDER — SODIUM CHLORIDE 0.9 % IR SOLN
Status: DC | PRN
  Administered 2021-10-28: 50 mL

## 2021-10-28 MED ORDER — LIDOCAINE 2% (20 MG/ML) 5 ML SYRINGE
INTRAMUSCULAR | Status: DC | PRN
  Administered 2021-10-28: 100 mg via INTRAVENOUS

## 2021-10-28 MED ORDER — LIDOCAINE HCL (PF) 2 % IJ SOLN
INTRAMUSCULAR | Status: AC
  Filled 2021-10-28: qty 5

## 2021-10-28 MED ORDER — OXYCODONE HCL 5 MG/5ML PO SOLN: 5.0000 mg | Freq: Once | ORAL | Status: DC | PRN

## 2021-10-28 MED ORDER — FENTANYL CITRATE (PF) 100 MCG/2ML IJ SOLN: 25.0000 ug | INTRAMUSCULAR | Status: DC | PRN

## 2021-10-28 MED ORDER — SILVER NITRATE-POT NITRATE 75-25 % EX MISC
CUTANEOUS | Status: DC | PRN
  Administered 2021-10-28: 2

## 2021-10-28 MED ORDER — DEXAMETHASONE SODIUM PHOSPHATE 10 MG/ML IJ SOLN
INTRAMUSCULAR | Status: DC | PRN
  Administered 2021-10-28: 5 mg via INTRAVENOUS

## 2021-10-28 MED ORDER — ACETAMINOPHEN 500 MG PO TABS
1000.0000 mg | ORAL_TABLET | Freq: Once | ORAL | Status: AC
  Administered 2021-10-28: 1000 mg via ORAL

## 2021-10-28 MED ORDER — ONDANSETRON HCL 4 MG/2ML IJ SOLN
INTRAMUSCULAR | Status: AC
  Filled 2021-10-28: qty 4

## 2021-10-28 MED ORDER — LIDOCAINE HCL (PF) 2 % IJ SOLN
INTRAMUSCULAR | Status: AC
  Filled 2021-10-28: qty 10

## 2021-10-28 MED ORDER — LACTATED RINGERS IV SOLN: INTRAVENOUS | Status: DC

## 2021-10-28 MED ORDER — AMLODIPINE BESYLATE 5 MG PO TABS
5.0000 mg | ORAL_TABLET | ORAL | Status: AC
Start: 2021-10-28 — End: 2021-10-28
  Administered 2021-10-28: 5 mg via ORAL
  Filled 2021-10-28: qty 1

## 2021-10-28 MED ORDER — PROPOFOL 10 MG/ML IV BOLUS
INTRAVENOUS | Status: DC | PRN
  Administered 2021-10-28: 200 mg via INTRAVENOUS

## 2021-10-28 MED ORDER — DEXAMETHASONE SODIUM PHOSPHATE 10 MG/ML IJ SOLN
INTRAMUSCULAR | Status: AC
  Filled 2021-10-28: qty 1

## 2021-10-28 MED ORDER — PHENYLEPHRINE 80 MCG/ML (10ML) SYRINGE FOR IV PUSH (FOR BLOOD PRESSURE SUPPORT)
PREFILLED_SYRINGE | INTRAVENOUS | Status: AC
  Filled 2021-10-28: qty 30

## 2021-10-28 MED ORDER — OXYCODONE HCL 5 MG PO TABS: 5.0000 mg | ORAL_TABLET | Freq: Once | ORAL | Status: DC | PRN

## 2021-10-28 MED ORDER — MIDAZOLAM HCL 2 MG/2ML IJ SOLN
INTRAMUSCULAR | Status: AC
  Filled 2021-10-28: qty 2

## 2021-10-28 SURGICAL SUPPLY — 71 items
ADH SKN CLS APL DERMABOND .7 (GAUZE/BANDAGES/DRESSINGS) ×2
APL SKNCLS STERI-STRIP NONHPOA (GAUZE/BANDAGES/DRESSINGS)
APL SRG 38 LTWT LNG FL B (MISCELLANEOUS)
APL SWBSTK 6 STRL LF DISP (MISCELLANEOUS) ×2
APPLICATOR ARISTA FLEXITIP XL (MISCELLANEOUS) IMPLANT
APPLICATOR COTTON TIP 6 STRL (MISCELLANEOUS) IMPLANT
APPLICATOR COTTON TIP 6IN STRL (MISCELLANEOUS) ×3
BAG RETRIEVAL 10 (BASKET) ×1
BENZOIN TINCTURE PRP APPL 2/3 (GAUZE/BANDAGES/DRESSINGS) IMPLANT
BLADE SURG 15 STRL LF DISP TIS (BLADE) IMPLANT
BLADE SURG 15 STRL SS (BLADE) ×3
CABLE HIGH FREQUENCY MONO STRZ (ELECTRODE) IMPLANT
CATH ROBINSON RED A/P 16FR (CATHETERS) ×2 IMPLANT
CNTNR URN SCR LID CUP LEK RST (MISCELLANEOUS) IMPLANT
CONT SPEC 4OZ STRL OR WHT (MISCELLANEOUS) ×3
COVER MAYO STAND STRL (DRAPES) ×3 IMPLANT
DECANTER SPIKE VIAL GLASS SM (MISCELLANEOUS) ×2 IMPLANT
DERMABOND ADVANCED (GAUZE/BANDAGES/DRESSINGS) ×1
DERMABOND ADVANCED .7 DNX12 (GAUZE/BANDAGES/DRESSINGS) IMPLANT
DEVICE MYOSURE LITE (MISCELLANEOUS) IMPLANT
DEVICE MYOSURE REACH (MISCELLANEOUS) IMPLANT
DILATOR CANAL MILEX (MISCELLANEOUS) IMPLANT
DRSG OPSITE POSTOP 3X4 (GAUZE/BANDAGES/DRESSINGS) IMPLANT
DRSG TELFA 3X8 NADH (GAUZE/BANDAGES/DRESSINGS) ×6 IMPLANT
DURAPREP 26ML APPLICATOR (WOUND CARE) ×3 IMPLANT
ELECT REM PT RETURN 9FT ADLT (ELECTROSURGICAL) ×3
ELECTRODE REM PT RTRN 9FT ADLT (ELECTROSURGICAL) ×2 IMPLANT
GAUZE 4X4 16PLY ~~LOC~~+RFID DBL (SPONGE) ×6 IMPLANT
GLOVE BIO SURGEON STRL SZ7 (GLOVE) ×3 IMPLANT
GLOVE BIOGEL PI IND STRL 7.0 (GLOVE) ×4 IMPLANT
GLOVE BIOGEL PI INDICATOR 7.0 (GLOVE) ×2
GLOVE SURG SS PI 6.5 STRL IVOR (GLOVE) ×6 IMPLANT
GOWN STRL REUS W/TWL LRG LVL3 (GOWN DISPOSABLE) ×6 IMPLANT
HEMOSTAT ARISTA ABSORB 3G PWDR (HEMOSTASIS) IMPLANT
KIT PROCEDURE FLUENT (KITS) ×3 IMPLANT
KIT TURNOVER CYSTO (KITS) ×3 IMPLANT
LIGASURE VESSEL 5MM BLUNT TIP (ELECTROSURGICAL) ×3 IMPLANT
NDL HYPO 25X1 1.5 SAFETY (NEEDLE) IMPLANT
NDL INSUFFLATION 14GA 120MM (NEEDLE) IMPLANT
NEEDLE HYPO 25X1 1.5 SAFETY (NEEDLE) IMPLANT
NEEDLE INSUFFLATION 120MM (ENDOMECHANICALS) IMPLANT
NEEDLE INSUFFLATION 14GA 120MM (NEEDLE) ×3 IMPLANT
NS IRRIG 500ML POUR BTL (IV SOLUTION) ×3 IMPLANT
PACK LAPAROSCOPY BASIN (CUSTOM PROCEDURE TRAY) ×3 IMPLANT
PACK VAGINAL MINOR WOMEN LF (CUSTOM PROCEDURE TRAY) ×3 IMPLANT
PAD DRESSING TELFA 3X8 NADH (GAUZE/BANDAGES/DRESSINGS) ×2 IMPLANT
PAD OB MATERNITY 4.3X12.25 (PERSONAL CARE ITEMS) ×3 IMPLANT
PAD PREP 24X48 CUFFED NSTRL (MISCELLANEOUS) ×3 IMPLANT
SCISSORS LAP 5X35 DISP (ENDOMECHANICALS) IMPLANT
SEAL CERVICAL OMNI LOK (ABLATOR) IMPLANT
SEAL ROD LENS SCOPE MYOSURE (ABLATOR) ×3 IMPLANT
SET SUCTION IRRIG HYDROSURG (IRRIGATION / IRRIGATOR) IMPLANT
SET TUBE SMOKE EVAC HIGH FLOW (TUBING) ×3 IMPLANT
SOL PREP POV-IOD 4OZ 10% (MISCELLANEOUS) ×1 IMPLANT
SOLUTION ELECTROLUBE (MISCELLANEOUS) IMPLANT
STRIP CLOSURE SKIN 1/2X4 (GAUZE/BANDAGES/DRESSINGS) IMPLANT
SUT MNCRL AB 4-0 PS2 18 (SUTURE) ×5 IMPLANT
SUT PLAIN 2 0 (SUTURE) ×3
SUT PLAIN ABS 2-0 CT1 27XMFL (SUTURE) IMPLANT
SUT VICRYL 0 UR6 27IN ABS (SUTURE) ×5 IMPLANT
SYR 20ML LL LF (SYRINGE) ×1 IMPLANT
SYS BAG RETRIEVAL 10MM (BASKET) ×2
SYSTEM BAG RETRIEVAL 10MM (BASKET) IMPLANT
TOWEL OR 17X26 10 PK STRL BLUE (TOWEL DISPOSABLE) ×3 IMPLANT
TRAY FOLEY W/BAG SLVR 14FR LF (SET/KITS/TRAYS/PACK) ×3 IMPLANT
TROCAR 12M 150ML BLUNT (TROCAR) ×1 IMPLANT
TROCAR BALLN 12MMX100 BLUNT (TROCAR) IMPLANT
TROCAR BLADELESS OPT 5 100 (ENDOMECHANICALS) ×4 IMPLANT
TROCAR BLADELESS OPT 5 150 (ENDOMECHANICALS) ×2 IMPLANT
TROCAR XCEL NON-BLD 11X100MML (ENDOMECHANICALS) ×1 IMPLANT
WARMER LAPAROSCOPE (MISCELLANEOUS) ×3 IMPLANT

## 2021-10-28 NOTE — Anesthesia Postprocedure Evaluation (Signed)
Anesthesia Post Note  Patient: Megan Bradford  Procedure(s) Performed: DILATATION & CURETTAGE/HYSTEROSCOPY WITH MYOSURE (Vagina ) LAPAROSCOPIC SALPINGO OOPHORECTOMY WITH PELVIC WASHINGS (Bilateral: Abdomen) COMPLETE RESECTION OF VULVAR WARTS (Vagina )     Patient location during evaluation: PACU Anesthesia Type: General Level of consciousness: awake and alert Pain management: pain level controlled Vital Signs Assessment: post-procedure vital signs reviewed and stable Respiratory status: spontaneous breathing, nonlabored ventilation, respiratory function stable and patient connected to nasal cannula oxygen Cardiovascular status: blood pressure returned to baseline and stable Postop Assessment: no apparent nausea or vomiting Anesthetic complications: no   No notable events documented.  Last Vitals:  Vitals:   10/28/21 1548 10/28/21 1715  BP: (!) 157/95 (!) 132/96  Pulse: 81 78  Resp: 17 16  Temp: 36.6 C 36.5 C  SpO2:      Last Pain:  Vitals:   10/28/21 1715  TempSrc:   PainSc: 0-No pain                 Barnet Glasgow

## 2021-10-28 NOTE — Anesthesia Preprocedure Evaluation (Addendum)
Anesthesia Evaluation  Patient identified by MRN, date of birth, ID band Patient awake    Reviewed: Allergy & Precautions, NPO status , Patient's Chart, lab work & pertinent test results, reviewed documented beta blocker date and time   History of Anesthesia Complications Negative for: history of anesthetic complications  Airway Mallampati: III  TM Distance: >3 FB Neck ROM: Full    Dental no notable dental hx.    Pulmonary PE   Pulmonary exam normal        Cardiovascular hypertension, Pt. on medications and Pt. on home beta blockers + CAD and +CHF  Normal cardiovascular exam     Neuro/Psych negative neurological ROS  negative psych ROS   GI/Hepatic negative GI ROS, Neg liver ROS,   Endo/Other  Morbid obesity  Renal/GU Renal InsufficiencyRenal disease  negative genitourinary   Musculoskeletal negative musculoskeletal ROS (+)   Abdominal   Peds  Hematology negative hematology ROS (+)   Anesthesia Other Findings Day of surgery medications reviewed with patient.  Reproductive/Obstetrics negative OB ROS                            Anesthesia Physical Anesthesia Plan  ASA: 3  Anesthesia Plan: General   Post-op Pain Management: Tylenol PO (pre-op)*   Induction: Intravenous  PONV Risk Score and Plan: 4 or greater and Treatment may vary due to age or medical condition, Midazolam, Dexamethasone and Ondansetron  Airway Management Planned: Video Laryngoscope Planned and Oral ETT  Additional Equipment: None  Intra-op Plan:   Post-operative Plan: Extubation in OR  Informed Consent: I have reviewed the patients History and Physical, chart, labs and discussed the procedure including the risks, benefits and alternatives for the proposed anesthesia with the patient or authorized representative who has indicated his/her understanding and acceptance.     Dental advisory given  Plan  Discussed with: CRNA  Anesthesia Plan Comments:       Anesthesia Quick Evaluation

## 2021-10-28 NOTE — Transfer of Care (Signed)
Immediate Anesthesia Transfer of Care Note  Patient: Megan Bradford  Procedure(s) Performed: DILATATION & CURETTAGE/HYSTEROSCOPY WITH MYOSURE (Vagina ) LAPAROSCOPIC SALPINGO OOPHORECTOMY WITH PELVIC WASHINGS (Bilateral: Abdomen) COMPLETE RESECTION OF VULVAR WARTS (Vagina )  Patient Location: PACU  Anesthesia Type:General  Level of Consciousness: drowsy and patient cooperative  Airway & Oxygen Therapy: Patient Spontanous Breathing  Post-op Assessment: Report given to RN and Post -op Vital signs reviewed and stable  Post vital signs: Reviewed and stable  Last Vitals:  Vitals Value Taken Time  BP 157/95 10/28/21 1548  Temp 36.6 C 10/28/21 1548  Pulse 81 10/28/21 1548  Resp 17 10/28/21 1556  SpO2    Vitals shown include unvalidated device data.  Last Pain:  Vitals:   10/28/21 1548  TempSrc:   PainSc: Asleep      Patients Stated Pain Goal: 8 (15/40/08 6761)  Complications: No notable events documented.

## 2021-10-28 NOTE — Interval H&P Note (Signed)
History and Physical Interval Note:  10/28/2021 12:00 PM  Megan Bradford  has presented today for surgery, with the diagnosis of COMPLEX RIGHT OVARIAN CYST.  The various methods of treatment have been discussed with the patient and family. After consideration of risks, benefits and other options for treatment, the patient has consented to   [place] (N/A) LAPAROSCOPIC SALPINGO OOPHORECTOMY (Bilateral) COMPLETE RESECTION OF VULVAR WARTS (N/A)as a surgical intervention.  The patient's history has been reviewed, patient examined, no change in status, stable for surgery.  I have reviewed the patient's chart and labs.  Questions were answered to the patient's satisfaction.    Archie Endo, MD.

## 2021-10-28 NOTE — Anesthesia Procedure Notes (Signed)
Procedure Name: Intubation Date/Time: 10/28/2021 12:21 PM  Performed by: Clearnce Sorrel, CRNAPre-anesthesia Checklist: Patient identified, Emergency Drugs available, Suction available and Patient being monitored Patient Re-evaluated:Patient Re-evaluated prior to induction Oxygen Delivery Method: Circle System Utilized Preoxygenation: Pre-oxygenation with 100% oxygen Induction Type: IV induction Ventilation: Mask ventilation without difficulty Laryngoscope Size: Glidescope and 3 (preventive measure. pt complained severe sore throat with last intubation.) Grade View: Grade I Tube type: Oral Tube size: 7.0 mm Number of attempts: 1 Airway Equipment and Method: Stylet and Oral airway Placement Confirmation: ETT inserted through vocal cords under direct vision, positive ETCO2 and breath sounds checked- equal and bilateral Secured at: 21 cm Tube secured with: Tape Dental Injury: Teeth and Oropharynx as per pre-operative assessment

## 2021-10-28 NOTE — Discharge Instructions (Addendum)
Megan Bradford,  You may remove the dressing on the umbilicus in 5 days (Next Wednesday 6/21), but it may fall out by itself sooner than that and that's okay.  To remove the dressing next week, soak it with water during showering and then gently peel it away  starting from the corners.      Below the dressing you will see surgical glue, leave it as it is and allow it to fall off by itself, do not peel it off. The surgical lube will look like sticky brownish residue around the incision.   The other two lower incisions have surgical glue alone. You may shower as usual, keeping water exposure over the abdomen to a minimal and patting the incisions dry after showering.  Do not take baths at this time until cleared at the 2 weeks follow up visit.  You may see some bruising around the incision which may appear dark blue/black or yellow. This is normal and will resolve by itself over time.  Let me know if the bruising is expanding over time.  6.   Do not have any intercourse after the procedure until cleared.  7.  Expect some light vaginal bleeding, do not use tampons or douching for the next two weeks but you may use regular peri pads.  8. You may use dermaplast spray over the vulvar incision as needed.  The vulvar incision may also have itching from the sutures and the the spray should help with this.  9. You may have back pain after the procedure due to residual trapped air. This will resolve by itself as you ambulate more and use your pain medication.  10. Call me for any concerning symptoms such as fevers, chills, pain not well controlled despite pain medication use, nausea, vomiting.  11. You may do sitz baths (for the vulvar area) once a day to help with the healing, but do not take a regular bath until cleared at the office.   I wish you a quick recovery, Dr. Melonie [facility] Phone: [phone] [phone].  Post Anesthesia Home Care Instructions  Activity: Get plenty of rest for  the remainder of the day. A responsible adult should stay with you for 24 hours following the procedure.  For the next 24 hours, DO NOT: -Drive a car -Paediatric nurse -Drink alcoholic beverages -Take any medication unless instructed by your physician -Make any legal decisions or sign important papers.  Meals: Start with liquid foods such as gelatin or soup. Progress to regular foods as tolerated. Avoid greasy, spicy, heavy foods. If nausea and/or vomiting occur, drink only clear liquids until the nausea and/or vomiting subsides. Call your physician if vomiting continues.  Special Instructions/Symptoms: Your throat may feel dry or sore from the anesthesia or the breathing tube placed in your throat during surgery. If this causes discomfort, gargle with warm salt water. The discomfort should disappear within 24 hours.  If you had a scopolamine patch placed behind your ear for the management of post- operative nausea and/or vomiting:  1. The medication in the patch is effective for 72 hours, after which it should be removed.  Wrap patch in a tissue and discard in the trash. Wash hands thoroughly with soap and water. 2. You may remove the patch earlier than 72 hours if you experience unpleasant side effects which may include dry mouth, dizziness or visual disturbances. 3. Avoid touching the patch. Wash your hands with soap and water after contact with the patch.  DISCHARGE INSTRUCTIONS: Laparoscopy  The following instructions have been prepared to help you care for yourself upon your return home today.  Wound care:  Do not get the incision wet for the first 24 hours. The incision should be kept clean and dry.  The Band-Aids or dressings may be removed the day after surgery.  Should the incision become sore, red, and swollen after the first week, check with your doctor.  Personal hygiene:  Shower the day after your procedure.  Activity and limitations:  Do NOT drive or operate any  equipment today.  Do NOT lift anything more than 15 pounds for 2-3 weeks after surgery.  Do NOT rest in bed all day.  Walking is encouraged. Walk each day, starting slowly with 5-minute walks 3 or 4 times a day. Slowly increase the length of your walks.  Walk up and down stairs slowly.  Do NOT do strenuous activities, such as golfing, playing tennis, bowling, running, biking, weight lifting, gardening, mowing, or vacuuming for 2-4 weeks. Ask your doctor when it is okay to start.  Diet: Eat a light meal as desired this evening. You may resume your usual diet tomorrow.  Return to work: This is dependent on the type of work you do. For the most part you can return to a desk job within a week of surgery. If you are more active at work, please discuss this with your doctor.  What to expect after your surgery: You may have a slight burning sensation when you urinate on the first day. You may have a very small amount of blood in the urine. Expect to have a small amount of vaginal discharge/light bleeding for 1-2 weeks. It is not unusual to have abdominal soreness and bruising for up to 2 weeks. You may be tired and need more rest for about 1 week. You may experience shoulder pain for 24-72 hours. Lying flat in bed may relieve it.  Call your doctor for any of the following:  Develop a fever of 100.4 or greater  Inability to urinate 6 hours after discharge from hospital  Severe pain not relieved by pain medications  Persistent of heavy bleeding at incision site  Redness or swelling around incision site after a week  Increasing nausea or vomiting

## 2021-10-31 ENCOUNTER — Encounter (HOSPITAL_BASED_OUTPATIENT_CLINIC_OR_DEPARTMENT_OTHER): Payer: Self-pay | Admitting: Obstetrics & Gynecology

## 2021-10-31 LAB — SURGICAL PATHOLOGY

## 2021-11-01 LAB — CYTOLOGY - NON PAP

## 2021-11-04 ENCOUNTER — Encounter (HOSPITAL_COMMUNITY): Payer: Self-pay

## 2021-11-20 ENCOUNTER — Other Ambulatory Visit: Payer: Self-pay | Admitting: Emergency Medicine

## 2021-11-22 NOTE — Telephone Encounter (Signed)
This medication should be prescribed by renal transplant doctors. Thanks.

## 2021-11-28 ENCOUNTER — Other Ambulatory Visit: Payer: Self-pay | Admitting: Obstetrics & Gynecology

## 2021-11-28 DIAGNOSIS — D3911 Neoplasm of uncertain behavior of right ovary: Secondary | ICD-10-CM

## 2021-12-22 ENCOUNTER — Other Ambulatory Visit: Payer: Medicare Other

## 2022-01-13 ENCOUNTER — Ambulatory Visit
Admission: RE | Admit: 2022-01-13 | Discharge: 2022-01-13 | Disposition: A | Discharge status: Home / Self Care | Payer: Medicare Other | Source: Ambulatory Visit | Attending: Obstetrics & Gynecology | Admitting: Obstetrics & Gynecology

## 2022-01-13 DIAGNOSIS — D3911 Neoplasm of uncertain behavior of right ovary: Secondary | ICD-10-CM

## 2022-01-18 ENCOUNTER — Ambulatory Visit (INDEPENDENT_AMBULATORY_CARE_PROVIDER_SITE_OTHER): Payer: Medicare Other | Admitting: Orthopaedic Surgery

## 2022-01-18 ENCOUNTER — Ambulatory Visit (INDEPENDENT_AMBULATORY_CARE_PROVIDER_SITE_OTHER): Payer: Medicare Other

## 2022-01-18 DIAGNOSIS — M79672 Pain in left foot: Secondary | ICD-10-CM

## 2022-01-18 NOTE — Progress Notes (Signed)
Office Visit Note   Patient: Megan Bradford           Date of Birth: 1950-12-02           MRN: [id number] Visit Date: 01/18/2022              Requested by: Horald Pollen, MD [place],  [place] [address] PCP: Horald Pollen, MD   Assessment & Plan: Visit Diagnoses:  1. Left foot pain     Plan: Impression is aggravation of pre-existing left hallux rigidus.  Treatments were discussed to include topical NSAIDs, relative rest, immobilization with stiff sole shoe.  If symptoms persist could consider injections.  Follow-Up Instructions: No follow-ups on file.   Orders:  Orders Placed This Encounter  Procedures   XR Foot 2 Views Left   No orders of the defined types were placed in this encounter.     Procedures: No procedures performed   Clinical Data: No additional findings.   Subjective: Chief Complaint  Patient presents with   Left Foot - Pain    HPI Megan Bradford is a 71 year old patient comes in for evaluation of left great toe pain.  Injured it a few weeks ago when her son-in-law lowered the front seat of her car down onto her big toe.  She has had to wear open toe shoes.  Has some constant pain.  Took half a tablet of oxycodone.  She is still able to weight-bear and walk. Review of Systems  Constitutional: Negative.   HENT: Negative.    Eyes: Negative.   Respiratory: Negative.    Cardiovascular: Negative.   Endocrine: Negative.   Musculoskeletal: Negative.   Neurological: Negative.   Hematological: Negative.   Psychiatric/Behavioral: Negative.    All other systems reviewed and are negative.    Objective: Vital Signs: There were no vitals taken for this visit.  Physical Exam Vitals and nursing note reviewed.  Constitutional:      Appearance: She is well-developed.  HENT:     Head: Atraumatic.     Nose: Nose normal.  Eyes:     Extraocular Movements: Extraocular movements intact.  Cardiovascular:     Pulses: Normal pulses.   Pulmonary:     Effort: Pulmonary effort is normal.  Abdominal:     Palpations: Abdomen is soft.  Musculoskeletal:     Cervical back: Neck supple.  Skin:    General: Skin is warm.     Capillary Refill: Capillary refill takes less than 2 seconds.  Neurological:     Mental Status: She is alert. Mental status is at baseline.  Psychiatric:        Behavior: Behavior normal.        Thought Content: Thought content normal.        Judgment: Judgment normal.     Ortho Exam Examination of the left great toe shows tenderness to the MTP joint.  There is pain and crepitus with range of motion.  There is a palpable dorsal medial osteophyte.  Capillary refill is normal.  No open wounds.  Motor and sensory functions are intact. Specialty Comments:  No specialty comments available.  Imaging: XR Foot 2 Views Left  Result Date: 01/18/2022 Advanced degenerative changes of the great toe MTP joint consistent with advanced hallux rigidus.  There is subchondral sclerosis and cystic changes and large osteophyte formation.  Joint space narrowing.    PMFS History: Patient Active Problem List   Diagnosis Date Noted   Hospital discharge follow-up 10/20/2021  Dyslipidemia 10/20/2021   Chronic diastolic CHF (congestive heart failure) ([place]) 10/14/2021   Mixed hyperlipidemia 10/14/2021   Diarrhea 10/14/2021   Chest pain 10/13/2021   Dyssynergic defecation    Incontinence of feces with fecal urgency    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/01/2020   Trigger finger, right middle finger 10/28/2020   Coronary artery disease involving native coronary artery of native heart 08/30/2020   Immunosuppression ([place]) 08/30/2020   Chronic kidney disease, stage 3b ([place]) 12/17/2019   History of kidney transplant 11/27/2019   Essential hypertension 11/27/2019   Prediabetes 11/27/2019   Body mass index (BMI) of 38.0-38.9 in adult 11/27/2019   Past Medical History:  Diagnosis Date   Cataract    Phreesia  11/24/2019- removed both   Chronic kidney disease    Phreesia 11/24/2019   Clotting disorder ([place])    PE 2010 per pt there really was not a PE   Fistula    LUE   Heart murmur    Hypertension    Phreesia 11/24/2019   Kidney transplanted    Pre-diabetes    no meds   Pulmonary embolism ([place]) 2010    Family History  Problem Relation Age of Onset   Diabetes Mother    Asthma Mother    Hypertension Father    Coronary artery disease Father    Colon cancer Neg Hx    Colon polyps Neg Hx    Esophageal cancer Neg Hx    Rectal cancer Neg Hx    Stomach cancer Neg Hx     Past Surgical History:  Procedure Laterality Date   ANAL RECTAL MANOMETRY N/A 08/03/2021   Procedure: ANO RECTAL MANOMETRY;  Surgeon: Thornton Park, MD;  Location: [facility];  Service: Gastroenterology;  Laterality: N/A;   CARDIAC CATHETERIZATION     x2   CESAREAN SECTION N/A    Phreesia 11/24/2019   CHOLECYSTECTOMY N/A    [employer]reesia 11/24/2019   COLONOSCOPY     DILATATION & CURETTAGE/HYSTEROSCOPY WITH MYOSURE N/A 10/28/2021   Procedure: DILATATION & CURETTAGE/HYSTEROSCOPY;  Surgeon: Waymon Amato, MD;  Location: [facility];  Service: Gynecology;  Laterality: N/A;   DILATION AND CURETTAGE OF UTERUS     EYE SURGERY N/A    Cataracts removed [employer]reesia 11/24/2019   HIP SURGERY Left 1990   Hip dysplasia cleaned out per patient   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   LAPAROSCOPIC SALPINGO OOPHERECTOMY Bilateral 10/28/2021   Procedure: LAPAROSCOPIC SALPINGO OOPHORECTOMY WITH PELVIC WASHINGS;  Surgeon: Waymon Amato, MD;  Location: [facility];  Service: Gynecology;  Laterality: Bilateral;   POLYPECTOMY     HPP   TONSILLECTOMY     TRANSFORAMINAL LUMBAR INTERBODY FUSION (TLIF) WITH PEDICLE SCREW FIXATION 1 LEVEL N/A 12/01/2020   Procedure: Transiforaminal Lumbar Interbody Fusion Lumbar four--Lumbar five;  Surgeon: Vallarie Mare, MD;  Location: [facility];  Service: Neurosurgery;  Laterality: N/A;    TRIGGER FINGER RELEASE Right 10/28/2020   Long finger   VULVAR LESION REMOVAL N/A 10/28/2021   Procedure: COMPLETE RESECTION OF VULVAR WARTS;  Surgeon: Waymon Amato, MD;  Location: [facility];  Service: Gynecology;  Laterality: N/A;   Social History   Occupational History   Not on file  Tobacco Use   Smoking status: Never   Smokeless tobacco: Never  Vaping Use   Vaping Use: Never used  Substance and Sexual Activity   Alcohol use: Never   Drug use: Never   Sexual activity: Not Currently  Birth control/protection: None

## 2022-01-30 ENCOUNTER — Telehealth: Payer: Self-pay | Admitting: *Deleted

## 2022-01-30 NOTE — Telephone Encounter (Signed)
Spoke with the patient regarding the referral to GYN oncology. Patient scheduled as new patient with Dr Berline Lopes on 9/21 at 9:45 am. Patient given an arrival time of 9:15 am.  Explained to the patient the the doctor will perform a pelvic exam at this visit. Patient given the policy that no visitors under the 16 yrs are allowed in the [place]. Patient given the address/phone number for the clinic and that the center offers free valet service.

## 2022-01-31 ENCOUNTER — Encounter: Payer: Self-pay | Admitting: Gynecologic Oncology

## 2022-02-02 ENCOUNTER — Encounter: Payer: Self-pay | Admitting: Gynecologic Oncology

## 2022-02-02 ENCOUNTER — Inpatient Hospital Stay: Payer: Medicare Other | Attending: Gynecologic Oncology | Admitting: Gynecologic Oncology

## 2022-02-02 VITALS — BP 103/62 | HR 75 | Temp 98.5°F | Resp 16 | Ht 60.24 in | Wt 200.0 lb

## 2022-02-02 DIAGNOSIS — K5790 Diverticulosis of intestine, part unspecified, without perforation or abscess without bleeding: Secondary | ICD-10-CM | POA: Insufficient documentation

## 2022-02-02 DIAGNOSIS — E278 Other specified disorders of adrenal gland: Secondary | ICD-10-CM | POA: Diagnosis not present

## 2022-02-02 DIAGNOSIS — N840 Polyp of corpus uteri: Secondary | ICD-10-CM

## 2022-02-02 DIAGNOSIS — R978 Other abnormal tumor markers: Secondary | ICD-10-CM

## 2022-02-02 DIAGNOSIS — E669 Obesity, unspecified: Secondary | ICD-10-CM

## 2022-02-02 DIAGNOSIS — D3910 Neoplasm of uncertain behavior of unspecified ovary: Secondary | ICD-10-CM

## 2022-02-02 DIAGNOSIS — Z940 Kidney transplant status: Secondary | ICD-10-CM

## 2022-02-02 DIAGNOSIS — A630 Anogenital (venereal) warts: Secondary | ICD-10-CM

## 2022-02-02 NOTE — Patient Instructions (Addendum)
It was very nice to meet you today.  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  Based on findings from surgery as well as your CT imaging, you had stage I disease, meaning that the cancer had not spread outside of the ovary.  Because of this, I am not recommending any additional treatment.  We will plan on closer follow-up, because there is a risk of recurrence.  We can alternate this between my office and your OB/GYN.  I would like you to see 1 of [place] every 6 months.  We will plan to do an exam and get the blood test we talked about today at each visit (inhibin B).  Plan to see Dr. Alesia Richards in November 2023 and Dr. Berline Lopes in May 2024. Please call our office after you see Dr. Alesia Richards at [phone] to arrange for an appt for May with Dr. Berline Lopes.  Please make sure, if you develop any of the symptoms that we discussed today between visits, to call my office.  These would include vaginal bleeding, pelvic pain, change to your bowel function, unintentional weight loss, or any other concerning symptoms.

## 2022-02-02 NOTE — Progress Notes (Signed)
GYNECOLOGIC ONCOLOGY NEW PATIENT CONSULTATION   Patient Name: Megan Bradford  Patient Age: 71 y.o. Date of Service: 02/02/22 Referring Provider: Dr. 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  Patient is overall healed well from surgery.  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  I reviewed that these are a rare type of ovarian cancer that tend to behave in an indolent manner.  There was some question about her diagnosis based on pathology review here, but granulosa cell tumor has been confirmed by second opinion at [facility].  We discussed that these tumors typically produce estrogen and that may have contributed to her postmenopausal bleeding/polyp development.  Post-operative CT scan was negative for evidence of metastatic disease. I reviewed that inhibin B is a tumor marker that can be elevated in this type of ovarian cancer.  Unfortunately, we do not know if this is a tumor marker for her as it was not obtained preoperatively.  We will use this moving forward as part of her surveillance plan.  Per NCCN surveillance recommendations, in the setting of low risk disease, we will plan for follow-up visits initially every 6 months.  We will plan to alternate these visits between my office and her OB/GYN.  We will perform an exam and get a inhibin B at each visit.  She sees her OB/GYN in November.  I will see her back in May.  I have asked her to call the clinic to schedule this visit after the new year.  Discussed signs and symptoms that would be concerning for recurrence of her granulosa cell tumor.  I have stressed the importance of calling if she develops any of these.  Reviewed findings of an adrenal mass on her recent CT scan.  In looking back in care everywhere, there has been an adrenal mass measuring  approximately 2 cm noted on imaging in 2016 without significant change.  I think this can continue to be followed up on imaging.  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  This is likely related to her immunosuppression given medications for her history of kidney transplant.  A copy of this note was sent to the patient's referring provider.   65 minutes of total time was spent for this patient encounter, including preparation, face-to-face counseling with the patient and coordination of care, and documentation of the encounter.  Jeral Pinch, MD  Division of Gynecologic Oncology  Department of Obstetrics and Gynecology  [facility]  ___________________________________________  Chief Complaint: Chief Complaint  Patient presents with   Granulosa cell tumor    History of Present Illness:  Megan Bradford is a 71 y.o. y.o. female who is seen in consultation at the request of Dr. Alesia Richards for an evaluation of incidental diagnosis of an adult granulosa cell tumor.  The patient has been followed for a history of a complex right ovarian cyst, pelvic pain, and an endometrial lesion suspicious for a polyp.  Ultrasound in December 2022 showed a hyperechoic structure at the fundus measuring up to 1.1 cm, consistent with a likely polyp.  Right ovary measured 2.9 x 2.3 x 2.4 cm with a cystic structure with low-level level  internal echoes.  On follow-up ultrasound in February 2023, 1.4 cm cystic structure within the endometrial cavity noted with a feeder vessel.  Right ovary measured 2.3 x 2.4 x 2.2 centimeters with a cystic lesion again noted with low-level internal echoes. CA-125 in 05/2021 was 11.6.  On 10/28/2021, the patient underwent laparoscopic BSO, dilation and curettage with hysteroscopy, excision of vulvar  warts.  Findings at the time of surgery were a normal-appearing left ovary.  Right ovary with a 3 cm cyst.  Normal bilateral fallopian tubes.  The right adnexa was removed without surgical spill.  There was perforation of the uterus upon placement of the [place] (recognized after intra-abdominal entry).  Hysteroscopy and D&C was performed subsequently.  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  Pelvic washings were negative.  The specimen was sent out to [facility] where Dr. Annamaria Boots favored that the right ovarian tumor represented an 8 adult granulosa cell tumor.  He commented that there was a diffuse growth of somewhat more rounded epithelioid appearing cells consistent with a diffuse pattern of adult granulosa cell tumor as well as the presence of scattered lutein cells.  Inhibin B on 11/29/21 was <7.   CT scan of the abdomen and pelvis on 01/13/2022 showed no acute intra-abdominal or pelvic pathology.  Right lower quadrant renal transplant noted without hydronephrosis or nephrolithiasis.  Sigmoid diverticulosis.  Indeterminate 2.5 cm left adrenal nodule that could be better assessed by MRI.  Patient reports doing well after surgery.  She denies any pelvic or abdominal pain.  She denies any bleeding since her surgery.  She endorses regular bowel function.  She has some increased urinary frequency but has been working on hydrating more.  She notes similar energy levels.  She has a history of a kidney transplant in 2016.  Had been followed in [place] at [facility] but moved to [place] about 3 years ago.  Sees a nephrologist at [facility] and [place] team [place].    PAST MEDICAL HISTORY:  Past Medical History:  Diagnosis Date   Cataract    Phreesia 11/24/2019- removed both   Chronic kidney disease    Phreesia 11/24/2019   Clotting disorder ([place])    PE 2010 per pt there really  was not a PE   Fistula    LUE; was on dialysis for 11 months   Heart murmur    Hypertension    Phreesia 11/24/2019   Kidney transplanted    transplant team [facility] [place])   Pre-diabetes    no meds   Pulmonary embolism ([place]) 2010   never symptomatic     PAST SURGICAL HISTORY:  Past Surgical History:  Procedure Laterality Date   ANAL RECTAL MANOMETRY N/A 08/03/2021   Procedure: ANO RECTAL MANOMETRY;  Surgeon: Thornton Park, MD;  Location: [facility];  Service: Gastroenterology;  Laterality: N/A;   CARDIAC CATHETERIZATION     x2   CESAREAN SECTION N/A    Phreesia 11/24/2019   CHOLECYSTECTOMY N/A    Phreesia 11/24/2019   COLONOSCOPY     DILATATION & CURETTAGE/HYSTEROSCOPY WITH MYOSURE N/A 10/28/2021   Procedure: DILATATION & CURETTAGE/HYSTEROSCOPY;  Surgeon: Waymon Amato, MD;  Location: [place];  Service: Gynecology;  Laterality: N/A;   DILATION AND CURETTAGE OF UTERUS     EYE SURGERY N/A    Cataracts removed Phreesia 11/24/2019   HIP SURGERY Left 1990   Hip  dysplasia cleaned out per patient   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   LAPAROSCOPIC SALPINGO OOPHERECTOMY Bilateral 10/28/2021   Procedure: LAPAROSCOPIC SALPINGO OOPHORECTOMY WITH PELVIC WASHINGS;  Surgeon: Waymon Amato, MD;  Location: [place];  Service: Gynecology;  Laterality: Bilateral;   POLYPECTOMY     HPP   TONSILLECTOMY     TRANSFORAMINAL LUMBAR INTERBODY FUSION (TLIF) WITH PEDICLE SCREW FIXATION 1 LEVEL N/A 12/01/2020   Procedure: Transiforaminal Lumbar Interbody Fusion Lumbar four--Lumbar five;  Surgeon: Vallarie Mare, MD;  Location: [place];  Service: Neurosurgery;  Laterality: N/A;   TRIGGER FINGER RELEASE Right 10/28/2020   Long finger   VULVAR LESION REMOVAL N/A 10/28/2021   Procedure: COMPLETE RESECTION OF VULVAR WARTS;  Surgeon: Waymon Amato, MD;  Location: [place];  Service: Gynecology;  Laterality: N/A;    OB/GYN HISTORY:  OB History   Gravida Para Term Preterm AB Living  '3 2     1 2  '$ SAB IAB Ectopic Multiple Live Births               # Outcome Date GA Lbr Len/2nd Weight Sex Delivery Anes PTL Lv  3 AB           2 Para           1 Para             No LMP recorded. Patient is postmenopausal.  Age at menarche: 64 Age at menopause: 81 Hx of HRT: Denies Hx of STDs: Denies Last pap: 02/2021- normal per patient History of abnormal pap smears: denies  SCREENING STUDIES:  Last mammogram: 2022  Last colonoscopy: 2022  MEDICATIONS: Outpatient Encounter Medications as of 02/02/2022  Medication Sig   acetaminophen (TYLENOL) 650 MG CR tablet Take 650 mg by mouth every 8 (eight) hours as needed for pain.   amLODipine (NORVASC) 5 MG tablet Take 5 mg by mouth daily.   Apoaequorin (PREVAGEN) 10 MG CAPS Take 10 mg by mouth daily.   aspirin EC 81 MG tablet Take 81 mg by mouth daily. Swallow whole.   benzocaine-Menthol (DERMOPLAST) 20-0.5 % AERO Apply 1 Application topically 4 (four) times daily as needed for irritation.   carvedilol (COREG) 12.5 MG tablet Take 12.5 mg by mouth 2 (two) times daily with a meal.   cetirizine (ZYRTEC) 10 MG tablet Take 10 mg by mouth daily.   Cholecalciferol (VITAMIN D3) 10 MCG (400 UNIT) CAPS Take 400 Units by mouth daily.   Coenzyme Q10 (CO Q10 PO) Take 1 capsule by mouth daily.   diclofenac Sodium (VOLTAREN) 1 % GEL Apply 2 g topically 4 (four) times daily. (Patient taking differently: Apply 2 g topically daily as needed (pain).)   lisinopril (ZESTRIL) 5 MG tablet Take 5 mg by mouth daily.   magnesium oxide (MAG-OX) 400 MG tablet Take 400 mg by mouth daily.   multivitamin-lutein (OCUVITE-LUTEIN) CAPS capsule Take 1 capsule by mouth daily.   mycophenolate (MYFORTIC) 180 MG EC tablet Take 180 mg by mouth 2 (two) times daily.   rosuvastatin (CRESTOR) 20 MG tablet Take 1 tablet (20 mg total) by mouth daily.   tacrolimus (PROGRAF) 1 MG capsule TAKE 4 CAPSULES BY MOUTH IN THE MORNING. TAKE 2  CAPSULES BY MOUTH IN THE EVENING   LORazepam (ATIVAN) 2 MG tablet Take 2 mg by mouth 2 (two) times daily as needed.   [DISCONTINUED] oxyCODONE (ROXICODONE) 5 MG immediate release tablet Take 1 tablet (5 mg total) by mouth every  4 (four) hours as needed for severe pain or breakthrough pain.   No facility-administered encounter medications on file as of 02/02/2022.    ALLERGIES:  Allergies  Allergen Reactions   Keflex [Cephalexin] Anaphylaxis     FAMILY HISTORY:  Family History  Problem Relation Age of Onset   Diabetes Mother    Asthma Mother    Hypertension Father    Coronary artery disease Father    Colon cancer Neg Hx    Colon polyps Neg Hx    Esophageal cancer Neg Hx    Rectal cancer Neg Hx    Stomach cancer Neg Hx    Breast cancer Neg Hx    Ovarian cancer Neg Hx    Endometrial cancer Neg Hx    Pancreatic cancer Neg Hx    Prostate cancer Neg Hx      SOCIAL HISTORY:  Social Connections: Moderately Integrated (08/25/2021)   Social Connection and Isolation Panel [NHANES]    Frequency of Communication with Friends and Family: More than three times a week    Frequency of Social Gatherings with Friends and Family: More than three times a week    Attends Religious Services: More than 4 times per year    Active Member of [employer] or Organizations: Yes    Attends Archivist Meetings: More than 4 times per year    Marital Status: Widowed    REVIEW OF SYSTEMS:  + joint pain, easy bruising Denies appetite changes, fevers, chills, fatigue, unexplained weight changes. Denies hearing loss, neck lumps or masses, mouth sores, ringing in ears or voice changes. Denies cough or wheezing.  Denies shortness of breath. Denies chest pain or palpitations. Denies leg swelling. Denies abdominal distention, pain, blood in stools, constipation, diarrhea, nausea, vomiting, or early satiety. Denies pain with intercourse, dysuria, frequency, hematuria or incontinence. Denies hot flashes,  pelvic pain, vaginal bleeding or vaginal discharge.   Denies back pain or muscle pain/cramps. Denies itching, rash, or wounds. Denies dizziness, headaches, numbness or seizures. Denies swollen lymph nodes or glands. Denies anxiety, depression, confusion, or decreased concentration.  Physical Exam:  Vital Signs for this encounter:  Blood pressure 103/62, pulse 75, temperature 98.5 F (36.9 C), temperature source Oral, resp. rate 16, height 5' 0.24" (1.53 m), weight 200 lb (90.7 kg), SpO2 100 %. Body mass index is 38.75 kg/m. General: Alert, oriented, no acute distress.  HEENT: Normocephalic, atraumatic. Sclera anicteric.  Chest: Clear to auscultation bilaterally. No wheezes, rhonchi, or rales. Cardiovascular: Regular rate and rhythm, no murmurs, rubs, or gallops.  Abdomen: Obese. Normoactive bowel sounds. Soft, nondistended, nontender to palpation. No masses or hepatosplenomegaly appreciated. No palpable fluid wave.  Well-healed laparoscopic incisions. Extremities: Grossly normal range of motion. Warm, well perfused. No edema bilaterally.  Skin: No rashes or lesions.  Lymphatics: No cervical, supraclavicular, or inguinal adenopathy.  GU:  Normal external female genitalia.  No lesions. No discharge or bleeding.             Bladder/urethra:  No lesions or masses, well supported bladder             Vagina: Mildly atrophic, no lesions noted.             Cervix: Normal appearing, no lesions.             Uterus: Small, mobile, no parametrial involvement or nodularity.             Adnexa: No masses appreciated.  Rectal: Deferred.  LABORATORY AND RADIOLOGIC DATA:  Outside medical records were reviewed to synthesize the above history, along with the history and physical obtained during the visit.   Lab Results  Component Value Date   WBC 5.6 10/25/2021   HGB 12.9 10/25/2021   HCT 40.7 10/25/2021   PLT 218 10/25/2021   GLUCOSE 165 (H) 10/25/2021   CHOL 143 10/14/2021   TRIG 64 10/14/2021    HDL 56 10/14/2021   LDLCALC 74 10/14/2021   ALT 65 (H) 10/25/2021   AST 31 10/25/2021   NA 141 10/25/2021   K 3.9 10/25/2021   CL 108 10/25/2021   CREATININE 1.72 (H) 10/25/2021   BUN 26 (H) 10/25/2021   CO2 25 10/25/2021   HGBA1C 6.5 (H) 10/14/2021

## 2022-02-08 ENCOUNTER — Telehealth: Payer: Self-pay | Admitting: Licensed Clinical Social Worker

## 2022-02-08 ENCOUNTER — Inpatient Hospital Stay: Payer: Medicare Other | Admitting: Licensed Clinical Social Worker

## 2022-02-08 NOTE — Telephone Encounter (Signed)
[place] Work  Clinical Social Work was referred by new patient protocol for assessment of psychosocial needs.  Clinical Social Worker attempted to contact patient by phone  to offer support and assess for needs.   No answer. No VM available on home phone. Left VM with direct contact information on cell phone.     [place], [place] Worker [employer]

## 2022-02-08 NOTE — Progress Notes (Signed)
[place] Work  Clinical Social Work was referred by new patient protocol for assessment of psychosocial needs.  Clinical Social Worker contacted patient by phone  to offer support and assess for needs.    Patient reports no SDOH needs at this time. She is living with daughter and moved here a few years ago. She is struggling with some fears of cancer returning as she is under surveillance every 6 months since surgery. She is reconciling this with her faith and belief in healing. CSW offered counseling services through [facility] with CSW, intern, or spiritual care. Pt declined at this time as she is working through it individually but will call as needed. CSW also reviewed other support programs and mailed October calendar to pt per her request.     Christeen Douglas, [place] Worker [facility]

## 2022-03-17 ENCOUNTER — Emergency Department (HOSPITAL_COMMUNITY)
Admission: EM | Admit: 2022-03-17 | Discharge: 2022-03-18 | Disposition: A | Discharge status: Home / Self Care | Payer: Medicare Other | Attending: Emergency Medicine | Admitting: Emergency Medicine

## 2022-03-17 DIAGNOSIS — U071 COVID-19: Secondary | ICD-10-CM | POA: Insufficient documentation

## 2022-03-17 DIAGNOSIS — Z7982 Long term (current) use of aspirin: Secondary | ICD-10-CM | POA: Insufficient documentation

## 2022-03-17 DIAGNOSIS — R509 Fever, unspecified: Secondary | ICD-10-CM | POA: Diagnosis present

## 2022-03-18 ENCOUNTER — Encounter (HOSPITAL_COMMUNITY): Payer: Self-pay | Admitting: Emergency Medicine

## 2022-03-18 ENCOUNTER — Emergency Department (HOSPITAL_COMMUNITY): Payer: Medicare Other

## 2022-03-18 ENCOUNTER — Other Ambulatory Visit: Payer: Self-pay

## 2022-03-18 DIAGNOSIS — U071 COVID-19: Secondary | ICD-10-CM | POA: Diagnosis not present

## 2022-03-18 LAB — RESP PANEL BY RT-PCR (FLU A&B, COVID) ARPGX2
Influenza A by PCR: NEGATIVE
Influenza B by PCR: NEGATIVE
SARS Coronavirus 2 by RT PCR: POSITIVE — AB

## 2022-03-18 MED ORDER — BENZONATATE 100 MG PO CAPS
200.0000 mg | ORAL_CAPSULE | ORAL | Status: AC
  Administered 2022-03-18: 200 mg via ORAL
  Filled 2022-03-18: qty 2

## 2022-03-18 NOTE — ED Provider Notes (Signed)
[facility] DEPT Provider Note   CSN: [id number] Arrival date & time: 03/17/22  2338     History  Chief Complaint  Patient presents with   Fever   Cough    Megan Bradford is a 71 y.o. female.  The history is provided by the patient.  Fever Temp source:  Oral Severity:  Moderate Onset quality:  Gradual Duration:  2 days Timing:  Intermittent Progression:  Unchanged Chronicity:  New Relieved by:  Nothing Worsened by:  Nothing Ineffective treatments:  None tried Associated symptoms: cough and sore throat   Risk factors: no contaminated food   Cough Associated symptoms: fever and sore throat   Associated symptoms: no shortness of breath        Home Medications Prior to Admission medications   Medication Sig Start Date End Date Taking? Authorizing Provider  acetaminophen (TYLENOL) 650 MG CR tablet Take 650 mg by mouth every 8 (eight) hours as needed for pain.    [provider]  amLODipine (NORVASC) 5 MG tablet Take 5 mg by mouth daily.    [provider]  Apoaequorin (PREVAGEN) 10 MG CAPS Take 10 mg by mouth daily.    [provider]  aspirin EC 81 MG tablet Take 81 mg by mouth daily. Swallow whole.    [provider]  benzocaine-Menthol (DERMOPLAST) 20-0.5 % AERO Apply 1 Application topically 4 (four) times daily as needed for irritation. 10/28/21   Waymon Amato, MD  carvedilol (COREG) 12.5 MG tablet Take 12.5 mg by mouth 2 (two) times daily with a meal.    [provider]  cetirizine (ZYRTEC) 10 MG tablet Take 10 mg by mouth daily.    [provider]  Cholecalciferol (VITAMIN D3) 10 MCG (400 UNIT) CAPS Take 400 Units by mouth daily.    [provider]  Coenzyme Q10 (CO Q10 PO) Take 1 capsule by mouth daily.    [provider]  diclofenac Sodium (VOLTAREN) 1 % GEL Apply 2 g topically 4 (four) times daily. Patient taking differently: Apply 2 g topically daily as needed  (pain). 12/01/19   Wieters, Hallie C, [name]  lisinopril (ZESTRIL) 5 MG tablet Take 5 mg by mouth daily. 03/26/21   [provider]  LORazepam (ATIVAN) 2 MG tablet Take 2 mg by mouth 2 (two) times daily as needed. 01/10/22   [provider]  magnesium oxide (MAG-OX) 400 MG tablet Take 400 mg by mouth daily.    [provider]  multivitamin-lutein (OCUVITE-LUTEIN) CAPS capsule Take 1 capsule by mouth daily.    [provider]  mycophenolate (MYFORTIC) 180 MG EC tablet Take 180 mg by mouth 2 (two) times daily.    [provider]  rosuvastatin (CRESTOR) 20 MG tablet Take 1 tablet (20 mg total) by mouth daily. 12/20/20   [name], MD  tacrolimus (PROGRAF) 1 MG capsule TAKE 4 CAPSULES BY MOUTH IN THE MORNING. TAKE 2 CAPSULES BY MOUTH IN THE EVENING 11/23/21   Horald Pollen, MD      Allergies    Keflex [cephalexin]    Review of Systems   Review of Systems  Constitutional:  Positive for fever.  HENT:  Positive for sore throat.   Eyes:  Negative for redness.  Respiratory:  Positive for cough. Negative for shortness of breath.   All other systems reviewed and are negative.   Physical Exam Updated Vital Signs BP (!) 142/89   Pulse 67   Temp 98.7 F (37.1  C) (Oral)   Resp 16   Ht 5' (1.524 m)   Wt 85.7 kg   SpO2 97%   BMI 36.91 kg/m  Physical Exam Vitals and nursing note reviewed.  Constitutional:      General: She is not in acute distress.    Appearance: Normal appearance. She is well-developed.  HENT:     Head: Normocephalic and atraumatic.     Nose: Nose normal.     Mouth/Throat:     Mouth: Mucous membranes are moist.     Pharynx: Oropharynx is clear. No oropharyngeal exudate.  Eyes:     Pupils: Pupils are equal, round, and reactive to light.  Cardiovascular:     Rate and Rhythm: Normal rate and regular rhythm.     Pulses: Normal pulses.     Heart sounds: Normal heart sounds.  Pulmonary:     Effort: Pulmonary effort  is normal. No respiratory distress.     Breath sounds: Normal breath sounds.  Abdominal:     General: Abdomen is flat. Bowel sounds are normal. There is no distension.     Palpations: Abdomen is soft.     Tenderness: There is no abdominal tenderness. There is no guarding or rebound.  Genitourinary:    Vagina: No vaginal discharge.  Musculoskeletal:        General: Normal range of motion.     Cervical back: Normal range of motion and neck supple.  Skin:    General: Skin is warm and dry.     Capillary Refill: Capillary refill takes less than 2 seconds.     Findings: No erythema or rash.  Neurological:     General: No focal deficit present.     Mental Status: She is alert and oriented to person, place, and time.     Deep Tendon Reflexes: Reflexes normal.  Psychiatric:        Mood and Affect: Mood normal.        Behavior: Behavior normal.     ED Results / Procedures / Treatments   Labs (all labs ordered are listed, but only abnormal results are displayed) Labs Reviewed  RESP PANEL BY RT-PCR (FLU A&B, COVID) [id number] - Abnormal; Notable for the following components:      Result Value   SARS Coronavirus 2 by RT PCR POSITIVE (*)    All other components within normal limits    EKG EKG Interpretation  Date/Time:  Saturday March 18 2022 00:21:28 EDT Ventricular Rate:  74 PR Interval:  142 QRS Duration: 87 QT Interval:  377 QTC Calculation: 419 R Axis:   -71 Text Interpretation: Sinus rhythm Confirmed by Randal Buba, Anetha Slagel ([id number]) on 03/18/2022 1:32:09 AM  Radiology DG Chest 2 View  Result Date: 03/18/2022 CLINICAL DATA:  Fever, cough, congestion EXAM: CHEST - 2 VIEW COMPARISON:  10/13/2021 FINDINGS: Lungs are clear.  No pleural effusion or pneumothorax. The heart is normal in size. Degenerative changes of the visualized thoracolumbar spine. IMPRESSION: Normal chest radiographs. Electronically Signed   By: Julian Hy M.D.   On: 03/18/2022 00:47    Procedures Procedures     Medications Ordered in ED Medications  benzonatate (TESSALON) capsule 200 mg (200 mg Oral Given 03/18/22 0231)    ED Course/ Medical Decision Making/ A&P                           Medical Decision Making Patient with fever and URi x 2 days   Amount and/or Complexity  of Data Reviewed External Data Reviewed: notes.    Details: Previous notes reviewed  Labs: ordered.    Details: Covid is positive  Radiology: ordered and independent interpretation performed.    Details: Negative chest Xray   Risk Prescription drug management. Risk Details: Well appearing,  exam and vitals are benign and reassuring.  Stable for discharge.  Strict return     Final Clinical Impression(s) / ED Diagnoses Final diagnoses:  [id number]   Return for intractable cough, coughing up blood, fevers > 100.4 unrelieved by medication, shortness of breath, intractable vomiting, chest pain, shortness of breath, weakness, numbness, changes in speech, facial asymmetry, abdominal pain, passing out, Inability to tolerate liquids or food, cough, altered mental status or any concerns. No signs of systemic illness or infection. The patient is nontoxic-appearing on exam and vital signs are within normal limits.  I have reviewed the triage vital signs and the nursing notes. Pertinent labs & imaging results that were available during my care of the patient were reviewed by me and considered in my medical decision making (see chart for details). After history, exam, and medical workup I feel the patient has been appropriately medically screened and is safe for discharge home. Pertinent diagnoses were discussed with the patient. Patient was given return precautions.  Rx / DC Orders ED Discharge Orders     None         Carnie Bruemmer, MD 03/18/22 [id number]

## 2022-03-18 NOTE — ED Triage Notes (Addendum)
Pt in with fever, cough and congestion x 2 days. States her R lung hurts, has productive cough with clear sputum. States she had a flu and covid vaccine 2wks ago.

## 2022-03-22 ENCOUNTER — Telehealth: Payer: Self-pay | Admitting: *Deleted

## 2022-03-22 NOTE — Telephone Encounter (Signed)
Transition Care Management Follow-up Telephone Call Date of discharge and from where: 03/18/2022 [facility] ED How have you been since you were released from the hospital? Patient states she feel sok Any questions or concerns? No  Items Reviewed: Did the pt receive and understand the discharge instructions provided? Yes  Medications obtained and verified? No  Other? No  Any new allergies since your discharge? No  Dietary orders reviewed? No Do you have support at home? Yes   Home Care and Equipment/Supplies: Were home health services ordered? not applicable If so, what is the name of the agency? N/a  Has the agency set up a time to come to the patient's home? not applicable Were any new equipment or medical supplies ordered?  No What is the name of the medical supply agency? N/a Were you able to get the supplies/equipment? not applicable Do you have any questions related to the use of the equipment or supplies? No  Functional Questionnaire: (I = Independent and D = Dependent) ADLs: I  Bathing/Dressing- I  Meal Prep- I  Eating- I  Maintaining continence- I  Transferring/Ambulation- I  Managing Meds- I  Follow up appointments reviewed:  PCP Hospital f/u appt confirmed? Yes  Scheduled to see Dr. Mitchel Honour on 03/29/2022 @ 10:40am. [facility] f/u appt confirmed? No  Scheduled to see n/a on n/a @ n/a. Are transportation arrangements needed? Yes  If their condition worsens, is the pt aware to call PCP or go to the Emergency Dept.? Yes Was the patient provided with contact information for the PCP's office or ED? Yes Was to pt encouraged to call back with questions or concerns? Yes

## 2022-03-27 ENCOUNTER — Ambulatory Visit: Payer: Medicare Other | Admitting: Emergency Medicine

## 2022-03-29 ENCOUNTER — Encounter: Payer: Self-pay | Admitting: Emergency Medicine

## 2022-03-29 ENCOUNTER — Ambulatory Visit (INDEPENDENT_AMBULATORY_CARE_PROVIDER_SITE_OTHER): Payer: Medicare Other | Admitting: Emergency Medicine

## 2022-03-29 VITALS — BP 116/60 | HR 68 | Temp 99.0°F | Ht 60.0 in | Wt 199.0 lb

## 2022-03-29 DIAGNOSIS — N1832 Chronic kidney disease, stage 3b: Secondary | ICD-10-CM

## 2022-03-29 DIAGNOSIS — U071 COVID-19: Secondary | ICD-10-CM | POA: Diagnosis not present

## 2022-03-29 DIAGNOSIS — R051 Acute cough: Secondary | ICD-10-CM | POA: Insufficient documentation

## 2022-03-29 DIAGNOSIS — I10 Essential (primary) hypertension: Secondary | ICD-10-CM

## 2022-03-29 MED ORDER — HYDROCODONE BIT-HOMATROP MBR 5-1.5 MG/5ML PO SOLN: 5.0000 mL | Freq: Every evening | ORAL | 0 refills | Status: DC | PRN

## 2022-03-29 MED ORDER — BENZONATATE 200 MG PO CAPS: 200.0000 mg | ORAL_CAPSULE | Freq: Two times a day (BID) | ORAL | 0 refills | Status: DC | PRN

## 2022-03-29 NOTE — Assessment & Plan Note (Signed)
Advised to stay well-hydrated and avoid NSAIDs. ?

## 2022-03-29 NOTE — Progress Notes (Signed)
Megan Bradford 71 y.o.   Chief Complaint  Patient presents with   Follow-up    E/D f/u COVID x 2 weeks, patient states she is still fatigued. Patient still have a cough     HISTORY OF PRESENT ILLNESS: This is a 71 y.o. female complaining of persistent cough and malaise since coming down with COVID infection 3 weeks ago. No other associated symptoms.  No other complications. No other complaints or medical concerns today.  HPI   Prior to Admission medications   Medication Sig Start Date End Date Taking? Authorizing Provider  acetaminophen (TYLENOL) 650 MG CR tablet Take 650 mg by mouth every 8 (eight) hours as needed for pain.   Yes [provider]  amLODipine (NORVASC) 5 MG tablet Take 5 mg by mouth daily.   Yes [provider]  Apoaequorin (PREVAGEN) 10 MG CAPS Take 10 mg by mouth daily.   Yes [provider]  aspirin EC 81 MG tablet Take 81 mg by mouth daily. Swallow whole.   Yes [provider]  benzocaine-Menthol (DERMOPLAST) 20-0.5 % AERO Apply 1 Application topically 4 (four) times daily as needed for irritation. 10/28/21  Yes Waymon Amato, MD  carvedilol (COREG) 12.5 MG tablet Take 12.5 mg by mouth 2 (two) times daily with a meal.   Yes [provider]  cetirizine (ZYRTEC) 10 MG tablet Take 10 mg by mouth daily.   Yes [provider]  Cholecalciferol (VITAMIN D3) 10 MCG (400 UNIT) CAPS Take 400 Units by mouth daily.   Yes [provider]  Coenzyme Q10 (CO Q10 PO) Take 1 capsule by mouth daily.   Yes [provider]  diclofenac Sodium (VOLTAREN) 1 % GEL Apply 2 g topically 4 (four) times daily. Patient taking differently: Apply 2 g topically daily as needed (pain). 12/01/19  Yes Wieters, Hallie C, [name]  lisinopril (ZESTRIL) 5 MG tablet Take 5 mg by mouth daily. 03/26/21  Yes [provider]  LORazepam (ATIVAN) 2 MG tablet Take 2 mg by mouth 2 (two) times daily as needed. 01/10/22  Yes [provider]  magnesium oxide (MAG-OX) 400 MG tablet Take 400 mg by mouth daily.   Yes [provider]  multivitamin-lutein (OCUVITE-LUTEIN) CAPS capsule Take 1 capsule by mouth daily.   Yes [provider]  mycophenolate (MYFORTIC) 180 MG EC tablet Take 180 mg by mouth 2 (two) times daily.   Yes [provider]  rosuvastatin (CRESTOR) 20 MG tablet Take 1 tablet (20 mg total) by mouth daily. 12/20/20  Yes [name], MD  tacrolimus (PROGRAF) 1 MG capsule TAKE 4 CAPSULES BY MOUTH IN THE MORNING. 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   Adrenal mass, left ([place]) 02/02/2022   Diverticulosis 02/02/2022   Hospital discharge follow-up 10/20/2021   Dyslipidemia 10/20/2021   Chronic diastolic CHF (congestive heart failure) ([place]) 10/14/2021   Mixed hyperlipidemia 10/14/2021   Diarrhea 10/14/2021   Chest pain 10/13/2021   Dyssynergic defecation    Incontinence of feces with fecal urgency    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/01/2020   Trigger finger, right middle finger 10/28/2020   Coronary artery disease involving native coronary artery of native heart 08/30/2020   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/30/2020   Chronic kidney disease, stage 3b ([place]) 12/17/2019  History of kidney transplant 11/27/2019   Essential hypertension 11/27/2019   Prediabetes 11/27/2019   Obesity (BMI 30-39.9) 11/27/2019    Past Medical History:  Diagnosis Date   Cataract    Phreesia 11/24/2019- removed both   Chronic kidney disease    Phreesia 11/24/2019   Clotting disorder ([place])    PE 2010 per pt there really was not a PE   Fistula    LUE; was on dialysis for 11 months   Heart murmur    Hypertension    Phreesia 11/24/2019   Kidney transplanted    transplant team [facility]  [place])   Pre-diabetes    no meds   Pulmonary embolism ([place]) 2010   never symptomatic    Past Surgical History:  Procedure Laterality Date   ANAL RECTAL MANOMETRY N/A 08/03/2021   Procedure: ANO RECTAL MANOMETRY;  Surgeon: Thornton Park, MD;  Location: [facility];  Service: Gastroenterology;  Laterality: N/A;   CARDIAC CATHETERIZATION     x2   CESAREAN SECTION N/A    Phreesia 11/24/2019   CHOLECYSTECTOMY N/A    Phreesia 11/24/2019   COLONOSCOPY     DILATATION & CURETTAGE/HYSTEROSCOPY WITH MYOSURE N/A 10/28/2021   Procedure: DILATATION & CURETTAGE/HYSTEROSCOPY;  Surgeon: Waymon Amato, MD;  Location: [place];  Service: Gynecology;  Laterality: N/A;   DILATION AND CURETTAGE OF UTERUS     EYE SURGERY N/A    Cataracts removed Phreesia 11/24/2019   HIP SURGERY Left 1990   Hip dysplasia cleaned out per patient   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   LAPAROSCOPIC SALPINGO OOPHERECTOMY Bilateral 10/28/2021   Procedure: LAPAROSCOPIC SALPINGO OOPHORECTOMY WITH PELVIC WASHINGS;  Surgeon: Waymon Amato, MD;  Location: [place];  Service: Gynecology;  Laterality: Bilateral;   POLYPECTOMY     HPP   TONSILLECTOMY     TRANSFORAMINAL LUMBAR INTERBODY FUSION (TLIF) WITH PEDICLE SCREW FIXATION 1 LEVEL N/A 12/01/2020   Procedure: Transiforaminal Lumbar Interbody Fusion Lumbar four--Lumbar five;  Surgeon: Vallarie Mare, MD;  Location: [place];  Service: Neurosurgery;  Laterality: N/A;   TRIGGER FINGER RELEASE Right 10/28/2020   Long finger   VULVAR LESION REMOVAL N/A 10/28/2021   Procedure: COMPLETE RESECTION OF VULVAR WARTS;  Surgeon: Waymon Amato, MD;  Location: [place];  Service: Gynecology;  Laterality: N/A;    Social History   Socioeconomic History   Marital status: Widowed    Spouse name: Not on file   Number of children: Not on file   Years of education: Not on file   Highest education level: Not on file  Occupational History   Not on  file  Tobacco Use   Smoking status: Never   Smokeless tobacco: Never  Vaping Use   Vaping Use: Never used  Substance and Sexual Activity   Alcohol use: Never   Drug use: Never   Sexual activity: Not Currently    Birth control/protection: None  Other Topics Concern   Not on file  Social History Narrative   Not on file   Social Determinants of Health   Financial Resource Strain: Low Risk  (08/25/2021)   Overall Financial Resource Strain (CARDIA)    Difficulty of Paying Living Expenses: Not hard at all  Food Insecurity: No Food Insecurity (08/25/2021)   Hunger Vital Sign    Worried About Running Out of Food in the Last Year: Never true    Ran Out of Food in the Last Year: Never true  Transportation Needs:  No Transportation Needs (08/25/2021)   PRAPARE - Hydrologist (Medical): No    Lack of Transportation (Non-Medical): No  Physical Activity: Sufficiently Active (08/25/2021)   Exercise Vital Sign    Days of Exercise per Week: 5 days    Minutes of Exercise per Session: 30 min  Stress: No Stress Concern Present (08/25/2021)   [place]    Feeling of Stress : Not at all  Social Connections: Moderately Integrated (08/25/2021)   Social Connection and Isolation Panel [NHANES]    Frequency of Communication with Friends and Family: More than three times a week    Frequency of Social Gatherings with Friends and Family: More than three times a week    Attends Religious Services: More than 4 times per year    Active Member of [employer] or Organizations: Yes    Attends Archivist Meetings: More than 4 times per year    Marital Status: Widowed  Intimate Partner Violence: Not At Risk (08/25/2021)   Humiliation, Afraid, Rape, and Kick questionnaire    Fear of Current or Ex-Partner: No    Emotionally Abused: No    Physically Abused: No    Sexually Abused: No    Family History  Problem  Relation Age of Onset   Diabetes Mother    Asthma Mother    Hypertension Father    Coronary artery disease Father    Colon cancer Neg Hx    Colon polyps Neg Hx    Esophageal cancer Neg Hx    Rectal cancer Neg Hx    Stomach cancer Neg Hx    Breast cancer Neg Hx    Ovarian cancer Neg Hx    Endometrial cancer Neg Hx    Pancreatic cancer Neg Hx    Prostate cancer Neg Hx      Review of Systems  Constitutional:  Positive for malaise/fatigue.  HENT:  Positive for congestion.   Respiratory:  Positive for cough.   Cardiovascular: Negative.  Negative for chest pain and palpitations.  Gastrointestinal:  Negative for abdominal pain, diarrhea, nausea and vomiting.  Genitourinary: Negative.   Neurological: Negative.  Negative for dizziness and headaches.  All other systems reviewed and are negative.   Today's Vitals   03/29/22 1018  BP: 116/60  Pulse: 68  Temp: 99 F (37.2 C)  TempSrc: Oral  SpO2: 93%  Weight: 199 lb (90.3 kg)  Height: 5' (1.524 m)   Body mass index is 38.86 kg/m. Wt Readings from Last 3 Encounters:  03/29/22 199 lb (90.3 kg)  03/18/22 189 lb (85.7 kg)  02/02/22 200 lb (90.7 kg)    Physical Exam Vitals reviewed.  Constitutional:      Appearance: Normal appearance.  HENT:     Head: Normocephalic.  Eyes:     Extraocular Movements: Extraocular movements intact.     Pupils: Pupils are equal, round, and reactive to light.  Cardiovascular:     Rate and Rhythm: Normal rate and regular rhythm.     Pulses: Normal pulses.     Heart sounds: Normal heart sounds.  Pulmonary:     Effort: Pulmonary effort is normal.     Breath sounds: Normal breath sounds.  Musculoskeletal:     Cervical back: No tenderness.  Lymphadenopathy:     Cervical: No cervical adenopathy.  Skin:    General: Skin is warm and dry.  Neurological:     General: No focal deficit present.  Mental Status: She is alert and oriented to person, place, and time.  Psychiatric:        Mood  and Affect: Mood normal.        Behavior: Behavior normal.      ASSESSMENT & PLAN: A total of 48 minutes was spent with the patient and counseling/coordination of care regarding preparing for this visit, review of most recent office visit notes, review of most recent emergency department visit notes, review of multiple chronic medical problems and their management, review of all medications, cough management, diagnosis of COVID infection and postviral syndrome symptoms, prognosis, documentation, and need for follow-up.  Problem List Items Addressed This Visit       Cardiovascular and Mediastinum   Essential hypertension    Well-controlled hypertension.  Continue amlodipine 5 mg, carvedilol 12.5 mg twice a day, and lisinopril 5 mg daily. BP Readings from Last 3 Encounters:  03/29/22 116/60  03/18/22 (!) 135/98  02/02/22 103/62          Genitourinary   Chronic kidney disease, stage 3b ([place])    Advised to stay well-hydrated and avoid NSAIDs.        Other   COVID-19 virus infection - Primary    Stable.  No complications.  Running its course. Tolerating it well. Still has persistent symptoms 3 weeks into the illness. No red flag signs or symptoms. Advised to rest and stay well-hydrated.      Acute cough    Continue over-the-counter Mucinex DM and cough drops. Recommend to start Tessalon 200 mg 3 times a day as needed and Hycodan syrup at bedtime as needed.      Relevant Medications   benzonatate (TESSALON) 200 MG capsule   HYDROcodone bit-homatropine (HYCODAN) 5-1.5 MG/5ML syrup   Patient Instructions  Cough, Adult A cough helps to clear your throat and lungs. A cough may be a sign of an illness or another medical condition. An acute cough may only last 2-3 weeks, while a chronic cough may last 8 or more weeks. Many things can cause a cough. They include: Germs (viruses or bacteria) that attack the airway. Breathing in things that bother (irritate) your  lungs. Allergies. Asthma. Mucus that runs down the back of your throat (postnasal drip). Smoking. Acid backing up from the stomach into the tube that moves food from the mouth to the stomach (gastroesophageal reflux). Some medicines. Lung problems. Other medical conditions, such as heart failure or a blood clot in the lung (pulmonary embolism). Follow these instructions at home: Medicines Take over-the-counter and prescription medicines only as told by your doctor. Talk with your doctor before you take medicines that stop a cough (cough suppressants). Lifestyle  Do not smoke, and try not to be around smoke. Do not use any products that contain nicotine or tobacco, such as cigarettes, e-cigarettes, and chewing tobacco. If you need help quitting, ask your doctor. Drink enough fluid to keep your pee (urine) pale yellow. Avoid caffeine. Do not drink alcohol if your doctor tells you not to drink. General instructions  Watch for any changes in your cough. Tell your doctor about them. Always cover your mouth when you cough. Stay away from things that make you cough, such as perfume, candles, campfire smoke, or cleaning products. If the air is dry, use a cool mist vaporizer or humidifier in your home. If your cough is worse at night, try using extra pillows to raise your head up higher while you sleep. Rest as needed. Keep all follow-up visits  as told by your doctor. This is important. Contact a doctor if: You have new symptoms. You cough up pus. Your cough does not get better after 2-3 weeks, or your cough gets worse. Cough medicine does not help your cough and you are not sleeping well. You have pain that gets worse or pain that is not helped with medicine. You have a fever. You are losing weight and you do not know why. You have night sweats. Get help right away if: You cough up blood. You have trouble breathing. Your heartbeat is very fast. These symptoms may be an emergency. Do  not wait to see if the symptoms will go away. Get medical help right away. Call your local emergency services (911 in the [place] Do not drive yourself to the hospital. Summary A cough helps to clear your throat and lungs. Many things can cause a cough. Take over-the-counter and prescription medicines only as told by your doctor. Always cover your mouth when you cough. Contact a doctor if you have new symptoms or you have a cough that does not get better or gets worse. This information is not intended to replace advice given to you by your health care provider. Make sure you discuss any questions you have with your health care provider. Document Revised: 06/20/2019 Document Reviewed: 05/20/2018 Elsevier Patient Education  [place], MD [facility]

## 2022-03-29 NOTE — Assessment & Plan Note (Signed)
Continue over-the-counter Mucinex DM and cough drops. Recommend to start Tessalon 200 mg 3 times a day as needed and Hycodan syrup at bedtime as needed.

## 2022-03-29 NOTE — Assessment & Plan Note (Signed)
Stable.  No complications.  Running its course. Tolerating it well. Still has persistent symptoms 3 weeks into the illness. No red flag signs or symptoms. Advised to rest and stay well-hydrated.

## 2022-03-29 NOTE — Patient Instructions (Signed)

## 2022-03-29 NOTE — Assessment & Plan Note (Signed)
Well-controlled hypertension.  Continue amlodipine 5 mg, carvedilol 12.5 mg twice a day, and lisinopril 5 mg daily. BP Readings from Last 3 Encounters:  03/29/22 116/60  03/18/22 (!) 135/98  02/02/22 103/62

## 2022-04-30 IMAGING — MR MR LUMBAR SPINE W/O CM
4 of 5 series · 25 of 48 positions shown · non-contrast
Comparison: Lumbar spine x-rays dated September 08, 2020. CT chest
dated June 17, 2020.

CLINICAL DATA: Chronic low back pain radiating into both legs with
difficulty walking. No injury or prior surgery.

EXAM:
MRI LUMBAR SPINE WITHOUT CONTRAST
TECHNIQUE: Multiplanar, multisequence MR imaging of the lumbar spine was
performed. No intravenous contrast was administered.

[Series 3: T2 · sagittal · 4.0mm · 0.53mm/px · 6 of 18 slices shown (1 of 2)]
[im 1/18]
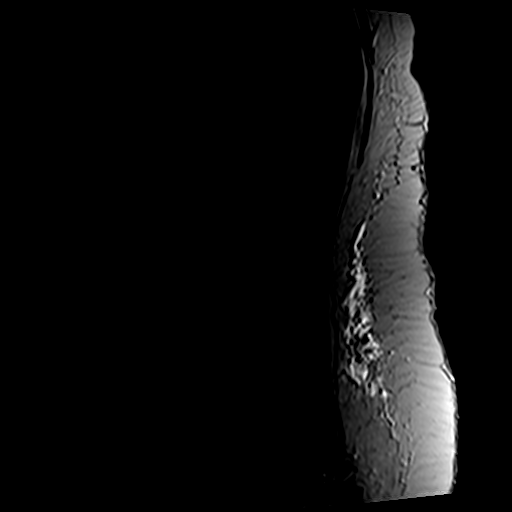
[im 4/18]
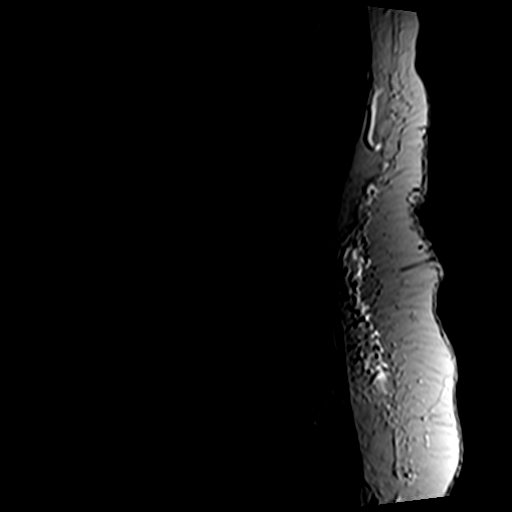
[im 7/18]
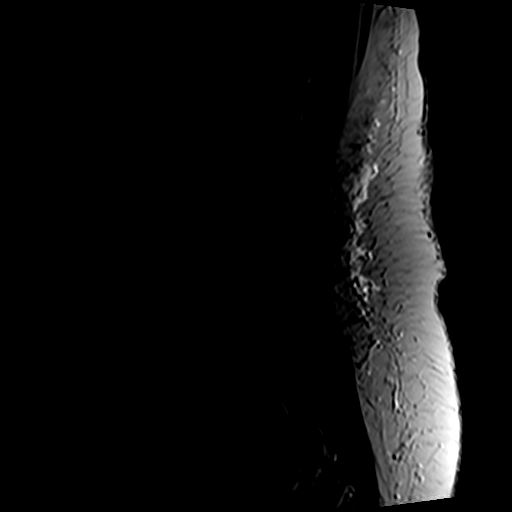
[im 11/18]
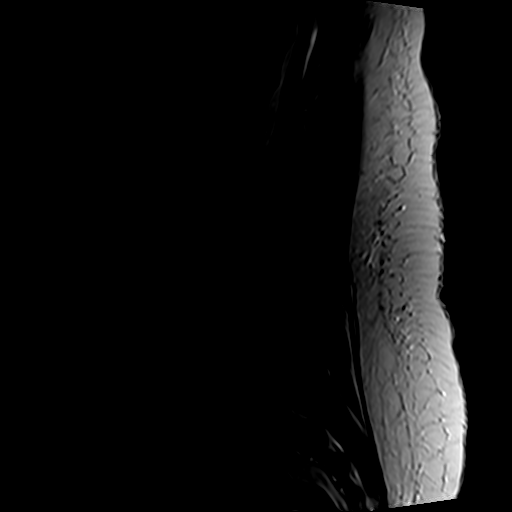
[im 14/18]
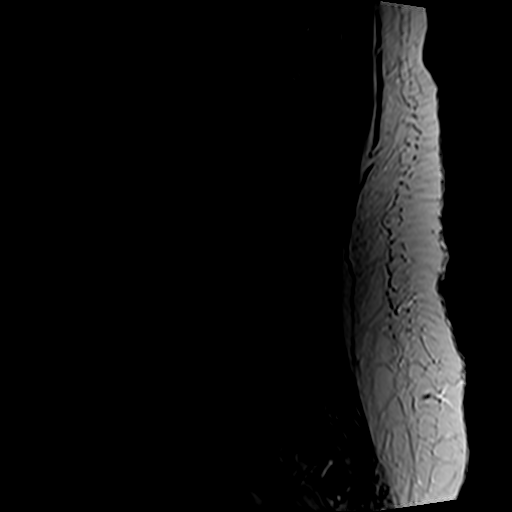
[im 18/18]
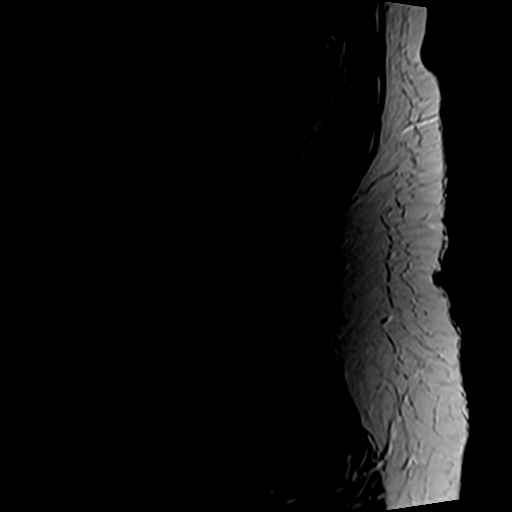

[Series 5: T1 · sagittal · 4.0mm · 0.53mm/px · 7 of 18 slices shown (1 of 2)]
[im 1/18]
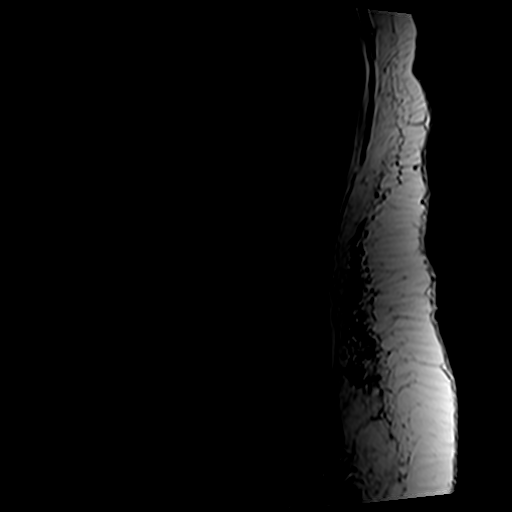
[im 3/18]
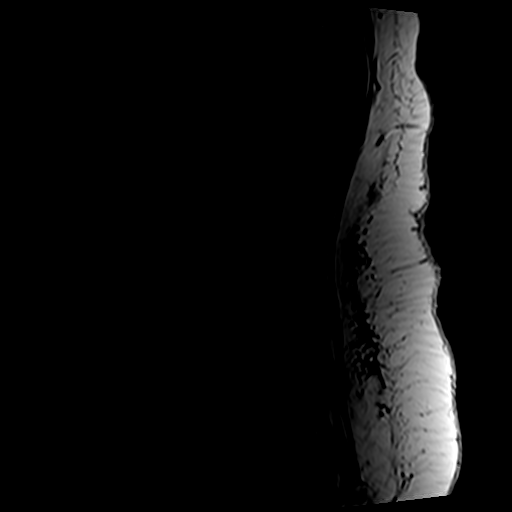
[im 6/18]
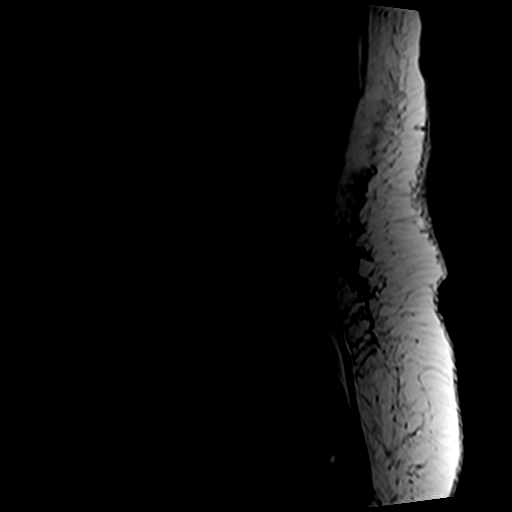
[im 9/18]
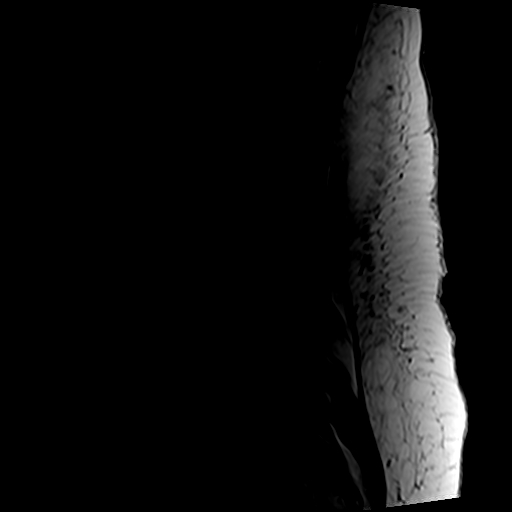
[im 12/18]
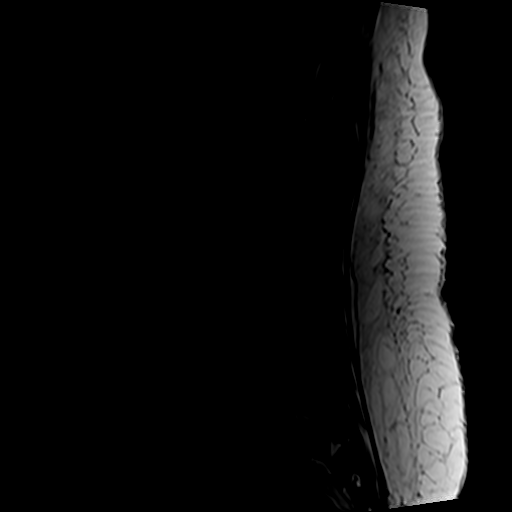
[im 15/18]
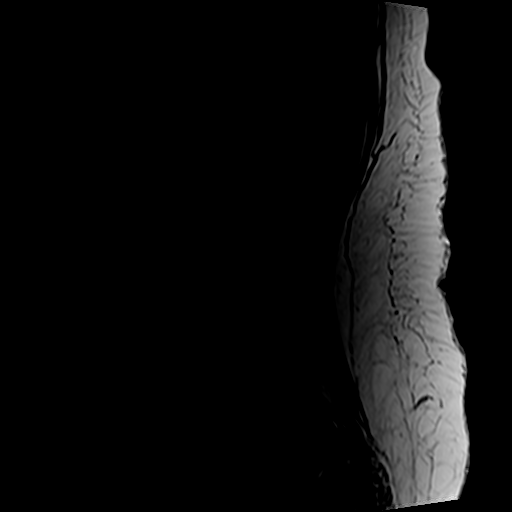
[im 18/18]
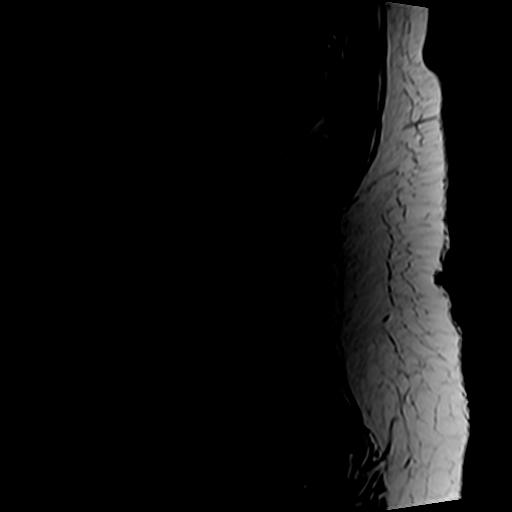

[Series 6: T2 · axial · 4.0mm · 0.70mm/px · z∈[-62,+131]mm · 8 of 36 slices shown (2 of 2)]
[im 1/36]
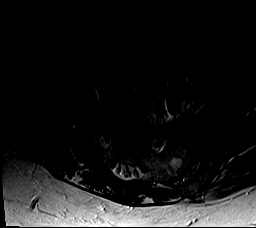
[im 6/36]
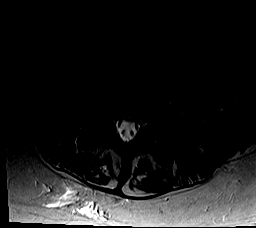
[im 11/36]
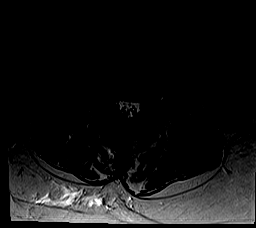
[im 17/36]
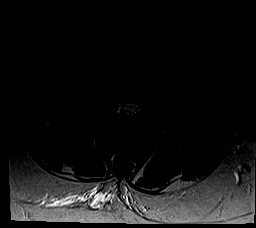
[im 19/36]
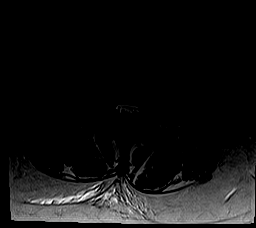
[im 25/36]
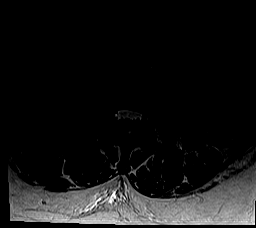
[im 30/36]
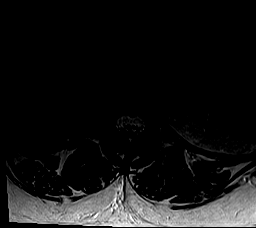
[im 36/36]
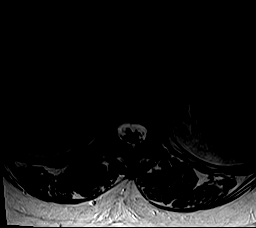

[Series 7: T1 · axial · 4.0mm · 0.35mm/px · z∈[-62,+105]mm · 4 of 36 slices shown (2 of 2)]
[im 1/36]
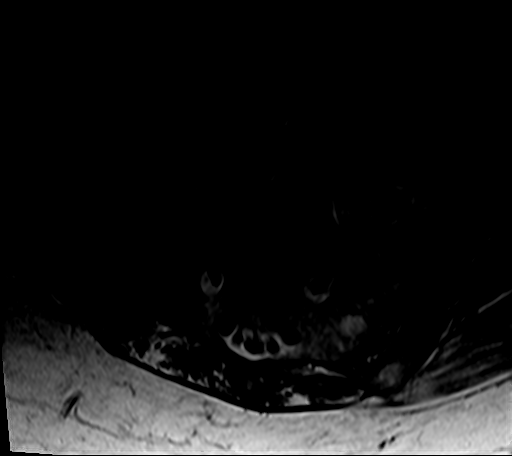
[im 6/36]
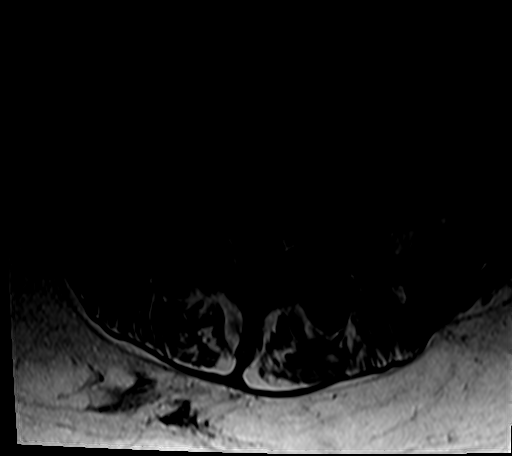
[im 19/36]
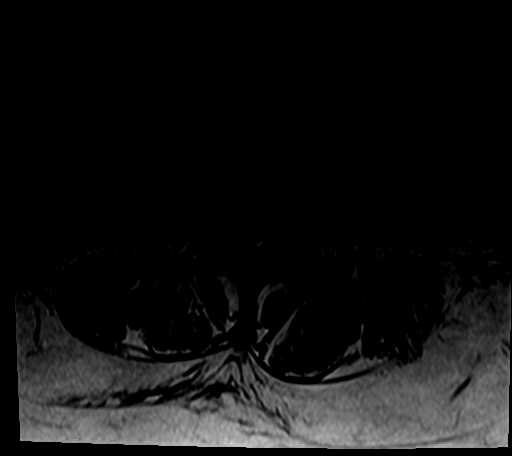
[im 30/36]
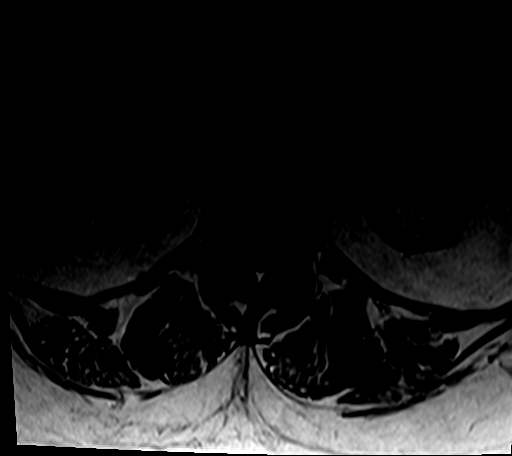

[25 of 48 positions shown; findings below may reference images not displayed]

FINDINGS: Segmentation:  Standard.

Alignment: 6 mm anterolisthesis at L4-L5. Trace anterolisthesis at
L5-S1.

Vertebrae:  No fracture, evidence of discitis, or bone lesion.

Conus medullaris and cauda equina: Conus extends to the T12-L1
level. Conus and cauda equina appear normal.

Paraspinal and other soft tissues: Severe bilateral renal atrophy
again noted. Unchanged 2.5 cm left adrenal nodule.

Disc levels:

T12-L1:  Tiny central disc protrusion.  No stenosis.

L1-L2:  Negative.

L2-L3:  Mild disc bulging.  No stenosis.

L3-L4: Mild disc bulging and bilateral facet arthropathy with
ligamentum flavum hypertrophy. Mild to moderate spinal canal
stenosis and bilateral neuroforaminal stenosis.

L4-L5: Disc uncovering and mild disc bulging. Severe bilateral facet
arthropathy with ligamentum flavum hypertrophy. Severe spinal canal
and bilateral lateral recess stenosis. Moderate bilateral
neuroforaminal stenosis.

L5-S1: Mild disc bulging with superimposed left far lateral disc
protrusion encroaching on the exiting left L5 nerve root. Severe
bilateral facet arthropathy. Moderate left and mild right
neuroforaminal stenosis. No spinal canal stenosis.
IMPRESSION: 1. Multilevel lumbar spondylosis as described above, worst at L4-L5
where there is severe spinal canal and moderate bilateral
neuroforaminal stenosis.
2. Mild to moderate spinal canal stenosis at L3-L4.
3. Left far lateral disc protrusion at L5-S1 encroaching on the
exiting left L5 nerve root.
4. Unchanged 2.5 cm left adrenal nodule, indeterminate. As stated on
prior chest CT from June 2020, follow-up adrenal protocol CT
with and without contrast is recommended for further
characterization.

## 2022-04-30 IMAGING — MR MR HIP*L* W/O CM
4 of 5 series · 31 of 40 positions shown · non-contrast
Comparison: Radiographs 09/08/2020.  No other comparison studies.

CLINICAL DATA: Chronic left hip pain.  Osteoarthritis suspected.

EXAM:
MR OF THE LEFT HIP WITHOUT CONTRAST
TECHNIQUE: Multiplanar, multisequence MR imaging was performed. No intravenous
contrast was administered.

[Series 8: PD fat-sat · coronal · 4.0mm · 0.74mm/px · 7 of 25 slices shown (1 of 2)]
[im 1/25]
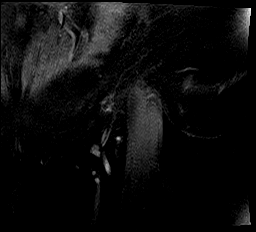
[im 5/25]
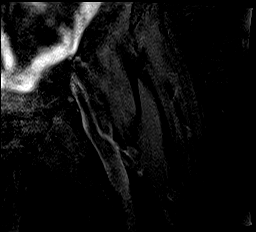
[im 9/25]
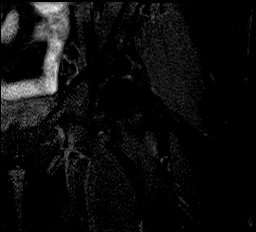
[im 13/25]
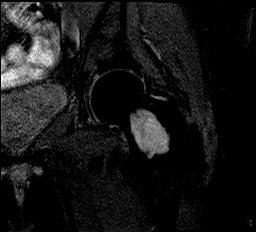
[im 17/25]
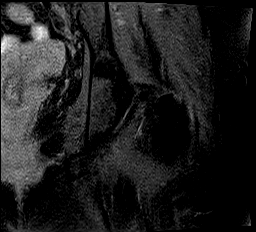
[im 21/25]
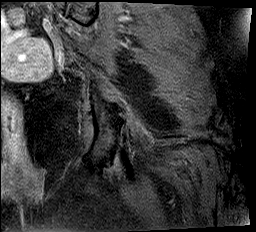
[im 25/25]
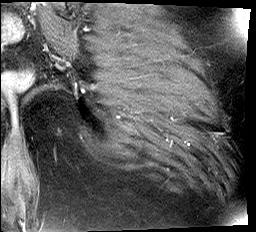

[Series 9: T2 fat-sat · axial · 4.0mm · 0.82mm/px · z∈[-194,-34]mm · 9 of 33 slices shown (1 of 2)]
[im 1/33]
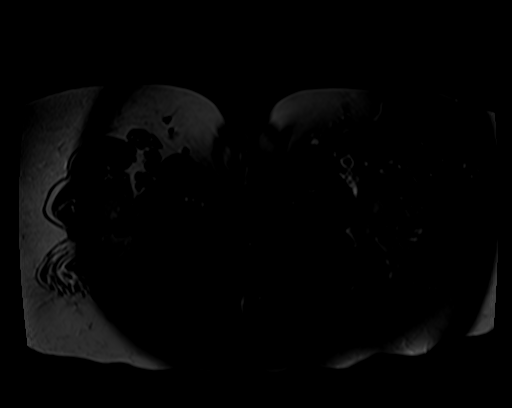
[im 5/33]
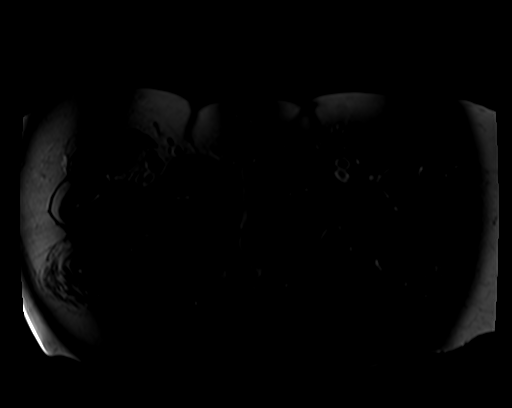
[im 9/33]
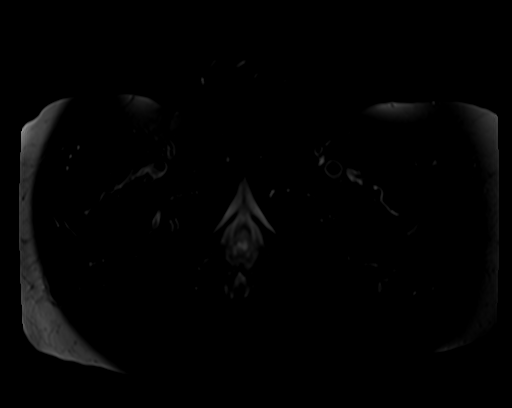
[im 13/33]
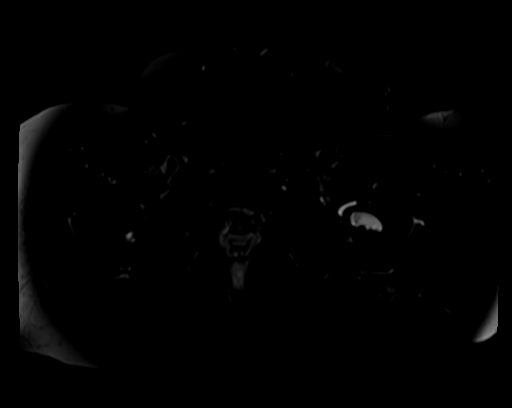
[im 17/33]
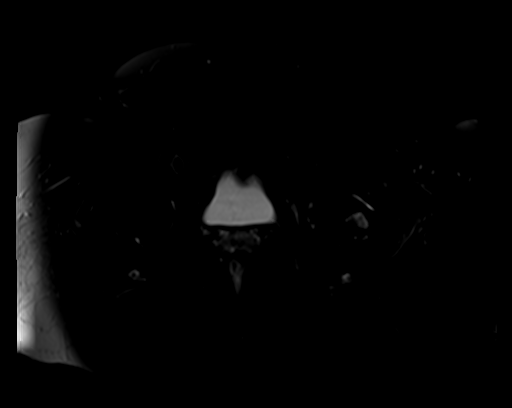
[im 21/33]
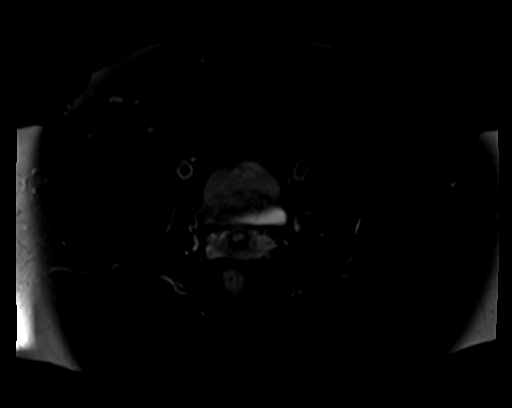
[im 25/33]
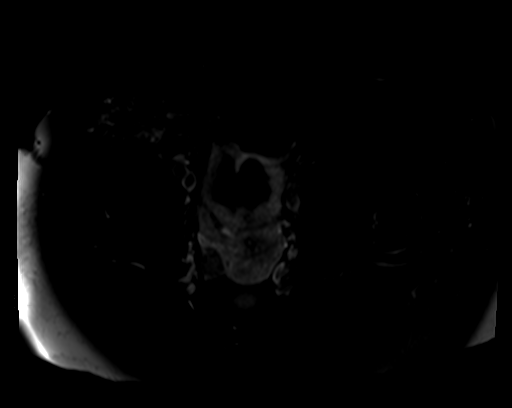
[im 29/33]
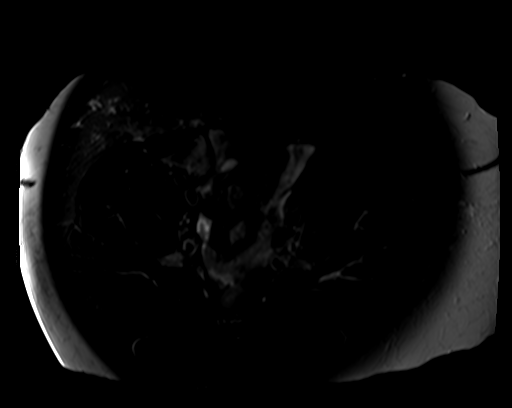
[im 33/33]
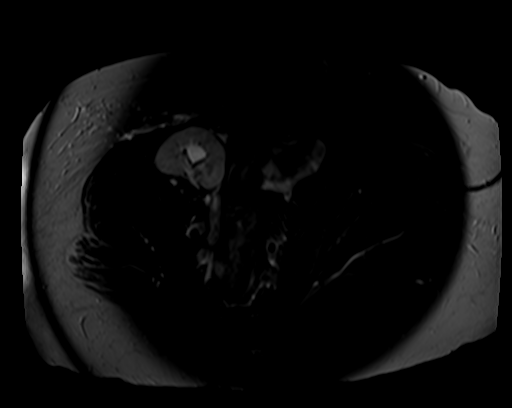

[Series 10: PD fat-sat · sagittal · 4.0mm · 0.70mm/px · 9 of 32 slices shown (2 of 2)]
[im 1/32]
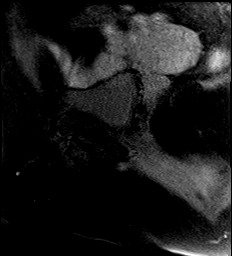
[im 4/32]
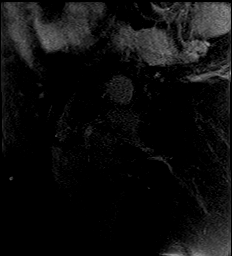
[im 8/32]
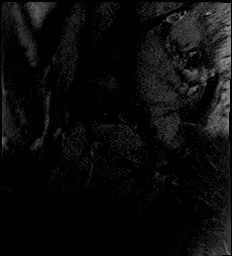
[im 12/32]
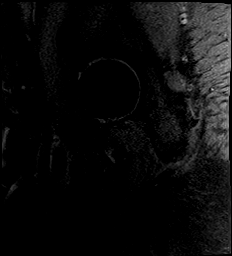
[im 16/32]
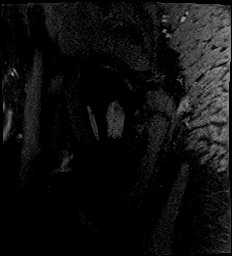
[im 20/32]
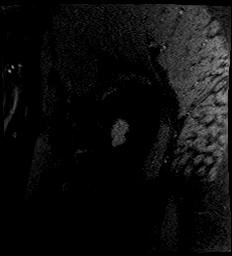
[im 24/32]
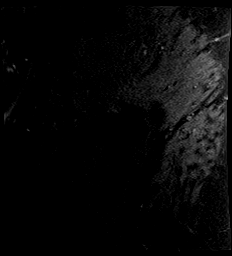
[im 28/32]
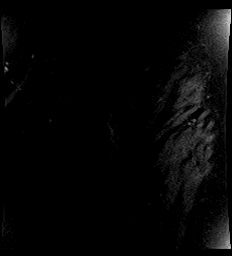
[im 32/32]
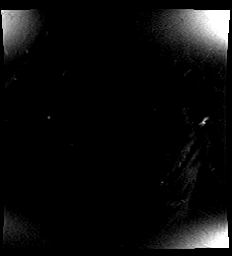

[Series 11: T2 fat-sat · coronal · 4.0mm · 0.88mm/px · 6 of 28 slices shown (2 of 2)]
[im 1/28]
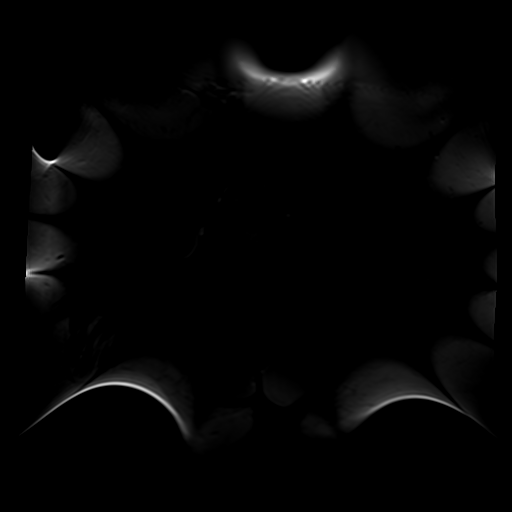
[im 4/28]
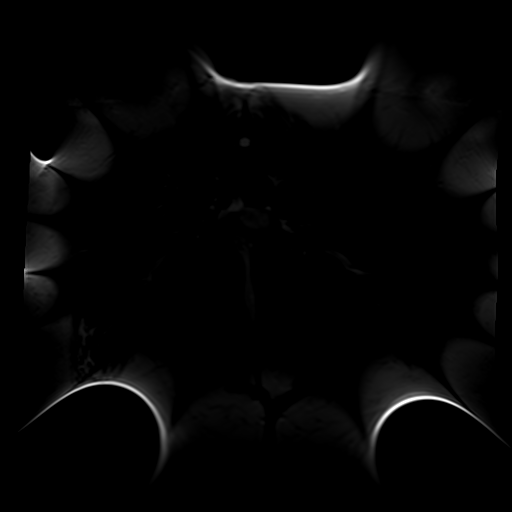
[im 8/28]
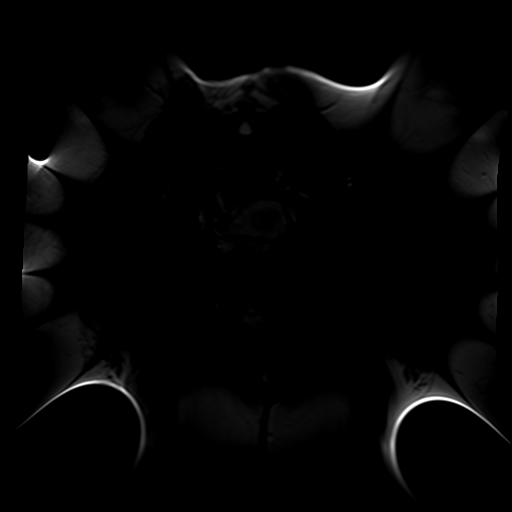
[im 12/28]
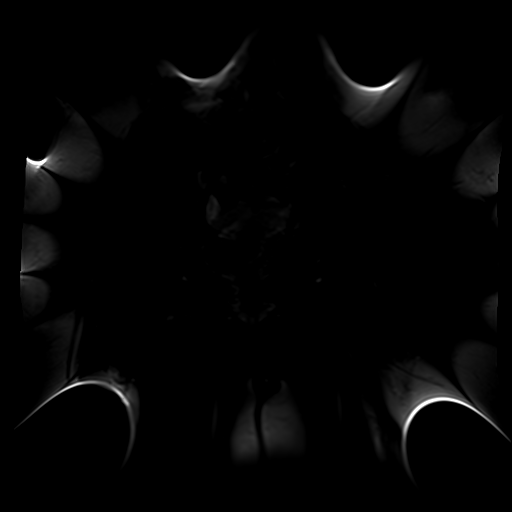
[im 16/28]
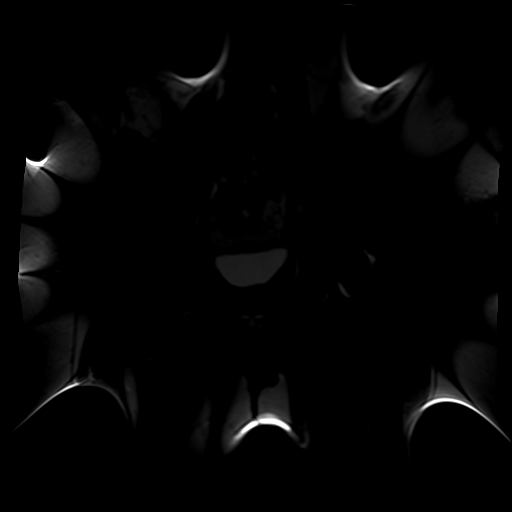
[im 24/28]
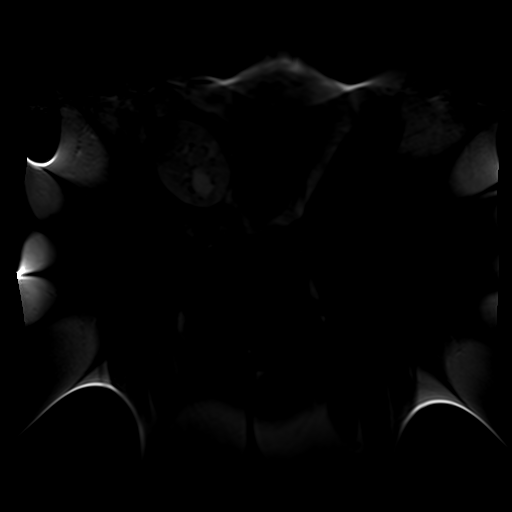

[31 of 40 positions shown; findings below may reference images not displayed]

FINDINGS: Bones: Radiographs demonstrate a sharply demarcated lucent lesion in
the intertrochanteric region of the left femur with sclerotic
margins. On MRI, this demonstrates central T2 hyperintensity and
sclerotic peripheral margins. No endosteal thinning, periosteal
reaction or associated pathologic fracture. There is prominent
spurring or heterotopic ossification extending inferiorly from the
left greater trochanter. No other focal osseous lesions are
identified. The visualized sacroiliac joints and symphysis pubis
appear normal.

Articular cartilage and labrum

Articular cartilage: No focal chondral defect or subchondral signal
abnormality identified.

Labrum: There is no gross labral tear or paralabral abnormality.

Joint or bursal effusion

Joint effusion: Small left hip joint effusion.

Bursae: No focal periarticular fluid collection.

Muscles and tendons

Muscles and tendons: Mild gluteus tendinosis bilaterally without
tear. The common hamstring and iliopsoas tendons appear intact. The
piriformis muscles are symmetric.

Other findings

Miscellaneous: Presumed renal transplant in the right iliac fossa.
The visualized internal pelvic contents are otherwise unremarkable.
IMPRESSION: 1. No acute osseous findings or significant arthropathic changes at
either hip. There is a small nonspecific left hip joint effusion.
2. Indeterminate lesion in the intertrochanteric region of the left
femur is well-defined with sclerotic margins, and appears
nonaggressive, likely representing a benign finding, potentially
fibrous dysplasia. Given apparent heterotopic ossification extending
inferiorly from the left greater trochanter, this could reflect a
posttraumatic finding. If there are no prior radiographs to document
the stability of this finding, follow-up recommended to assess
stability. Suggest MR follow-up without and with contrast in 3-4
months. Renal insufficiency does not preclude the use of group 1
gadolinium based contrast agents.
3. Mild gluteus tendinosis bilaterally.

## 2022-05-29 ENCOUNTER — Other Ambulatory Visit: Payer: Self-pay

## 2022-05-29 ENCOUNTER — Encounter (HOSPITAL_COMMUNITY): Payer: Self-pay

## 2022-05-29 ENCOUNTER — Emergency Department (HOSPITAL_COMMUNITY): Payer: 59

## 2022-05-29 ENCOUNTER — Emergency Department (HOSPITAL_COMMUNITY)
Admission: EM | Admit: 2022-05-29 | Discharge: 2022-05-29 | Disposition: A | Discharge status: Home / Self Care | Payer: 59 | Attending: Emergency Medicine | Admitting: Emergency Medicine

## 2022-05-29 DIAGNOSIS — Z79899 Other long term (current) drug therapy: Secondary | ICD-10-CM | POA: Insufficient documentation

## 2022-05-29 DIAGNOSIS — S060X0A Concussion without loss of consciousness, initial encounter: Secondary | ICD-10-CM | POA: Insufficient documentation

## 2022-05-29 DIAGNOSIS — Z7982 Long term (current) use of aspirin: Secondary | ICD-10-CM | POA: Diagnosis not present

## 2022-05-29 DIAGNOSIS — N189 Chronic kidney disease, unspecified: Secondary | ICD-10-CM | POA: Insufficient documentation

## 2022-05-29 DIAGNOSIS — W228XXA Striking against or struck by other objects, initial encounter: Secondary | ICD-10-CM | POA: Diagnosis not present

## 2022-05-29 DIAGNOSIS — I129 Hypertensive chronic kidney disease with stage 1 through stage 4 chronic kidney disease, or unspecified chronic kidney disease: Secondary | ICD-10-CM | POA: Insufficient documentation

## 2022-05-29 DIAGNOSIS — R519 Headache, unspecified: Secondary | ICD-10-CM | POA: Diagnosis present

## 2022-05-29 MED ORDER — LORAZEPAM 1 MG PO TABS
2.0000 mg | ORAL_TABLET | Freq: Once | ORAL | Status: AC
  Administered 2022-05-29: 2 mg via ORAL
  Filled 2022-05-29: qty 2

## 2022-05-29 MED ORDER — PROCHLORPERAZINE EDISYLATE 10 MG/2ML IJ SOLN
10.0000 mg | Freq: Once | INTRAMUSCULAR | Status: AC
  Administered 2022-05-29: 10 mg via INTRAMUSCULAR
  Filled 2022-05-29: qty 2

## 2022-05-29 NOTE — Discharge Instructions (Addendum)
Your exam today was overall reassuring.  No concerning cause of your headache.  You were given medication in the emergency department with improvement in your headache.  As discussed you likely have a concussion.  I have given you information for concussion clinic in case your symptoms last longer than a week.  Otherwise headache is expected particularly when you do anything that requires concentration.  You may notice that resting in a dark room is more comfortable.  If you have any severe headache, concerning vision change or vision loss please return to the emergency department.

## 2022-05-29 NOTE — ED Provider Notes (Signed)
[facility] DEPT Provider Note   CSN: [id number] Arrival date & time: 05/29/22  0955     History  Chief Complaint  Patient presents with   Headache    Megan Bradford is a 72 y.o. female.  42-year-old female presents today for evaluation of headache.  She states on Saturday she struck her head against a [employer] processing [employer].  She states she was bent over and as she lifted her head she hit the [employer].  She denies loss of consciousness, anticoagulation use.  She did not fall.  Denies any other injuries.  She states she had significant pain initially which subsided and then the day after she developed headache.  She has been taking Tylenol with mild relief.  Denies nausea, vomiting, or vision changes.  The history is provided by the patient. No language interpreter was used.       Home Medications Prior to Admission medications   Medication Sig Start Date End Date Taking? Authorizing Provider  acetaminophen (TYLENOL) 650 MG CR tablet Take 650 mg by mouth every 8 (eight) hours as needed for pain.    [provider]  amLODipine (NORVASC) 5 MG tablet Take 5 mg by mouth daily.    [provider]  Apoaequorin (PREVAGEN) 10 MG CAPS Take 10 mg by mouth daily.    [provider]  aspirin EC 81 MG tablet Take 81 mg by mouth daily. Swallow whole.    [provider]  benzocaine-Menthol (DERMOPLAST) 20-0.5 % AERO Apply 1 Application topically 4 (four) times daily as needed for irritation. 10/28/21   Waymon Amato, MD  benzonatate (TESSALON) 200 MG capsule Take 1 capsule (200 mg total) by mouth 2 (two) times daily as needed for cough. 03/29/22   Horald Pollen, MD  carvedilol (COREG) 12.5 MG tablet Take 12.5 mg by mouth 2 (two) times daily with a meal.    [provider]  cetirizine (ZYRTEC) 10 MG tablet Take 10 mg by mouth daily.    [provider]  Cholecalciferol (VITAMIN D3) 10 MCG (400 UNIT) CAPS  Take 400 Units by mouth daily.    [provider]  Coenzyme Q10 (CO Q10 PO) Take 1 capsule by mouth daily.    [provider]  diclofenac Sodium (VOLTAREN) 1 % GEL Apply 2 g topically 4 (four) times daily. Patient taking differently: Apply 2 g topically daily as needed (pain). 12/01/19   Wieters, Hallie C, [name]  HYDROcodone bit-homatropine (HYCODAN) 5-1.5 MG/5ML syrup Take 5 mLs by mouth at bedtime as needed for cough. 03/29/22   Horald Pollen, MD  lisinopril (ZESTRIL) 5 MG tablet Take 5 mg by mouth daily. 03/26/21   [provider]  LORazepam (ATIVAN) 2 MG tablet Take 2 mg by mouth 2 (two) times daily as needed. 01/10/22   [provider]  magnesium oxide (MAG-OX) 400 MG tablet Take 400 mg by mouth daily.    [provider]  multivitamin-lutein (OCUVITE-LUTEIN) CAPS capsule Take 1 capsule by mouth daily.    [provider]  mycophenolate (MYFORTIC) 180 MG EC tablet Take 180 mg by mouth 2 (two) times daily.    [provider]  rosuvastatin (CRESTOR) 20 MG tablet Take 1 tablet (20 mg total) by mouth daily. 12/20/20   [name], MD  tacrolimus (PROGRAF) 1 MG capsule TAKE 4 CAPSULES BY MOUTH IN THE MORNING. TAKE 2 CAPSULES BY MOUTH IN THE EVENING 11/23/21   Horald Pollen, MD  Allergies    Keflex [cephalexin]    Review of Systems   Review of Systems  Constitutional:  Negative for chills and fever.  Eyes:  Negative for photophobia and visual disturbance.  Gastrointestinal:  Negative for nausea and vomiting.  Neurological:  Positive for headaches. Negative for syncope and light-headedness.  All other systems reviewed and are negative.   Physical Exam Updated Vital Signs BP 112/80 (BP Location: Right Arm)   Pulse 61   Temp 98.3 F (36.8 C) (Oral)   Resp 16   SpO2 100%  Physical Exam Vitals and nursing note reviewed.  Constitutional:      General: She is not in acute distress.    Appearance: Normal  appearance. She is not ill-appearing.  HENT:     Head: Normocephalic and atraumatic.     Nose: Nose normal.  Eyes:     General: No scleral icterus.    Extraocular Movements: Extraocular movements intact.     Conjunctiva/sclera: Conjunctivae normal.     Pupils: Pupils are equal, round, and reactive to light.  Cardiovascular:     Rate and Rhythm: Normal rate and regular rhythm.     Pulses: Normal pulses.  Pulmonary:     Effort: Pulmonary effort is normal. No respiratory distress.  Abdominal:     General: There is no distension.     Tenderness: There is no abdominal tenderness.  Musculoskeletal:        General: Normal range of motion.     Cervical back: Normal range of motion.  Skin:    General: Skin is warm and dry.  Neurological:     General: No focal deficit present.     Mental Status: She is alert and oriented to person, place, and time. Mental status is at baseline.     Comments: Full range of motion in bilateral upper and lower extremities.  EOMs intact.  No nystagmus.  Without facial droop.  Tongue midline.  Without pronator drift.  5/5 strength in bilateral upper and lower extremities in both extension and flexor muscle groups.  Sensation intact and symmetrical bilaterally.     ED Results / Procedures / Treatments   Labs (all labs ordered are listed, but only abnormal results are displayed) Labs Reviewed - No data to display  EKG None  Radiology CT Head Wo Contrast  Result Date: 05/29/2022 CLINICAL DATA:  Hit head on fire extinguisher 2 days ago with persistent headache. EXAM: CT HEAD WITHOUT CONTRAST TECHNIQUE: Contiguous axial images were obtained from the base of the skull through the vertex without intravenous contrast. RADIATION DOSE REDUCTION: This exam was performed according to the departmental dose-optimization program which includes automated exposure control, adjustment of the mA and/or kV according to patient size and/or use of iterative reconstruction  technique. COMPARISON:  None Available. FINDINGS: Brain: Ventricles, cisterns and other CSF spaces are normal. There is no mass, mass effect, shift of midline structures or acute hemorrhage. No evidence of acute infarction. Vascular: No hyperdense vessel or unexpected calcification. Skull: Normal. Negative for fracture or focal lesion. Sinuses/Orbits: No acute finding. Other: None. IMPRESSION: No acute findings. Electronically Signed   By: Marin Olp M.D.   On: 05/29/2022 14:51    Procedures Procedures    Medications Ordered in ED Medications  prochlorperazine (COMPAZINE) injection 10 mg (has no administration in time range)  LORazepam (ATIVAN) tablet 2 mg (2 mg Oral Given 05/29/22 1021)    ED Course/ Medical Decision Making/ A&P  Medical Decision Making Risk Prescription drug management.   Medical Decision Making / ED Course   This patient presents to the ED for concern of headache, this involves an extensive number of treatment options, and is a complaint that carries with it a high risk of complications and morbidity.  The differential diagnosis includes CVA, migraine headache, posttraumatic headache, concussion, tension headache, cluster headache  MDM: 72 year old female presents today for evaluation of headache following trauma that occurred Saturday where she struck her head on a fire extinguisher as she was standing from a bent position.  No loss of consciousness.  Not on anticoagulation.  On exam she is without hematoma.  CT head without acute intracranial finding.  She has been taking Tylenol with mild improvement.  Neurological exam without focal deficits.  Will give dose of Compazine and reevaluate. Patient on reevaluation reports significant improvement in headache.  Patient evaluated by attending at bedside as well.  Agrees with plan.  Patient is stable for discharge.  Believe patient may have concussion given the head trauma persistent headache.   Information for concussion clinic provided.  Strict return precautions discussed.  Patient is in agreement with plan.  She is appropriate for discharge.  Lab Tests: -I ordered, reviewed, and interpreted labs.   The pertinent results include:   Labs Reviewed - No data to display    EKG  EKG Interpretation  Date/Time:    Ventricular Rate:    PR Interval:    QRS Duration:   QT Interval:    QTC Calculation:   R Axis:     Text Interpretation:           Imaging Studies ordered: I ordered imaging studies including ct head I independently visualized and interpreted imaging. I agree with the radiologist interpretation   Medicines ordered and prescription drug management: Meds ordered this encounter  Medications   LORazepam (ATIVAN) tablet 2 mg   prochlorperazine (COMPAZINE) injection 10 mg    -I have reviewed the patients home medicines and have made adjustments as needed  Reevaluation: After the interventions noted above, I reevaluated the patient and found that they have :improved  Co morbidities that complicate the patient evaluation  Past Medical History:  Diagnosis Date   Cataract    [employer] 11/24/2019- removed both   Chronic kidney disease    [employer] 11/24/2019   Clotting disorder ([place])    PE 2010 per pt there really was not a PE   Fistula    LUE; was on dialysis for 11 months   Heart murmur    Hypertension    [employer] 11/24/2019   Kidney transplanted    transplant team [facility] [place])   Pre-diabetes    no meds   Pulmonary embolism ([place]) 2010   never symptomatic      Dispostion: Patient is appropriate for discharge.  Discharged in stable condition peer return precaution discussed.  Patient voices understanding and is in agreement with the plan.  Final Clinical Impression(s) / ED Diagnoses Final diagnoses:  Concussion without loss of consciousness, initial encounter    Rx / DC Orders ED Discharge Orders     None         Evlyn Courier,  [name] 05/29/22 1721    Lacretia Leigh, MD 05/30/22 1546

## 2022-05-29 NOTE — ED Triage Notes (Signed)
Patient BIB [facility] from home. Saturday patient was bending over and hit her head on the fire extinguisher. No LOC, nausea, visual changes. No blood thinners. This morning woke up with headache and right eye pain.

## 2022-05-29 NOTE — ED Provider Notes (Signed)
I provided a substantive portion of the care of this patient.  I personally performed the entirety of the medical decision making for this encounter.      72 year old female who is here after sustaining head trauma.  Head CT per interpretation shows no acute findings.  Neurological exam is stable.  Stable for discharge home   Lacretia Leigh, MD 05/29/22 1655

## 2022-05-29 NOTE — ED Provider Triage Note (Signed)
Emergency Medicine Provider Triage Evaluation Note  Megan Bradford , a 72 y.o. female  was evaluated in triage.  Pt complains of head trauma on Saturday that has resulted in a headache that has not gone away. Hit head on fire extinguisher after bending down. Not on BT. No N/V, confusion. C/o moderate headache that wraps around. Has just taken tylenol.  Review of Systems  Positive: headache Negative: N/V  Physical Exam  BP 112/87 (BP Location: Right Arm)   Pulse 68   Temp 98.1 F (36.7 C) (Oral)   Resp 18   SpO2 100%  Gen:   Awake, no distress   Resp:  Normal effort  MSK:   Moves extremities without difficulty  Other:  No neurodeficits  Medical Decision Making  Medically screening exam initiated at 10:06 AM.  Appropriate orders placed.  Megan Bradford was informed that the remainder of the evaluation will be completed by another provider, this initial triage assessment does not replace that evaluation, and the importance of remaining in the ED until their evaluation is complete.   Osvaldo Shipper, [place] 05/29/22 1009

## 2022-06-13 ENCOUNTER — Ambulatory Visit (INDEPENDENT_AMBULATORY_CARE_PROVIDER_SITE_OTHER): Payer: 59 | Admitting: Emergency Medicine

## 2022-06-13 ENCOUNTER — Encounter: Payer: Self-pay | Admitting: Emergency Medicine

## 2022-06-13 VITALS — BP 108/66 | HR 65 | Temp 98.4°F | Ht 60.0 in | Wt 193.4 lb

## 2022-06-13 DIAGNOSIS — E278 Other specified disorders of adrenal gland: Secondary | ICD-10-CM

## 2022-06-13 DIAGNOSIS — R7303 Prediabetes: Secondary | ICD-10-CM | POA: Diagnosis not present

## 2022-06-13 DIAGNOSIS — I10 Essential (primary) hypertension: Secondary | ICD-10-CM

## 2022-06-13 DIAGNOSIS — R159 Full incontinence of feces: Secondary | ICD-10-CM | POA: Diagnosis not present

## 2022-06-13 DIAGNOSIS — E785 Hyperlipidemia, unspecified: Secondary | ICD-10-CM

## 2022-06-13 DIAGNOSIS — N1832 Chronic kidney disease, stage 3b: Secondary | ICD-10-CM | POA: Diagnosis not present

## 2022-06-13 DIAGNOSIS — Z940 Kidney transplant status: Secondary | ICD-10-CM

## 2022-06-13 DIAGNOSIS — K5790 Diverticulosis of intestine, part unspecified, without perforation or abscess without bleeding: Secondary | ICD-10-CM

## 2022-06-13 DIAGNOSIS — K591 Functional diarrhea: Secondary | ICD-10-CM

## 2022-06-13 NOTE — Assessment & Plan Note (Signed)
Stable.  History of kidney transplant. Follows up with nephrologist on a regular basis.

## 2022-06-13 NOTE — Assessment & Plan Note (Signed)
Well-controlled hypertension. BP Readings from Last 3 Encounters:  06/13/22 108/66  05/29/22 112/80  03/29/22 116/60  Continue amlodipine 5 mg daily and lisinopril 5 mg daily

## 2022-06-13 NOTE — Assessment & Plan Note (Signed)
Stable.  Continue rosuvastatin 20 mg daily. ?

## 2022-06-13 NOTE — Assessment & Plan Note (Signed)
Chronic and affecting quality of life. Needs GI evaluation possibly colorectal surgeon Referral placed today.

## 2022-06-13 NOTE — Patient Instructions (Signed)

## 2022-06-13 NOTE — Assessment & Plan Note (Signed)
Chronic.  Kaopectate helps.

## 2022-06-13 NOTE — Progress Notes (Signed)
Megan Bradford 72 y.o.   Chief Complaint  Patient presents with   Follow-up    Patient was seen in ED, for poss concussion hit head on a fire extinguisher  Diverticulosis flare up started 3 weeks ago , gotten progressively worse      HISTORY OF PRESENT ILLNESS: This is a 72 y.o. female here for follow-up of head injury sustained 2 weeks ago.  Seen in the emergency department.  Normal CT scan of head. Asymptomatic.  No complications. Also complaining of occasional bowel incontinence episodes. Has history of diverticulosis. Kidney transplant patient with CKD and regular nephrology evaluations  HPI   Prior to Admission medications   Medication Sig Start Date End Date Taking? Authorizing Provider  acetaminophen (TYLENOL) 650 MG CR tablet Take 650 mg by mouth every 8 (eight) hours as needed for pain.   Yes [provider]  amLODipine (NORVASC) 5 MG tablet Take 5 mg by mouth daily.   Yes [provider]  Apoaequorin (PREVAGEN) 10 MG CAPS Take 10 mg by mouth daily.   Yes [provider]  aspirin EC 81 MG tablet Take 81 mg by mouth daily. Swallow whole.   Yes [provider]  benzocaine-Menthol (DERMOPLAST) 20-0.5 % AERO Apply 1 Application topically 4 (four) times daily as needed for irritation. 10/28/21  Yes Waymon Amato, MD  benzonatate (TESSALON) 200 MG capsule Take 1 capsule (200 mg total) by mouth 2 (two) times daily as needed for cough. 03/29/22  Yes Demeka Sutter, Ines Bloomer, MD  carvedilol (COREG) 12.5 MG tablet Take 12.5 mg by mouth 2 (two) times daily with a meal.   Yes [provider]  cetirizine (ZYRTEC) 10 MG tablet Take 10 mg by mouth daily.   Yes [provider]  Cholecalciferol (VITAMIN D3) 10 MCG (400 UNIT) CAPS Take 400 Units by mouth daily.   Yes [provider]  Coenzyme Q10 (CO Q10 PO) Take 1 capsule by mouth daily.   Yes [provider]  diclofenac Sodium (VOLTAREN) 1 % GEL Apply 2 g topically 4 (four)  times daily. Patient taking differently: Apply 2 g topically daily as needed (pain). 12/01/19  Yes Wieters, Hallie C, [name]  HYDROcodone bit-homatropine (HYCODAN) 5-1.5 MG/5ML syrup Take 5 mLs by mouth at bedtime as needed for cough. 03/29/22  Yes Italia Wolfert, Ines Bloomer, MD  lisinopril (ZESTRIL) 5 MG tablet Take 5 mg by mouth daily. 03/26/21  Yes [provider]  LORazepam (ATIVAN) 2 MG tablet Take 2 mg by mouth 2 (two) times daily as needed. 01/10/22  Yes [provider]  magnesium oxide (MAG-OX) 400 MG tablet Take 400 mg by mouth daily.   Yes [provider]  multivitamin-lutein (OCUVITE-LUTEIN) CAPS capsule Take 1 capsule by mouth daily.   Yes [provider]  mycophenolate (MYFORTIC) 180 MG EC tablet Take 180 mg by mouth 2 (two) times daily.   Yes [provider]  rosuvastatin (CRESTOR) 20 MG tablet Take 1 tablet (20 mg total) by mouth daily. 12/20/20  Yes [name], MD  tacrolimus (PROGRAF) 1 MG capsule TAKE 4 CAPSULES BY MOUTH IN THE MORNING. 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   Adrenal mass, left ([place]) 02/02/2022   Diverticulosis  02/02/2022   Hospital discharge follow-up 10/20/2021   Dyslipidemia 10/20/2021   Chronic diastolic CHF (congestive heart failure) ([place]) 10/14/2021   Mixed hyperlipidemia 10/14/2021   Diarrhea 10/14/2021   Chest pain 10/13/2021   Dyssynergic defecation    Incontinence of feces with fecal urgency    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/01/2020   Trigger finger, right middle finger 10/28/2020   Coronary artery disease involving native coronary artery of native heart 08/30/2020   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   Chronic kidney disease, stage  3b ([place]) 12/17/2019   History of kidney transplant 11/27/2019   Essential hypertension 11/27/2019   Prediabetes 11/27/2019   Obesity (BMI 30-39.9) 11/27/2019    Past Medical History:  Diagnosis Date   Cataract    Phreesia 11/24/2019- removed both   Chronic kidney disease    Phreesia 11/24/2019   Clotting disorder ([place])    PE 2010 per pt there really was not a PE   Fistula    LUE; was on dialysis for 11 months   Heart murmur    Hypertension    Phreesia 11/24/2019   Kidney transplanted    transplant team [facility] [place])   Pre-diabetes    no meds   Pulmonary embolism ([place]) 2010   never symptomatic    Past Surgical History:  Procedure Laterality Date   ANAL RECTAL MANOMETRY N/A 08/03/2021   Procedure: ANO RECTAL MANOMETRY;  Surgeon: Thornton Park, MD;  Location: [facility];  Service: Gastroenterology;  Laterality: N/A;   CARDIAC CATHETERIZATION     x2   CESAREAN SECTION N/A    Phreesia 11/24/2019   CHOLECYSTECTOMY N/A    Phreesia 11/24/2019   COLONOSCOPY     DILATATION & CURETTAGE/HYSTEROSCOPY WITH MYOSURE N/A 10/28/2021   Procedure: DILATATION & CURETTAGE/HYSTEROSCOPY;  Surgeon: Waymon Amato, MD;  Location: [place];  Service: Gynecology;  Laterality: N/A;   DILATION AND CURETTAGE OF UTERUS     EYE SURGERY N/A    Cataracts removed Phreesia 11/24/2019   HIP SURGERY Left 1990   Hip dysplasia cleaned out per patient   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   LAPAROSCOPIC SALPINGO OOPHERECTOMY Bilateral 10/28/2021   Procedure: LAPAROSCOPIC SALPINGO OOPHORECTOMY WITH PELVIC WASHINGS;  Surgeon: Waymon Amato, MD;  Location: [place];  Service: Gynecology;  Laterality: Bilateral;   POLYPECTOMY     HPP   TONSILLECTOMY     TRANSFORAMINAL LUMBAR INTERBODY FUSION (TLIF) WITH PEDICLE SCREW FIXATION 1 LEVEL N/A 12/01/2020   Procedure: Transiforaminal Lumbar Interbody Fusion Lumbar four--Lumbar five;  Surgeon: Vallarie Mare, MD;  Location: [place];  Service: Neurosurgery;  Laterality: N/A;   TRIGGER FINGER RELEASE Right 10/28/2020   Long finger   VULVAR LESION REMOVAL N/A 10/28/2021   Procedure: COMPLETE RESECTION OF VULVAR WARTS;  Surgeon: Waymon Amato, MD;  Location: [place];  Service: Gynecology;  Laterality: N/A;    Social History   Socioeconomic History   Marital status: Widowed    Spouse name: Not on file   Number of children: Not on file   Years of education: Not on file   Highest education level: Not on file  Occupational History   Not on file  Tobacco Use   Smoking status: Never   Smokeless tobacco: Never  Vaping Use   Vaping Use: Never used  Substance and Sexual Activity   Alcohol use: Never   Drug use: Never   Sexual activity: Not Currently    Birth control/protection: None  Other  Topics Concern   Not on file  Social History Narrative   Not on file   Social Determinants of Health   Financial Resource Strain: Low Risk  (08/25/2021)   Overall Financial Resource Strain (CARDIA)    Difficulty of Paying Living Expenses: Not hard at all  Food Insecurity: No Food Insecurity (08/25/2021)   Hunger Vital Sign    Worried About Running Out of Food in the Last Year: Never true    Ran Out of Food in the Last Year: Never true  Transportation Needs: No Transportation Needs (08/25/2021)   PRAPARE - Hydrologist (Medical): No    Lack of Transportation (Non-Medical): No  Physical Activity: Sufficiently Active (08/25/2021)   Exercise Vital Sign    Days of Exercise per Week: 5 days    Minutes of Exercise per Session: 30 min  Stress: No Stress Concern Present (08/25/2021)   [place]    Feeling of Stress : Not at all  Social Connections: Moderately Integrated (08/25/2021)   Social Connection and Isolation Panel [NHANES]    Frequency of Communication with Friends and Family: More than three times a week     Frequency of Social Gatherings with Friends and Family: More than three times a week    Attends Religious Services: More than 4 times per year    Active Member of [employer] or Organizations: Yes    Attends Archivist Meetings: More than 4 times per year    Marital Status: Widowed  Intimate Partner Violence: Not At Risk (08/25/2021)   Humiliation, Afraid, Rape, and Kick questionnaire    Fear of Current or Ex-Partner: No    Emotionally Abused: No    Physically Abused: No    Sexually Abused: No    Family History  Problem Relation Age of Onset   Diabetes Mother    Asthma Mother    Hypertension Father    Coronary artery disease Father    Colon cancer Neg Hx    Colon polyps Neg Hx    Esophageal cancer Neg Hx    Rectal cancer Neg Hx    Stomach cancer Neg Hx    Breast cancer Neg Hx    Ovarian cancer Neg Hx    Endometrial cancer Neg Hx    Pancreatic cancer Neg Hx    Prostate cancer Neg Hx      Review of Systems  Constitutional: Negative.  Negative for chills and fever.  HENT: Negative.  Negative for congestion and sore throat.   Respiratory: Negative.  Negative for cough and shortness of breath.   Cardiovascular: Negative.  Negative for chest pain and palpitations.  Gastrointestinal:  Positive for diarrhea. Negative for abdominal pain, nausea and vomiting.  Genitourinary: Negative.   Musculoskeletal: Negative.   Skin: Negative.  Negative for rash.  Neurological: Negative.  Negative for dizziness and headaches.  All other systems reviewed and are negative.  Today's Vitals   06/13/22 1031  BP: 108/66  Pulse: 65  Temp: 98.4 F (36.9 C)  TempSrc: Oral  SpO2: 99%  Weight: 193 lb 6 oz (87.7 kg)  Height: 5' (1.524 m)   Body mass index is 37.77 kg/m.   Physical Exam Vitals reviewed.  Constitutional:      Appearance: Normal appearance.  HENT:     Head: Normocephalic.     Mouth/Throat:     Mouth: Mucous membranes are moist.     Pharynx: Oropharynx is clear.  Eyes:     Extraocular Movements: Extraocular movements intact.     Pupils: Pupils are equal, round, and reactive to light.  Cardiovascular:     Rate and Rhythm: Normal rate and regular rhythm.     Pulses: Normal pulses.     Heart sounds: Normal heart sounds.  Pulmonary:     Effort: Pulmonary effort is normal.     Breath sounds: Normal breath sounds.  Abdominal:     Palpations: Abdomen is soft.     Tenderness: There is no abdominal tenderness.  Musculoskeletal:     Cervical back: No tenderness.  Lymphadenopathy:     Cervical: No cervical adenopathy.  Skin:    General: Skin is warm and dry.  Neurological:     General: No focal deficit present.     Mental Status: She is alert and oriented to person, place, and time.  Psychiatric:        Mood and Affect: Mood normal.        Behavior: Behavior normal.      ASSESSMENT & PLAN: A total of 46 minutes was spent with the patient and counseling/coordination of care regarding preparing for this visit, review of most recent office visit notes, review of multiple chronic medical conditions and their management, review of all medications, education on nutrition, need for GI evaluation, prognosis, documentation, need for follow-up.  Problem List Items Addressed This Visit       Cardiovascular and Mediastinum   Essential hypertension    Well-controlled hypertension. BP Readings from Last 3 Encounters:  06/13/22 108/66  05/29/22 112/80  03/29/22 116/60  Continue amlodipine 5 mg daily and lisinopril 5 mg daily         Digestive   Diverticulosis    Stable.  No signs of diverticulitis. Most recent colonoscopy report reviewed. Diet and nutrition discussed.      Relevant Orders   Ambulatory referral to Gastroenterology     Genitourinary   Stage 3b chronic kidney disease ([place])    Stable.  History of kidney transplant. Follows up with nephrologist on a regular basis.        Other   History of kidney transplant   Prediabetes    Rectal sphincter incontinence - Primary    Chronic and affecting quality of life. Needs GI evaluation possibly colorectal surgeon Referral placed today.      Relevant Orders   Ambulatory referral to Gastroenterology   Diarrhea    Chronic.  Kaopectate helps.      Dyslipidemia    Stable.  Continue rosuvastatin 20 mg daily.      Adrenal mass, left [facility])    Needs follow-up with endocrinology. Referral placed today.      Relevant Orders   Ambulatory referral to Endocrinology   Patient Instructions  Diverticulosis  Diverticulosis is a condition that develops when small pouches (diverticula) form in the wall of the large intestine (colon). The colon is where water is absorbed and stool (feces) is formed. The pouches form when the inside layer of the colon pushes through weak spots in the outer layers of the colon. You may have a few pouches or many of them. The pouches usually do not cause problems unless they become inflamed or infected. When this happens, the condition is called diverticulitis. What are the causes? The cause of this condition is not known. What increases the risk? The following factors may make you more likely to develop this condition: Being older than age 68. Your risk for this condition  increases with age. Diverticulosis is rare among people younger than age 80. By age 27, many people have it. Eating a low-fiber diet. Having frequent constipation. Being overweight. Not getting enough exercise. Smoking. Taking over-the-counter pain medicines, like aspirin and ibuprofen. Having a family history of diverticulosis. What are the signs or symptoms? In most people, there are no symptoms of this condition. If you do have symptoms, they may include: Bloating. Cramps in the abdomen. Constipation or diarrhea. Pain in the lower left side of the abdomen. How is this diagnosed? Because diverticulosis usually has no symptoms, it is most often diagnosed during an  exam for other colon problems. The condition may be diagnosed by: Using a flexible scope to examine the colon (colonoscopy). Taking an X-ray of the colon after dye has been put into the colon (barium enema). Having a CT scan. How is this treated? You may not need treatment for this condition. Your health care provider may recommend treatment to prevent problems. You may need treatment if you have symptoms or if you previously had diverticulitis. Treatment may include: Eating a high-fiber diet. Taking a fiber supplement. Taking a live bacteria supplement (probiotic). Taking medicine to relax your colon. Follow these instructions at home: Medicines Take over-the-counter and prescription medicines only as told by your health care provider. If told by your health care provider, take a fiber supplement or probiotic. Constipation prevention Your condition may cause constipation. To prevent or treat constipation, you may need to: Drink enough fluid to keep your urine pale yellow. Take over-the-counter or prescription medicines. Eat foods that are high in fiber, such as beans, whole grains, and fresh fruits and vegetables. Limit foods that are high in fat and processed sugars, such as fried or sweet foods.  General instructions Try not to strain when you have a bowel movement. Keep all follow-up visits as told by your health care provider. This is important. Contact a health care provider if you: Have pain in your abdomen. Have bloating. Have cramps. Have not had a bowel movement in 3 days. Get help right away if: Your pain gets worse. Your bloating becomes very bad. You have a fever or chills, and your symptoms suddenly get worse. You vomit. You have bowel movements that are bloody or black. You have bleeding from your rectum. Summary Diverticulosis is a condition that develops when small pouches (diverticula) form in the wall of the large intestine (colon). You may have a few pouches  or many of them. This condition is most often diagnosed during an exam for other colon problems. Treatment may include increasing the fiber in your diet, taking supplements, or taking medicines. This information is not intended to replace advice given to you by your health care provider. Make sure you discuss any questions you have with your health care provider. Document Revised: 11/28/2018 Document Reviewed: 11/28/2018 Elsevier Patient Education  [place], MD [facility]

## 2022-06-13 NOTE — Assessment & Plan Note (Signed)
Needs follow-up with endocrinology. Referral placed today.

## 2022-06-13 NOTE — Assessment & Plan Note (Signed)
Stable.  No signs of diverticulitis. Most recent colonoscopy report reviewed. Diet and nutrition discussed.

## 2022-06-14 ENCOUNTER — Ambulatory Visit: Payer: 59 | Admitting: Orthopaedic Surgery

## 2022-07-26 ENCOUNTER — Other Ambulatory Visit: Payer: Self-pay | Admitting: Emergency Medicine

## 2022-07-31 ENCOUNTER — Other Ambulatory Visit: Payer: Self-pay

## 2022-07-31 MED ORDER — ROSUVASTATIN CALCIUM 20 MG PO TABS: 20.0000 mg | ORAL_TABLET | Freq: Every day | ORAL | 0 refills | Status: DC

## 2022-08-17 ENCOUNTER — Encounter: Payer: Self-pay | Admitting: Emergency Medicine

## 2022-09-08 LAB — HM MAMMOGRAPHY

## 2022-09-15 ENCOUNTER — Telehealth: Payer: Self-pay | Admitting: *Deleted

## 2022-09-15 NOTE — Telephone Encounter (Signed)
Pt called for 6 month follow up with Dr. Pricilla Holm. Patient scheduled with Warner Mccreedy, NP on May 17 th at 1300.

## 2022-09-29 ENCOUNTER — Inpatient Hospital Stay: Payer: 59 | Admitting: Gynecologic Oncology

## 2022-09-29 DIAGNOSIS — D3910 Neoplasm of uncertain behavior of unspecified ovary: Secondary | ICD-10-CM

## 2022-10-06 ENCOUNTER — Inpatient Hospital Stay: Payer: 59 | Attending: Gynecologic Oncology | Admitting: Gynecologic Oncology

## 2022-10-06 ENCOUNTER — Other Ambulatory Visit: Payer: Self-pay

## 2022-10-06 ENCOUNTER — Inpatient Hospital Stay: Payer: 59

## 2022-10-06 VITALS — BP 132/75 | HR 65 | Temp 98.6°F | Resp 16 | Ht 60.24 in | Wt 190.6 lb

## 2022-10-06 DIAGNOSIS — Z940 Kidney transplant status: Secondary | ICD-10-CM | POA: Insufficient documentation

## 2022-10-06 DIAGNOSIS — D3910 Neoplasm of uncertain behavior of unspecified ovary: Secondary | ICD-10-CM

## 2022-10-06 DIAGNOSIS — R935 Abnormal findings on diagnostic imaging of other abdominal regions, including retroperitoneum: Secondary | ICD-10-CM | POA: Insufficient documentation

## 2022-10-06 DIAGNOSIS — R102 Pelvic and perineal pain: Secondary | ICD-10-CM

## 2022-10-06 NOTE — Patient Instructions (Addendum)
No concerning findings on today's exam. 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   Plan to follow up with Dr. Sallye Ober in six months and our office in one year or sooner if needed.   If you will please call the office at [phone] after you see Dr. Sallye Ober, we will be able to set up your appointment with Dr. Pricilla Holm.  Symptoms to report to your health care team include vaginal bleeding, rectal bleeding, bloating, weight loss without effort, new and persistent pain, new and  persistent fatigue, new leg swelling, new masses (i.e., bumps in your neck or groin), new and persistent cough, new and persistent nausea and vomiting, change in bowel or bladder habits, and any other concerns.   For vaginal lubrication, try using solid coconut oil in the vagina which will melt and coat the vaginal walls. Olive oil is another option.

## 2022-10-06 NOTE — Progress Notes (Unsigned)
Patient reports doing well since her last visit.  She is continue to follow with her doctors at [facility].  She reports tolerating her diet with no emesis.  Has intermittent nausea.  For the past 1 to 2 weeks she has had mid abdominal pain described as cramping sharp at times that can radiate to the vagina.  She has not had intercourse for 3 to 4 weeks.  She does report having a history of diverticulosis.  She usually has to take an antidiarrheal at least once a week to slow things down.  She does report having frequent loose stools and can have anal soreness after this.  She has a follow-up with her GI doctor at the end of the month.  Denies vaginal bleeding or discharge.  She feels like her upper airway is growing smaller and smaller.  She has a cough that is not new.  No new lower extremity edema or pain in legs reported.  CVA tenderness.

## 2022-10-12 ENCOUNTER — Ambulatory Visit: Payer: 59 | Admitting: Gastroenterology

## 2022-10-12 ENCOUNTER — Telehealth: Payer: Self-pay | Admitting: Surgery

## 2022-10-12 LAB — INHIBIN B: Inhibin B: <7 pg/mL (ref 0.0–16.9)

## 2022-10-12 NOTE — Telephone Encounter (Signed)
Called patient to review below results. Patient verbalized understanding and had no other concerns at this time.

## 2022-10-12 NOTE — Telephone Encounter (Signed)
-----   Message from Doylene Bode, NP sent at 10/12/2022  3:41 PM EDT ----- Please let the patient know her inhibin B tumor marker checked at her recent visit is normal. ----- Message ----- From: Interface, Lab In [place] Sent: 10/12/2022   3:36 PM EDT To: Doylene Bode, NP

## 2022-10-16 ENCOUNTER — Ambulatory Visit (HOSPITAL_COMMUNITY)
Admission: RE | Admit: 2022-10-16 | Discharge: 2022-10-16 | Disposition: A | Discharge status: Home / Self Care | Payer: 59 | Source: Ambulatory Visit | Attending: Gynecologic Oncology | Admitting: Gynecologic Oncology

## 2022-10-16 DIAGNOSIS — D3910 Neoplasm of uncertain behavior of unspecified ovary: Secondary | ICD-10-CM | POA: Insufficient documentation

## 2022-10-16 DIAGNOSIS — R102 Pelvic and perineal pain: Secondary | ICD-10-CM | POA: Diagnosis present

## 2022-10-20 ENCOUNTER — Other Ambulatory Visit: Payer: Self-pay | Admitting: *Deleted

## 2022-10-20 ENCOUNTER — Telehealth: Payer: Self-pay | Admitting: Surgery

## 2022-10-20 MED ORDER — ROSUVASTATIN CALCIUM 20 MG PO TABS: 20.0000 mg | ORAL_TABLET | Freq: Every day | ORAL | 0 refills | Status: DC

## 2022-10-20 NOTE — Telephone Encounter (Signed)
Called patient to see how she's doing and let her know her recent ultrasound did show a small amount of fluid in her uterus but that it is not concerning. Patient states she is still having 6/10 pelvic pain but denies bleeding or discharge. Denies constipation or diarrhea or urinary concerns. States she had a fever of 101 a few days ago but it went away after taking Tylenol and she hasn't had a fever since. Patient also states she is having a lot of unexplained bruising. She is on 81mg  Aspirin daily but no other blood thinners. She states she is planning to see her PCP the end of this month and will call to move that appointment up so she can be seen sooner. Patient had no other concerns at this time.

## 2022-10-20 NOTE — Telephone Encounter (Signed)
-----   Message from Doylene Bode, NP sent at 10/20/2022  1:40 PM EDT ----- Please reach out to the patient and see how her pelvic pain symptoms are. She had a recent ultrasound. It showed a small amount of fluid in the uterus that does not look concerning. No other findings. Any bleeding? Discharge?

## 2022-10-23 ENCOUNTER — Telehealth: Payer: Self-pay | Admitting: *Deleted

## 2022-10-23 NOTE — Telephone Encounter (Signed)
-----   Message from Doylene Bode, NP sent at 10/23/2022 11:50 AM EDT ----- Please reach out to the patient and let her know since she is still having pain, we can always order a CT scan without contrast to further evaluate if she would like to do that. ----- Message ----- From: Interface, Rad Results In Sent: 10/19/2022  10:25 PM EDT To: Doylene Bode, NP

## 2022-10-23 NOTE — Telephone Encounter (Signed)
Spoke with Ms. Lipa and she states her pain is better. Pt rates her pain 4/10 and is only taking tylenol as needed. Patient describes her pain as "achy"  and denies vaginal bleeding, discharge, cramping, appetite changes and fever and or chills. Patient also denies any urinary symptoms. Pt is having regular bowel movements and is passing gas.    Relayed message from Warner Mccreedy, NP if still having pain we can always order a CT scan without contrast to further evaluate if Pt desires.  Pt declined at this time and advised to call the office with any concerns or questions.

## 2022-11-02 ENCOUNTER — Ambulatory Visit (INDEPENDENT_AMBULATORY_CARE_PROVIDER_SITE_OTHER): Payer: 59

## 2022-11-02 VITALS — Ht 60.0 in | Wt 188.0 lb

## 2022-11-02 DIAGNOSIS — Z0000 Encounter for general adult medical examination without abnormal findings: Secondary | ICD-10-CM | POA: Diagnosis not present

## 2022-11-02 NOTE — Patient Instructions (Addendum)
Megan Bradford , Thank you for taking time to come for your Medicare Wellness Visit. I appreciate your ongoing commitment to your health goals. Please review the following plan we discussed and let me know if I can assist you in the future.   These are the goals we discussed:  Goals       Find a job (pt-stated)      To lose 50 ponds.        This is a list of the screening recommended for you and due dates:  Health Maintenance  Topic Date Due   Hepatitis C Screening  Never done   Zoster (Shingles) Vaccine (1 of 2) Never done   COVID-19 Vaccine (4 - 2023-24 season) 11/18/2022*   Flu Shot  12/14/2022   DTaP/Tdap/Td vaccine (3 - Td or Tdap) 07/12/2023   Medicare Annual Wellness Visit  11/02/2023   Mammogram  09/07/2024   Colon Cancer Screening  05/11/2026   Pneumonia Vaccine  Completed   DEXA scan (bone density measurement)  Completed   HPV Vaccine  Aged Out  *Topic was postponed. The date shown is not the original due date.    Advanced directives: Please bring a copy of your health care power of attorney and living will to the office to be added to your chart at your convenience.   Conditions/risks identified: None  Next appointment: Follow up in one year for your annual wellness visit     Preventive Care 65 Years and Older, Female Preventive care refers to lifestyle choices and visits with your health care provider that can promote health and wellness. What does preventive care include? A yearly physical exam. This is also called an annual well check. Dental exams once or twice a year. Routine eye exams. Ask your health care provider how often you should have your eyes checked. Personal lifestyle choices, including: Daily care of your teeth and gums. Regular physical activity. Eating a healthy diet. Avoiding tobacco and drug use. Limiting alcohol use. Practicing safe sex. Taking low-dose aspirin every day. Taking vitamin and mineral supplements as recommended by your  health care provider. What happens during an annual well check? The services and screenings done by your health care provider during your annual well check will depend on your age, overall health, lifestyle risk factors, and family history of disease. Counseling  Your health care provider may ask you questions about your: Alcohol use. Tobacco use. Drug use. Emotional well-being. Home and relationship well-being. Sexual activity. Eating habits. History of falls. Memory and ability to understand (cognition). Work and work Astronomer. Reproductive health. Screening  You may have the following tests or measurements: Height, weight, and BMI. Blood pressure. Lipid and cholesterol levels. These may be checked every 5 years, or more frequently if you are over 47 years old. Skin check. Lung cancer screening. You may have this screening every year starting at age 33 if you have a 30-pack-year history of smoking and currently smoke or have quit within the past 15 years. Fecal occult blood test (FOBT) of the stool. You may have this test every year starting at age 61. Flexible sigmoidoscopy or colonoscopy. You may have a sigmoidoscopy every 5 years or a colonoscopy every 10 years starting at age 64. Hepatitis C blood test. Hepatitis B blood test. Sexually transmitted disease (STD) testing. Diabetes screening. This is done by checking your blood sugar (glucose) after you have not eaten for a while (fasting). You may have this done every 1-3 years. Bone density scan.  This is done to screen for osteoporosis. You may have this done starting at age 52. Mammogram. This may be done every 1-2 years. Talk to your health care provider about how often you should have regular mammograms. Talk with your health care provider about your test results, treatment options, and if necessary, the need for more tests. Vaccines  Your health care provider may recommend certain vaccines, such as: Influenza vaccine.  This is recommended every year. Tetanus, diphtheria, and acellular pertussis (Tdap, Td) vaccine. You may need a Td booster every 10 years. Zoster vaccine. You may need this after age 33. Pneumococcal 13-valent conjugate (PCV13) vaccine. One dose is recommended after age 12. Pneumococcal polysaccharide (PPSV23) vaccine. One dose is recommended after age 21. Talk to your health care provider about which screenings and vaccines you need and how often you need them. This information is not intended to replace advice given to you by your health care provider. Make sure you discuss any questions you have with your health care provider. Document Released: 05/28/2015 Document Revised: 01/19/2016 Document Reviewed: 03/02/2015 Elsevier Interactive Patient Education  2017 [place] Prevention in the Home Falls can cause injuries. They can happen to people of all ages. There are many things you can do to make your home safe and to help prevent falls. What can I do on the outside of my home? Regularly fix the edges of walkways and driveways and fix any cracks. Remove anything that might make you trip as you walk through a door, such as a raised step or threshold. Trim any bushes or trees on the path to your home. Use bright outdoor lighting. Clear any walking paths of anything that might make someone trip, such as rocks or tools. Regularly check to see if handrails are loose or broken. Make sure that both sides of any steps have handrails. Any raised decks and porches should have guardrails on the edges. Have any leaves, snow, or ice cleared regularly. Use sand or salt on walking paths during winter. Clean up any spills in your garage right away. This includes oil or grease spills. What can I do in the bathroom? Use night lights. Install grab bars by the toilet and in the tub and shower. Do not use towel bars as grab bars. Use non-skid mats or decals in the tub or shower. If you need to sit  down in the shower, use a plastic, non-slip stool. Keep the floor dry. Clean up any water that spills on the floor as soon as it happens. Remove soap buildup in the tub or shower regularly. Attach bath mats securely with double-sided non-slip rug tape. Do not have throw rugs and other things on the floor that can make you trip. What can I do in the bedroom? Use night lights. Make sure that you have a light by your bed that is easy to reach. Do not use any sheets or blankets that are too big for your bed. They should not hang down onto the floor. Have a firm chair that has side arms. You can use this for support while you get dressed. Do not have throw rugs and other things on the floor that can make you trip. What can I do in the kitchen? Clean up any spills right away. Avoid walking on wet floors. Keep items that you use a lot in easy-to-reach places. If you need to reach something above you, use a strong step stool that has a grab bar. Keep electrical cords  out of the way. Do not use floor polish or wax that makes floors slippery. If you must use wax, use non-skid floor wax. Do not have throw rugs and other things on the floor that can make you trip. What can I do with my stairs? Do not leave any items on the stairs. Make sure that there are handrails on both sides of the stairs and use them. Fix handrails that are broken or loose. Make sure that handrails are as long as the stairways. Check any carpeting to make sure that it is firmly attached to the stairs. Fix any carpet that is loose or worn. Avoid having throw rugs at the top or bottom of the stairs. If you do have throw rugs, attach them to the floor with carpet tape. Make sure that you have a light switch at the top of the stairs and the bottom of the stairs. If you do not have them, ask someone to add them for you. What else can I do to help prevent falls? Wear shoes that: Do not have high heels. Have rubber bottoms. Are  comfortable and fit you well. Are closed at the toe. Do not wear sandals. If you use a stepladder: Make sure that it is fully opened. Do not climb a closed stepladder. Make sure that both sides of the stepladder are locked into place. Ask someone to hold it for you, if possible. Clearly mark and make sure that you can see: Any grab bars or handrails. First and last steps. Where the edge of each step is. Use tools that help you move around (mobility aids) if they are needed. These include: Canes. Walkers. Scooters. Crutches. Turn on the lights when you go into a dark area. Replace any light bulbs as soon as they burn out. Set up your furniture so you have a clear path. Avoid moving your furniture around. If any of your floors are uneven, fix them. If there are any pets around you, be aware of where they are. Review your medicines with your doctor. Some medicines can make you feel dizzy. This can increase your chance of falling. Ask your doctor what other things that you can do to help prevent falls. This information is not intended to replace advice given to you by your health care provider. Make sure you discuss any questions you have with your health care provider. Document Released: 02/25/2009 Document Revised: 10/07/2015 Document Reviewed: 06/05/2014 Elsevier Interactive Patient Education  2017 [employer].

## 2022-11-02 NOTE — Progress Notes (Signed)
Subjective:   Megan Bradford is a 72 y.o. female who presents for Medicare Annual (Subsequent) preventive examination.  Visit Complete: Virtual  I connected with  Megan Bradford on 11/02/22 by a audio enabled telemedicine application and verified that I am speaking with the correct person using two identifiers.  Patient Location: Home  Provider Location: Home Office  I discussed the limitations of evaluation and management by telemedicine. The patient expressed understanding and agreed to proceed.  Patient Medicare AWV questionnaire was completed by the patient on ; I have confirmed that all information answered by patient is correct and no changes since this date.  Review of Systems     Cardiac Risk Factors include: advanced age (>46men, >54 women);hypertension;dyslipidemia     Objective:    Today's Vitals   11/02/22 1135  Weight: 188 lb (85.3 kg)  Height: 5' (1.524 m)   Body mass index is 36.72 kg/m.     11/02/2022   11:44 AM 05/29/2022   10:00 AM 03/18/2022   12:12 AM 01/31/2022   12:46 PM 10/28/2021   11:14 AM 10/13/2021    5:00 PM 08/25/2021   10:18 AM  Advanced Directives  Does Patient Have a Medical Advance Directive? Yes No No Yes Yes No No  Type of Estate agent of [place];Living will    Healthcare Power of Attorney    Does patient want to make changes to medical advance directive?    No - Patient declined     Copy of Healthcare Power of Attorney in Chart? No - copy requested    No - copy requested    Would patient like information on creating a medical advance directive?  No - Patient declined No - Patient declined   No - Patient declined No - Patient declined    Current Medications (verified) Outpatient Encounter Medications as of 11/02/2022  Medication Sig   amLODipine (NORVASC) 5 MG tablet Take 5 mg by mouth daily.   aspirin EC 81 MG tablet Take 81 mg by mouth daily. Swallow whole.   carvedilol (COREG) 12.5 MG tablet Take 12.5 mg by  mouth 2 (two) times daily with a meal.   cetirizine (ZYRTEC) 10 MG tablet Take 10 mg by mouth daily.   Cholecalciferol (VITAMIN D3) 10 MCG (400 UNIT) CAPS Take 400 Units by mouth daily.   Coenzyme Q10 (CO Q10 PO) Take 1 capsule by mouth daily.   diclofenac Sodium (VOLTAREN) 1 % GEL Apply 2 g topically 4 (four) times daily. (Patient taking differently: Apply 2 g topically daily as needed (pain).)   lisinopril (ZESTRIL) 5 MG tablet Take 5 mg by mouth daily.   LORazepam (ATIVAN) 2 MG tablet Take 2 mg by mouth 2 (two) times daily as needed.   magnesium oxide (MAG-OX) 400 MG tablet Take 400 mg by mouth daily.   mycophenolate (MYFORTIC) 180 MG EC tablet Take 180 mg by mouth 2 (two) times daily.   rosuvastatin (CRESTOR) 20 MG tablet Take 1 tablet (20 mg total) by mouth daily.   tacrolimus (PROGRAF) 1 MG capsule TAKE 4 CAPSULE BY MOUTH EVERY MORNING AND 2 CAPSULE BY MOUTH EVERY EVENING   [DISCONTINUED] acetaminophen (TYLENOL) 650 MG CR tablet Take 650 mg by mouth every 8 (eight) hours as needed for pain.   No facility-administered encounter medications on file as of 11/02/2022.    Allergies (verified) Keflex [cephalexin]   History: Past Medical History:  Diagnosis Date   Cataract    Phreesia 11/24/2019- removed both  Chronic kidney disease    Phreesia 11/24/2019   Clotting disorder (HCC)    PE 2010 per pt there really was not a PE   Fistula    LUE; was on dialysis for 11 months   Heart murmur    Hypertension    Phreesia 11/24/2019   Kidney transplanted    transplant team [facility] [place])   Pre-diabetes    no meds   Pulmonary embolism (HCC) 2010   never symptomatic   Past Surgical History:  Procedure Laterality Date   ANAL RECTAL MANOMETRY N/A 08/03/2021   Procedure: ANO RECTAL MANOMETRY;  Surgeon: Tressia Danas, MD;  Location: [facility];  Service: Gastroenterology;  Laterality: N/A;   CARDIAC CATHETERIZATION     x2   CESAREAN SECTION N/A    Phreesia 11/24/2019    CHOLECYSTECTOMY N/A    Phreesia 11/24/2019   COLONOSCOPY     DILATATION & CURETTAGE/HYSTEROSCOPY WITH MYOSURE N/A 10/28/2021   Procedure: DILATATION & CURETTAGE/HYSTEROSCOPY;  Surgeon: Hoover Browns, MD;  Location: [facility];  Service: Gynecology;  Laterality: N/A;   DILATION AND CURETTAGE OF UTERUS     EYE SURGERY N/A    Cataracts removed Phreesia 11/24/2019   HIP SURGERY Left 1990   Hip dysplasia cleaned out per patient   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   LAPAROSCOPIC SALPINGO OOPHERECTOMY Bilateral 10/28/2021   Procedure: LAPAROSCOPIC SALPINGO OOPHORECTOMY WITH PELVIC WASHINGS;  Surgeon: Hoover Browns, MD;  Location: [facility];  Service: Gynecology;  Laterality: Bilateral;   POLYPECTOMY     HPP   TONSILLECTOMY     TRANSFORAMINAL LUMBAR INTERBODY FUSION (TLIF) WITH PEDICLE SCREW FIXATION 1 LEVEL N/A 12/01/2020   Procedure: Transiforaminal Lumbar Interbody Fusion Lumbar four--Lumbar five;  Surgeon: Bedelia Person, MD;  Location: [facility];  Service: Neurosurgery;  Laterality: N/A;   TRIGGER FINGER RELEASE Right 10/28/2020   Long finger   VULVAR LESION REMOVAL N/A 10/28/2021   Procedure: COMPLETE RESECTION OF VULVAR WARTS;  Surgeon: Hoover Browns, MD;  Location: [facility];  Service: Gynecology;  Laterality: N/A;   Family History  Problem Relation Age of Onset   Diabetes Mother    Asthma Mother    Hypertension Father    Coronary artery disease Father    Colon cancer Neg Hx    Colon polyps Neg Hx    Esophageal cancer Neg Hx    Rectal cancer Neg Hx    Stomach cancer Neg Hx    Breast cancer Neg Hx    Ovarian cancer Neg Hx    Endometrial cancer Neg Hx    Pancreatic cancer Neg Hx    Prostate cancer Neg Hx    Social History   Socioeconomic History   Marital status: Widowed    Spouse name: Not on file   Number of children: Not on file   Years of education: Not on file   Highest education level: Not on file  Occupational History   Not  on file  Tobacco Use   Smoking status: Never   Smokeless tobacco: Never  Vaping Use   Vaping Use: Never used  Substance and Sexual Activity   Alcohol use: Never   Drug use: Never   Sexual activity: Not Currently    Birth control/protection: None  Other Topics Concern   Not on file  Social History Narrative   Not on file   Social Determinants of Health   Financial Resource Strain: Low Risk  (11/02/2022)   Overall  Financial Resource Strain (CARDIA)    Difficulty of Paying Living Expenses: Not hard at all  Food Insecurity: No Food Insecurity (11/02/2022)   Hunger Vital Sign    Worried About Running Out of Food in the Last Year: Never true    Ran Out of Food in the Last Year: Never true  Transportation Needs: No Transportation Needs (11/02/2022)   PRAPARE - Administrator, Civil Service (Medical): No    Lack of Transportation (Non-Medical): No  Physical Activity: Insufficiently Active (11/02/2022)   Exercise Vital Sign    Days of Exercise per Week: 3 days    Minutes of Exercise per Session: 30 min  Stress: No Stress Concern Present (11/02/2022)   [employer] of Occupational Health - Occupational Stress Questionnaire    Feeling of Stress : Not at all  Social Connections: Moderately Integrated (11/02/2022)   Social Connection and Isolation Panel [NHANES]    Frequency of Communication with Friends and Family: More than three times a week    Frequency of Social Gatherings with Friends and Family: More than three times a week    Attends Religious Services: More than 4 times per year    Active Member of [employer] or Organizations: Yes    Attends Banker Meetings: More than 4 times per year    Marital Status: Widowed    Tobacco Counseling Counseling given: Not Answered   Clinical Intake:  Pre-visit preparation completed: No  Pain : No/denies pain     BMI - recorded: 36.72 Nutritional Risks: None Diabetes: No  How often do you need to have someone  help you when you read instructions, pamphlets, or other written materials from your doctor or pharmacy?: 1 - Never  Interpreter Needed?: No  Information entered by :: Theresa Mulligan LPN   Activities of Daily Living    11/02/2022   11:41 AM  In your present state of health, do you have any difficulty performing the following activities:  Hearing? 0  Vision? 0  Difficulty concentrating or making decisions? 0  Walking or climbing stairs? 0  Dressing or bathing? 0  Doing errands, shopping? 0  Preparing Food and eating ? N  Using the Toilet? N  In the past six months, have you accidently leaked urine? N  Do you have problems with loss of bowel control? Y  Comment Wears pads and breifs prn. Followed by medical attention  Managing your Medications? N  Managing your Finances? N  Housekeeping or managing your Housekeeping? N    Patient Care Team: Georgina Quint, MD as PCP - General (Internal Medicine) Meriam Sprague, MD as PCP - Cardiology (Cardiology)  Indicate any recent Medical Services you may have received from other than [facility] providers in the past year (date may be approximate).     Assessment:   This is a routine wellness examination for [place].  Hearing/Vision screen Hearing Screening - Comments:: Denies hearing difficulties    Dietary issues and exercise activities discussed:     Goals Addressed               This Visit's Progress     Find a job (pt-stated)         Depression Screen    11/02/2022   11:40 AM 06/13/2022   10:32 AM 03/29/2022   10:20 AM 10/20/2021    1:02 PM 09/13/2021    8:09 AM 08/25/2021   10:22 AM 03/15/2021    9:01 AM  PHQ 2/9 Scores  PHQ - 2 Score 0 0 0 1 1 0 0  PHQ- 9 Score    7       Fall Risk    11/02/2022   11:43 AM 06/13/2022   10:32 AM 03/29/2022   10:20 AM 10/20/2021    1:02 PM 09/13/2021    8:09 AM  Fall Risk   Falls in the past year? 0 0 0 0 0  Number falls in past yr: 0 0 0    Injury with Fall? 0 0 0     Risk for fall due to : No Fall Risks No Fall Risks No Fall Risks    Follow up Falls prevention discussed Falls evaluation completed Falls evaluation completed      MEDICARE RISK AT HOME:  Medicare Risk at Home - 11/02/22 1150     Any stairs in or around the home? No    If so, are there any without handrails? No    Home free of loose throw rugs in walkways, pet beds, electrical cords, etc? Yes    Adequate lighting in your home to reduce risk of falls? Yes    Life alert? No    Use of a cane, walker or w/c? No    Grab bars in the bathroom? Yes    Shower chair or bench in shower? No    Elevated toilet seat or a handicapped toilet? Yes             TIMED UP AND GO:  Was the test performed?  No    Cognitive Function:        11/02/2022   11:44 AM 08/25/2021   10:32 AM  6CIT Screen  What Year? 0 points 0 points  What month? 0 points 0 points  What time? 0 points 0 points  Count back from 20 0 points 0 points  Months in reverse 0 points 0 points  Repeat phrase 0 points 0 points  Total Score 0 points 0 points    Immunizations Immunization History  Administered Date(s) Administered   Fluad Quad(high Dose 65+) 03/01/2020, 03/15/2021, 02/01/2022   Influenza Split 03/11/2008, 03/14/2012, 02/27/2017   Influenza, Quadrivalent, Recombinant, Inj, Pf 02/13/2018   Influenza,inj,Quad PF,6+ Mos 06/01/2015, 04/14/2016   PFIZER(Purple Top)SARS-COV-2 Vaccination 07/09/2019, 07/30/2019   Pneumococcal Conjugate-13 08/17/2017, 03/01/2020   Pneumococcal Polysaccharide-23 05/16/2007, 11/01/2018, 03/27/2019   Tdap 08/24/2005, 07/11/2013   Unspecified SARS-COV-2 Vaccination 02/01/2022   Zoster, Live 03/14/2012    TDAP status: Up to date  Flu Vaccine status: Up to date  Pneumococcal vaccine status: Up to date  Covid-19 vaccine status: Completed vaccines  Qualifies for Shingles Vaccine? Yes   Zostavax completed No   Shingrix Completed?: No.    Education has been provided regarding  the importance of this vaccine. Patient has been advised to call insurance company to determine out of pocket expense if they have not yet received this vaccine. Advised may also receive vaccine at local pharmacy or Health Dept. Verbalized acceptance and understanding.  Screening Tests Health Maintenance  Topic Date Due   Hepatitis C Screening  Never done   Zoster Vaccines- Shingrix (1 of 2) Never done   COVID-19 Vaccine (4 - 2023-24 season) 11/18/2022 (Originally 03/29/2022)   INFLUENZA VACCINE  12/14/2022   DTaP/Tdap/Td (3 - Td or Tdap) 07/12/2023   Medicare Annual Wellness (AWV)  11/02/2023   MAMMOGRAM  09/07/2024   Colonoscopy  05/11/2026   Pneumonia Vaccine 34+ Years old  Completed   DEXA SCAN  Completed  HPV VACCINES  Aged Out    Health Maintenance  Health Maintenance Due  Topic Date Due   Hepatitis C Screening  Never done   Zoster Vaccines- Shingrix (1 of 2) Never done    Colorectal cancer screening: Type of screening: Colonoscopy. Completed 05/11/17. Repeat every 5 years  Mammogram status: Completed 09/08/22. Repeat every year  Bone Density status: Completed 01/31/15. Results reflect: Bone density results: OSTEOPOROSIS. Repeat every   years.  Lung Cancer Screening: (Low Dose CT Chest recommended if Age 55-80 years, 20 pack-year currently smoking OR have quit w/in 15years.) does not qualify.    Additional Screening:  Hepatitis C Screening: does qualify; Deferred  Vision Screening: Recommended annual ophthalmology exams for early detection of glaucoma and other disorders of the eye. Is the patient up to date with their annual eye exam?  Yes  Who is the provider or what is the name of the office in which the patient attends annual eye exams? [facility] If pt is not established with a provider, would they like to be referred to a provider to establish care? No .   Dental Screening: Recommended annual dental exams for proper oral hygiene    Community Resource  Referral / Chronic Care Management:  CRR required this visit?  No   CCM required this visit?  No     Plan:     I have personally reviewed and noted the following in the patient's chart:   Medical and social history Use of alcohol, tobacco or illicit drugs  Current medications and supplements including opioid prescriptions. Patient is not currently taking opioid prescriptions. Functional ability and status Nutritional status Physical activity Advanced directives List of other physicians Hospitalizations, surgeries, and ER visits in previous 12 months Vitals Screenings to include cognitive, depression, and falls Referrals and appointments  In addition, I have reviewed and discussed with patient certain preventive protocols, quality metrics, and best practice recommendations. A written personalized care plan for preventive services as well as general preventive health recommendations were provided to patient.     Tillie Rung, LPN   [id number]   After Visit Summary: (MyChart) Due to this being a telephonic visit, the after visit summary with patients personalized plan was offered to patient via MyChart   Nurse Notes:Patient due Hep-C Screening

## 2022-11-08 ENCOUNTER — Telehealth: Payer: Self-pay | Admitting: *Deleted

## 2022-11-08 NOTE — Telephone Encounter (Signed)
Thank you - let's plan to touch base with her in a few weeks and hopefully symptoms will have continued to improve or will resolve.

## 2022-11-08 NOTE — Telephone Encounter (Signed)
Spoke with Ms. Guerin on request from Warner Mccreedy, NP. In regards to her pelvic pain. Patient states, " it's not so much painful, it's more just uncomfortable now." And "whatever is going on, it's getting better"  Patient is not taking anything for the discomfort.   Patient denies fever or chills, but states two days ago had a mild low grade fever. Patient also states she is emptying her bladder well and denies any other urinary symptoms. Patient initially had some lower back pain but that is now gone.  Patient reports having daily bowel movements and is passing gas. Denies vaginal bleeding and or discharge. Patient is also keeping herself hydrated. Advised patient to call the office with any reoccurring or any new symptoms, questions or concerns. Pt thanked the office for calling.

## 2022-11-10 ENCOUNTER — Emergency Department (HOSPITAL_COMMUNITY): Payer: 59

## 2022-11-10 ENCOUNTER — Observation Stay (HOSPITAL_COMMUNITY): Payer: 59

## 2022-11-10 ENCOUNTER — Emergency Department (HOSPITAL_BASED_OUTPATIENT_CLINIC_OR_DEPARTMENT_OTHER): Payer: 59

## 2022-11-10 ENCOUNTER — Encounter (HOSPITAL_COMMUNITY): Payer: Self-pay

## 2022-11-10 ENCOUNTER — Observation Stay (HOSPITAL_COMMUNITY)
Admission: EM | Admit: 2022-11-10 | Discharge: 2022-11-12 | Disposition: A | Discharge status: Home / Self Care | Payer: 59 | Attending: Internal Medicine | Admitting: Internal Medicine

## 2022-11-10 ENCOUNTER — Other Ambulatory Visit: Payer: Self-pay

## 2022-11-10 DIAGNOSIS — R10813 Right lower quadrant abdominal tenderness: Secondary | ICD-10-CM | POA: Insufficient documentation

## 2022-11-10 DIAGNOSIS — Z86711 Personal history of pulmonary embolism: Secondary | ICD-10-CM | POA: Insufficient documentation

## 2022-11-10 DIAGNOSIS — R079 Chest pain, unspecified: Secondary | ICD-10-CM

## 2022-11-10 DIAGNOSIS — M79661 Pain in right lower leg: Secondary | ICD-10-CM

## 2022-11-10 DIAGNOSIS — Z79899 Other long term (current) drug therapy: Secondary | ICD-10-CM | POA: Insufficient documentation

## 2022-11-10 DIAGNOSIS — N1832 Chronic kidney disease, stage 3b: Secondary | ICD-10-CM | POA: Diagnosis not present

## 2022-11-10 DIAGNOSIS — Z1152 Encounter for screening for COVID-19: Secondary | ICD-10-CM | POA: Insufficient documentation

## 2022-11-10 DIAGNOSIS — R7989 Other specified abnormal findings of blood chemistry: Secondary | ICD-10-CM | POA: Diagnosis not present

## 2022-11-10 DIAGNOSIS — I129 Hypertensive chronic kidney disease with stage 1 through stage 4 chronic kidney disease, or unspecified chronic kidney disease: Secondary | ICD-10-CM | POA: Diagnosis not present

## 2022-11-10 DIAGNOSIS — R1084 Generalized abdominal pain: Secondary | ICD-10-CM

## 2022-11-10 DIAGNOSIS — J301 Allergic rhinitis due to pollen: Secondary | ICD-10-CM

## 2022-11-10 DIAGNOSIS — E785 Hyperlipidemia, unspecified: Secondary | ICD-10-CM | POA: Diagnosis present

## 2022-11-10 DIAGNOSIS — J309 Allergic rhinitis, unspecified: Secondary | ICD-10-CM | POA: Diagnosis present

## 2022-11-10 DIAGNOSIS — E782 Mixed hyperlipidemia: Secondary | ICD-10-CM | POA: Diagnosis not present

## 2022-11-10 DIAGNOSIS — R109 Unspecified abdominal pain: Secondary | ICD-10-CM | POA: Diagnosis present

## 2022-11-10 DIAGNOSIS — I10 Essential (primary) hypertension: Secondary | ICD-10-CM | POA: Diagnosis not present

## 2022-11-10 DIAGNOSIS — R55 Syncope and collapse: Principal | ICD-10-CM

## 2022-11-10 DIAGNOSIS — N179 Acute kidney failure, unspecified: Secondary | ICD-10-CM

## 2022-11-10 LAB — URINALYSIS, W/ REFLEX TO CULTURE (INFECTION SUSPECTED)
Bacteria, UA: NONE SEEN
Bilirubin Urine: NEGATIVE
Glucose, UA: NEGATIVE mg/dL
Hgb urine dipstick: NEGATIVE
Ketones, ur: NEGATIVE mg/dL
Leukocytes,Ua: NEGATIVE
Nitrite: NEGATIVE
Protein, ur: 30 mg/dL — AB
Specific Gravity, Urine: 1.015 (ref 1.005–1.030)
pH: 5 (ref 5.0–8.0)

## 2022-11-10 LAB — CBC WITH DIFFERENTIAL/PLATELET
Abs Immature Granulocytes: 0.13 10*3/uL — ABNORMAL HIGH (ref 0.00–0.07)
Basophils Absolute: 0 10*3/uL (ref 0.0–0.1)
Basophils Relative: 0 %
Eosinophils Absolute: 0 10*3/uL (ref 0.0–0.5)
Eosinophils Relative: 0 %
HCT: 37.5 % (ref 36.0–46.0)
Hemoglobin: 11.9 g/dL — ABNORMAL LOW (ref 12.0–15.0)
Immature Granulocytes: 1 %
Lymphocytes Relative: 11 %
Lymphs Abs: 1.1 10*3/uL (ref 0.7–4.0)
MCH: 28.5 pg (ref 26.0–34.0)
MCHC: 31.7 g/dL (ref 30.0–36.0)
MCV: 89.9 fL (ref 80.0–100.0)
Monocytes Absolute: 1.5 10*3/uL — ABNORMAL HIGH (ref 0.1–1.0)
Monocytes Relative: 14 %
Neutro Abs: 7.9 10*3/uL — ABNORMAL HIGH (ref 1.7–7.7)
Neutrophils Relative %: 74 %
Platelets: 207 10*3/uL (ref 150–400)
RBC: 4.17 MIL/uL (ref 3.87–5.11)
RDW: 15.2 % (ref 11.5–15.5)
WBC: 10.7 10*3/uL — ABNORMAL HIGH (ref 4.0–10.5)
nRBC: 0 % (ref 0.0–0.2)

## 2022-11-10 LAB — COMPREHENSIVE METABOLIC PANEL
ALT: 13 U/L (ref 0–44)
AST: 17 U/L (ref 15–41)
Albumin: 3.3 g/dL — ABNORMAL LOW (ref 3.5–5.0)
Alkaline Phosphatase: 53 U/L (ref 38–126)
Anion gap: 8 (ref 5–15)
BUN: 29 mg/dL — ABNORMAL HIGH (ref 8–23)
CO2: 25 mmol/L (ref 22–32)
Calcium: 9.3 mg/dL (ref 8.9–10.3)
Chloride: 107 mmol/L (ref 98–111)
Creatinine, Ser: 2 mg/dL — ABNORMAL HIGH (ref 0.44–1.00)
GFR, Estimated: 26 mL/min — ABNORMAL LOW (ref >60)
Glucose, Bld: 194 mg/dL — ABNORMAL HIGH (ref 70–99)
Potassium: 4.5 mmol/L (ref 3.5–5.1)
Sodium: 140 mmol/L (ref 135–145)
Total Bilirubin: 0.9 mg/dL (ref 0.3–1.2)
Total Protein: 6.4 g/dL — ABNORMAL LOW (ref 6.5–8.1)

## 2022-11-10 LAB — LACTIC ACID, PLASMA
Lactic Acid, Venous: 1.4 mmol/L (ref 0.5–1.9)
Lactic Acid, Venous: 1 mmol/L (ref 0.5–1.9)

## 2022-11-10 LAB — D-DIMER, QUANTITATIVE: D-Dimer, Quant: 2.57 ug/mL-FEU — ABNORMAL HIGH (ref 0.00–0.50)

## 2022-11-10 LAB — MAGNESIUM: Magnesium: 1.6 mg/dL — ABNORMAL LOW (ref 1.7–2.4)

## 2022-11-10 LAB — PROTIME-INR
INR: 1.2 (ref 0.8–1.2)
Prothrombin Time: 15 seconds (ref 11.4–15.2)

## 2022-11-10 LAB — SARS CORONAVIRUS 2 BY RT PCR: SARS Coronavirus 2 by RT PCR: NEGATIVE

## 2022-11-10 LAB — TROPONIN I (HIGH SENSITIVITY)
Troponin I (High Sensitivity): 5 ng/L (ref <18)
Troponin I (High Sensitivity): 5 ng/L (ref <18)

## 2022-11-10 LAB — APTT: aPTT: 22 seconds — ABNORMAL LOW (ref 24–36)

## 2022-11-10 MED ORDER — LORAZEPAM 2 MG/ML IJ SOLN
1.0000 mg | Freq: Once | INTRAMUSCULAR | Status: AC | PRN
  Administered 2022-11-10: 1 mg via INTRAVENOUS
  Filled 2022-11-10: qty 1

## 2022-11-10 MED ORDER — MELATONIN 3 MG PO TABS: 3.0000 mg | ORAL_TABLET | Freq: Every evening | ORAL | Status: DC | PRN

## 2022-11-10 MED ORDER — NALOXONE HCL 0.4 MG/ML IJ SOLN: 0.4000 mg | INTRAMUSCULAR | Status: DC | PRN

## 2022-11-10 MED ORDER — FENTANYL CITRATE PF 50 MCG/ML IJ SOSY
25.0000 ug | PREFILLED_SYRINGE | INTRAMUSCULAR | Status: DC | PRN
  Administered 2022-11-10: 25 ug via INTRAVENOUS
  Filled 2022-11-10 (×2): qty 1

## 2022-11-10 MED ORDER — HEPARIN (PORCINE) 25000 UT/250ML-% IV SOLN
1050.0000 [IU]/h | INTRAVENOUS | Status: DC
  Administered 2022-11-10: 1050 [IU]/h via INTRAVENOUS
  Filled 2022-11-10: qty 250

## 2022-11-10 MED ORDER — ONDANSETRON HCL 4 MG/2ML IJ SOLN: 4.0000 mg | Freq: Four times a day (QID) | INTRAMUSCULAR | Status: DC | PRN

## 2022-11-10 MED ORDER — SODIUM CHLORIDE 0.9 % IV SOLN: INTRAVENOUS | Status: DC

## 2022-11-10 MED ORDER — HEPARIN BOLUS VIA INFUSION
1900.0000 [IU] | Freq: Once | INTRAVENOUS | Status: AC
  Administered 2022-11-10: 1900 [IU] via INTRAVENOUS
  Filled 2022-11-10: qty 1900

## 2022-11-10 NOTE — Progress Notes (Signed)
ANTICOAGULATION CONSULT NOTE - Initial Consult  Pharmacy Consult for Heparin Indication: possible PE  Allergies  Allergen Reactions   Keflex [Cephalexin] Anaphylaxis    Patient Measurements: Height: 5' (152.4 cm) Weight: 80.7 kg (178 lb) IBW/kg (Calculated) : 45.5 Heparin Dosing Weight: 64 kg  Vital Signs:    Labs: Recent Labs    11/10/22 1801  HGB 11.9*  HCT 37.5  PLT 207  CREATININE 2.00*  TROPONINIHS 5    Estimated Creatinine Clearance: 24.6 mL/min (A) (by C-G formula based on SCr of 2 mg/dL (H)).   Medical History: Past Medical History:  Diagnosis Date   Cataract    Phreesia 11/24/2019- removed both   Chronic kidney disease    Phreesia 11/24/2019   Clotting disorder (HCC)    PE 2010 per pt there really was not a PE   Fistula    LUE; was on dialysis for 11 months   Heart murmur    Hypertension    Phreesia 11/24/2019   Kidney transplanted    transplant team [facility], [place])   Pre-diabetes    no meds   Pulmonary embolism (HCC) 2010   never symptomatic    Medications:  Scheduled:  Infusions:   sodium chloride      Assessment: 39 yoF presents to ED with syncope.  Pharmacy is consulted to dose Heparin for possible PE pending VQ scan.  PMH is significant for renal transplant (2016) with baseline SCr 1.6-1.8, and PE (2010). No prior to admit anticoagulants.  Baseline coags ip [place] LE: negative for DVT CBC: Hgb decreased to 11.9, Plt 207 SCr 2, above baseline  Goal of Therapy:  Heparin level 0.3-0.7 units/ml Monitor platelets by anticoagulation protocol: Yes   Plan:  Give heparin 1900 units bolus IV x 1 Start heparin IV infusion at 1050 units/hr Heparin level 8 hours after starting Daily heparin level and CBC  Lynann Beaver PharmD, BCPS [facility] [phone] 11/10/2022 7:28 PM

## 2022-11-10 NOTE — Progress Notes (Signed)
RLE venous duplex has been completed.  Preliminary results given to Dr. Anitra Lauth.    Results can be found under chart review under CV PROC. 11/10/2022 7:16 PM Dontell Mian RVT, RDMS

## 2022-11-10 NOTE — ED Triage Notes (Signed)
[place] bus [place] w/ c/o syncopal episode. Pt had N/V, dizziness, been outside for about 1-2 mins before episode. Pt vomited x2, once right before passing out and second right after waking up. 250cc NS given en route. LOC, pt did not hit her head, witnessed fall, lowered to the ground by daughter.  Kidney transplant 8 years ago and reporting pain in that same area. 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

## 2022-11-10 NOTE — H&P (Signed)
History and Physical      Megan Bradford ZOX:[id number] DOB: [birth year] DOA: 11/10/2022; DOS: 11/10/2022  PCP: Georgina Quint, MD *** Patient coming from: home ***  I have personally briefly reviewed patient's old medical records in [facility]  Chief Complaint: ***  HPI: Megan Bradford is a 71 y.o. female with medical history significant for *** who is admitted to [facility] on 11/10/2022 with *** after presenting from home*** to [facility] ED complaining of ***.   ***        ***  ED Course:  Vital signs in the ED were notable for the following: ***  Labs were notable for the following: ***  Per my interpretation, EKG in ED demonstrated the following:  ***  Imaging and additional notable ED work-up: ***  While in the ED, the following were administered: ***  Subsequently, the patient was admitted  ***  ***red   Review of Systems: As per HPI otherwise 10 point review of systems negative.   Past Medical History:  Diagnosis Date   Cataract    Phreesia 11/24/2019- removed both   Chronic kidney disease    Phreesia 11/24/2019   Clotting disorder (HCC)    PE 2010 per pt there really was not a PE   Fistula    LUE; was on dialysis for 11 months   Heart murmur    Hypertension    Phreesia 11/24/2019   Kidney transplanted    transplant team [facility], [place])   Pre-diabetes    no meds   Pulmonary embolism (HCC) 2010   never symptomatic    Past Surgical History:  Procedure Laterality Date   ANAL RECTAL MANOMETRY N/A 08/03/2021   Procedure: ANO RECTAL MANOMETRY;  Surgeon: Tressia Danas, MD;  Location: [facility];  Service: Gastroenterology;  Laterality: N/A;   CARDIAC CATHETERIZATION     x2   CESAREAN SECTION N/A    Phreesia 11/24/2019   CHOLECYSTECTOMY N/A    Phreesia 11/24/2019   COLONOSCOPY     DILATATION & CURETTAGE/HYSTEROSCOPY WITH MYOSURE N/A 10/28/2021   Procedure: DILATATION & CURETTAGE/HYSTEROSCOPY;  Surgeon: Hoover Browns, MD;   Location: [facility];  Service: Gynecology;  Laterality: N/A;   DILATION AND CURETTAGE OF UTERUS     EYE SURGERY N/A    Cataracts removed Phreesia 11/24/2019   HIP SURGERY Left 1990   Hip dysplasia cleaned out per patient   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   LAPAROSCOPIC SALPINGO OOPHERECTOMY Bilateral 10/28/2021   Procedure: LAPAROSCOPIC SALPINGO OOPHORECTOMY WITH PELVIC WASHINGS;  Surgeon: Hoover Browns, MD;  Location: [facility];  Service: Gynecology;  Laterality: Bilateral;   POLYPECTOMY     HPP   TONSILLECTOMY     TRANSFORAMINAL LUMBAR INTERBODY FUSION (TLIF) WITH PEDICLE SCREW FIXATION 1 LEVEL N/A 12/01/2020   Procedure: Transiforaminal Lumbar Interbody Fusion Lumbar four--Lumbar five;  Surgeon: Bedelia Person, MD;  Location: [facility] OR;  Service: Neurosurgery;  Laterality: N/A;   TRIGGER FINGER RELEASE Right 10/28/2020   Long finger   VULVAR LESION REMOVAL N/A 10/28/2021   Procedure: COMPLETE RESECTION OF VULVAR WARTS;  Surgeon: Hoover Browns, MD;  Location: [facility];  Service: Gynecology;  Laterality: N/A;    Social History:  reports that she has never smoked. She has never used smokeless tobacco. She reports that she does not drink alcohol and does not use drugs.   Allergies  Allergen Reactions   Keflex [Cephalexin] Anaphylaxis    Family History  Problem Relation Age of Onset   Diabetes Mother    Asthma Mother    Hypertension Father    Coronary artery disease Father    Colon cancer Neg Hx    Colon polyps Neg Hx    Esophageal cancer Neg Hx    Rectal cancer Neg Hx    Stomach cancer Neg Hx    Breast cancer Neg Hx    Ovarian cancer Neg Hx    Endometrial cancer Neg Hx    Pancreatic cancer Neg Hx    Prostate cancer Neg Hx     Family history reviewed and not pertinent ***   Prior to Admission medications   Medication Sig Start Date End Date Taking? Authorizing Provider  amLODipine (NORVASC) 5 MG tablet Take 5 mg by mouth  daily.    [provider]  aspirin EC 81 MG tablet Take 81 mg by mouth daily. Swallow whole.    [provider]  carvedilol (COREG) 12.5 MG tablet Take 12.5 mg by mouth 2 (two) times daily with a meal.    [provider]  cetirizine (ZYRTEC) 10 MG tablet Take 10 mg by mouth daily.    [provider]  Cholecalciferol (VITAMIN D3) 10 MCG (400 UNIT) CAPS Take 400 Units by mouth daily.    [provider]  Coenzyme Q10 (CO Q10 PO) Take 1 capsule by mouth daily.    [provider]  diclofenac Sodium (VOLTAREN) 1 % GEL Apply 2 g topically 4 (four) times daily. Patient taking differently: Apply 2 g topically daily as needed (pain). 12/01/19   Wieters, Hallie C, [name]  lisinopril (ZESTRIL) 5 MG tablet Take 5 mg by mouth daily. 03/26/21   [provider]  LORazepam (ATIVAN) 2 MG tablet Take 2 mg by mouth 2 (two) times daily as needed. 01/10/22   [provider]  magnesium oxide (MAG-OX) 400 MG tablet Take 400 mg by mouth daily.    [provider]  mycophenolate (MYFORTIC) 180 MG EC tablet Take 180 mg by mouth 2 (two) times daily.    [provider]  rosuvastatin (CRESTOR) 20 MG tablet Take 1 tablet (20 mg total) by mouth daily. 10/20/22   Meriam Sprague, MD  tacrolimus (PROGRAF) 1 MG capsule TAKE 4 CAPSULE BY MOUTH EVERY MORNING AND 2 CAPSULE BY MOUTH EVERY EVENING 07/26/22   Georgina Quint, MD     Objective    Physical Exam: Vitals:   11/10/22 1802 11/10/22 1830 11/10/22 1930 11/10/22 1953  BP:  134/83    Pulse:  82 81   Resp:  (!) 24    Temp:    97.8 F (36.6 C)  TempSrc:    Oral  SpO2:  100% 100%   Weight: 80.7 kg     Height: 5' (1.524 m)       General: appears to be stated age; alert, oriented Skin: warm, dry, no rash Head:  AT/[place] Mouth:  Oral mucosa membranes appear moist, normal dentition Neck: supple; trachea midline Heart:  RRR; did not appreciate any M/R/G Lungs: CTAB, did not  appreciate any wheezes, rales, or rhonchi Abdomen: + BS; soft, ND, NT Vascular: 2+ pedal pulses b/l; 2+ radial pulses b/l Extremities: no peripheral edema, no muscle wasting Neuro: strength and sensation intact in upper and lower extremities b/l    *** Neuro: 5/5 strength of the proximal and distal flexors and extensors of the upper and lower extremities bilaterally; sensation intact in upper and lower extremities b/l; cranial nerves  II through XII grossly intact; no pronator drift; no evidence suggestive of slurred speech, dysarthria, or facial droop; Normal muscle tone. No tremors. *** Neuro: In the setting of the patient's current mental status and associated inability to follow instructions, unable to perform full neurologic exam at this time.  As such, assessment of strength, sensation, and cranial nerves is limited at this time. Patient noted to spontaneously move all 4 extremities. No tremors.  ***    Labs on Admission: I have personally reviewed following labs and imaging studies  CBC: Recent Labs  Lab 11/10/22 1801  WBC 10.7*  NEUTROABS 7.9*  HGB 11.9*  HCT 37.5  MCV 89.9  PLT 207   Basic Metabolic Panel: Recent Labs  Lab 11/10/22 1801  NA 140  K 4.5  CL 107  CO2 25  GLUCOSE 194*  BUN 29*  CREATININE 2.00*  CALCIUM 9.3   GFR: Estimated Creatinine Clearance: 24.6 mL/min (A) (by C-G formula based on SCr of 2 mg/dL (H)). Liver Function Tests: Recent Labs  Lab 11/10/22 1801  AST 17  ALT 13  ALKPHOS 53  BILITOT 0.9  PROT 6.4*  ALBUMIN 3.3*   No results for input(s): "LIPASE", "AMYLASE" in the last 168 hours. No results for input(s): "AMMONIA" in the last 168 hours. Coagulation Profile: Recent Labs  Lab 11/10/22 1942  INR 1.2   Cardiac Enzymes: No results for input(s): "CKTOTAL", "CKMB", "CKMBINDEX", "TROPONINI" in the last 168 hours. BNP (last 3 results) No results for input(s): "PROBNP" in the last 8760 hours. HbA1C: No results for input(s):  "HGBA1C" in the last 72 hours. CBG: No results for input(s): "GLUCAP" in the last 168 hours. Lipid Profile: No results for input(s): "CHOL", "HDL", "LDLCALC", "TRIG", "CHOLHDL", "LDLDIRECT" in the last 72 hours. Thyroid Function Tests: No results for input(s): "TSH", "T4TOTAL", "FREET4", "T3FREE", "THYROIDAB" in the last 72 hours. Anemia Panel: No results for input(s): "VITAMINB12", "FOLATE", "FERRITIN", "TIBC", "IRON", "RETICCTPCT" in the last 72 hours. Urine analysis: No results found for: "COLORURINE", "APPEARANCEUR", "LABSPEC", "PHURINE", "GLUCOSEU", "HGBUR", "BILIRUBINUR", "KETONESUR", "PROTEINUR", "UROBILINOGEN", "NITRITE", "LEUKOCYTESUR"  Radiological Exams on Admission: VAS [place] LOWER EXTREMITY VENOUS (DVT) (7a-7p)  Result Date: 11/10/2022  Lower Venous DVT Study Patient Name:  LADIAMOND VEITH  Date of Exam:   11/10/2022 Medical Rec #: [id number]        Accession #:    [id number] Date of Birth: [birth year]        Patient Gender: F Patient Age:   2 years Exam Location:  [facility] Procedure:      VAS [place] LOWER EXTREMITY VENOUS (DVT) Referring Phys: Gwyneth Sprout --------------------------------------------------------------------------------  Indications: Pain.  Risk Factors: HX of PE per patient. Limitations: Poor ultrasound/tissue interface. Comparison Study: Previous RLEV exam on 08/27/2015 was negative for DVT Performing Technologist: Ernestene Mention RVT, RDMS  Examination Guidelines: A complete evaluation includes B-mode imaging, spectral Doppler, color Doppler, and power Doppler as needed of all accessible portions of each vessel. Bilateral testing is considered an integral part of a complete examination. Limited examinations for reoccurring indications may be performed as noted. The reflux portion of the exam is performed with the patient in reverse Trendelenburg.  +---------+---------------+---------+-----------+----------+-------------------+ RIGHT     CompressibilityPhasicitySpontaneityPropertiesThrombus Aging      +---------+---------------+---------+-----------+----------+-------------------+ CFV      Full           Yes      Yes                                      +---------+---------------+---------+-----------+----------+-------------------+  SFJ      Full                                                             +---------+---------------+---------+-----------+----------+-------------------+ FV Prox  Full           Yes      Yes                                      +---------+---------------+---------+-----------+----------+-------------------+ FV Mid   Full           Yes      Yes                                      +---------+---------------+---------+-----------+----------+-------------------+ FV DistalFull           Yes      Yes                                      +---------+---------------+---------+-----------+----------+-------------------+ PFV      Full                                                             +---------+---------------+---------+-----------+----------+-------------------+ POP      Full           Yes      Yes                                      +---------+---------------+---------+-----------+----------+-------------------+ PTV      Full                                                             +---------+---------------+---------+-----------+----------+-------------------+ PERO                                                  Not well visualized +---------+---------------+---------+-----------+----------+-------------------+   +----+---------------+---------+-----------+----------+--------------+ LEFTCompressibilityPhasicitySpontaneityPropertiesThrombus Aging +----+---------------+---------+-----------+----------+--------------+ CFV Full           Yes      Yes                                  +----+---------------+---------+-----------+----------+--------------+    Summary: RIGHT: - There is no evidence of deep vein thrombosis in the lower extremity.  - A large cystic structure is found in the popliteal fossa (6.1 x 1.2 x 2.7 cm).  LEFT: - No evidence of common femoral vein  obstruction.  *See table(s) above for measurements and observations.    Preliminary    DG Chest Port 1 View  Result Date: 11/10/2022 CLINICAL DATA:  Syncope and chest pain EXAM: PORTABLE CHEST 1 VIEW COMPARISON:  X-ray 03/18/2022 FINDINGS: Calcified and tortuous aorta. Normal cardiopericardial silhouette. No consolidation, pneumothorax or effusion. No edema. Overlapping cardiac leads. Degenerative changes seen of the spine. IMPRESSION: No acute cardiopulmonary disease Electronically Signed   By: Karen Kays M.D.   On: 11/10/2022 17:43      Assessment/Plan    Principal Problem:   Syncope  ***      ***          ***               ***              ***              ***              ***              ***              ***              ***              ***              ***              ***              ***     DVT prophylaxis: SCD's ***  Code Status: Full code*** Family Communication: none*** Disposition Plan: Per Rounding Team Consults called: none***;  Admission status: ***    I SPENT GREATER THAN 75 *** MINUTES IN CLINICAL CARE TIME/MEDICAL DECISION-MAKING IN COMPLETING THIS ADMISSION.     Chaney Born Aneshia Jacquet DO [facility] From 7PM - 7AM   11/10/2022, 8:14 PM   ***

## 2022-11-10 NOTE — ED Provider Notes (Signed)
[facility] AT [facility] Provider Note   CSN: [id number] Arrival date & time: 11/10/22  1534     History  Chief Complaint  Patient presents with   Syncope    Megan Bradford is a 72 y.o. female.  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  Patient reports for the last 2 days she has not felt herself.  She has noticed some pain with deep breathing, cough with some sputum production and low-grade temperature of 100.  She has had nausea but denies any vomiting prior to today and does report some diarrhea but reports that its fairly common for her.  She does not have any dysuria frequency or urgency but states today she started noticing some pain in her right lower abdomen over her transplant site.  She has pain in the back of her lungs but denies any flank pain.  Today she had gone out with her granddaughter and they had gone to [employer] and then went to [employer].  When walking back to the bus stop she was standing waiting for the bus outside but only had been outside for a few minutes when she reports she started to feel dizzy like she might pass out.  Her granddaughter reports she was leaning over her cart and she finally said she needed to lay down.  When her granddaughter laid her down she finally became unresponsive for a minute or less.  She had 1 episode of emesis prior to syncope and then 1 episode after she woke up.  There was no report of any hypotension.  Patient denies any dizziness currently.  She reports she has had some shortness of breath.  Also she does admit that approximately a week and a half ago she was having pain in her right leg to the point it was difficult to  walk but she did not seek care.  The history is provided by the patient, medical records and a relative.       Home Medications Prior to Admission medications   Medication Sig Start Date End Date Taking? Authorizing Provider  amLODipine (NORVASC) 5 MG tablet Take 5 mg by mouth daily.    [provider]  aspirin EC 81 MG tablet Take 81 mg by mouth daily. Swallow whole.    [provider]  carvedilol (COREG) 12.5 MG tablet Take 12.5 mg by mouth 2 (two) times daily with a meal.    [provider]  cetirizine (ZYRTEC) 10 MG tablet Take 10 mg by mouth daily.    [provider]  Cholecalciferol (VITAMIN D3) 10 MCG (400 UNIT) CAPS Take 400 Units by mouth daily.    [provider]  Coenzyme Q10 (CO Q10 PO) Take 1 capsule by mouth daily.    [provider]  diclofenac Sodium (VOLTAREN) 1 % GEL Apply 2 g topically 4 (four) times daily. Patient taking differently: Apply 2 g topically daily as needed (pain). 12/01/19   Wieters, Hallie C, [name]  lisinopril (ZESTRIL) 5 MG tablet Take 5 mg by mouth daily. 03/26/21   [provider]  LORazepam (ATIVAN) 2 MG tablet Take 2 mg by mouth 2 (two) times daily as needed. 01/10/22   [provider]  magnesium oxide (MAG-OX) 400 MG tablet Take 400 mg by mouth daily.    [provider]  mycophenolate (MYFORTIC) 180 MG EC tablet Take 180 mg by mouth 2 (two) times daily.    [provider]  rosuvastatin (CRESTOR) 20 MG tablet Take 1 tablet (20 mg total) by mouth daily. 10/20/22   Meriam Sprague, MD  tacrolimus (PROGRAF) 1 MG capsule TAKE 4 CAPSULE BY MOUTH EVERY MORNING AND 2 CAPSULE BY MOUTH EVERY EVENING 07/26/22   Georgina Quint, MD      Allergies    Keflex [cephalexin]    Review of Systems   Review of Systems  Physical Exam Updated Vital Signs Ht 5' (1.524 m)   Wt 80.7 kg   BMI 34.76 kg/m  Physical Exam Vitals and nursing note reviewed.  Constitutional:       General: She is not in acute distress.    Appearance: She is well-developed.  HENT:     Head: Normocephalic and atraumatic.     Mouth/Throat:     Mouth: Mucous membranes are dry.  Eyes:     Pupils: Pupils are equal, round, and reactive to light.  Cardiovascular:     Rate and Rhythm: Normal rate and regular rhythm.     Heart sounds: Normal heart sounds. No murmur heard.    No friction rub.  Pulmonary:     Effort: Pulmonary effort is normal.     Breath sounds: Normal breath sounds. No wheezing or rales.  Abdominal:     General: Bowel sounds are normal. There is no distension.     Palpations: Abdomen is soft.     Tenderness: There is abdominal tenderness. There is no right CVA tenderness, left CVA tenderness, guarding or rebound.     Comments: Mild tenderness in the right lower quadrant but no guarding or rebound  Musculoskeletal:        General: No tenderness. Normal range of motion.     Right lower leg: No edema.     Left lower leg: No edema.     Comments: No edema  Skin:    General: Skin is warm and dry.     Findings: No rash.  Neurological:     Mental Status: She is alert and oriented to person, place, and time. Mental status is at baseline.     Cranial Nerves: No cranial nerve deficit.  Psychiatric:        Behavior: Behavior normal.     ED Results / Procedures / Treatments   Labs (all labs ordered are listed, but only abnormal results are displayed) Labs Reviewed  CBC WITH DIFFERENTIAL/PLATELET - Abnormal; Notable for the following components:      Result Value   WBC 10.7 (*)    Hemoglobin 11.9 (*)    Neutro Abs 7.9 (*)    Monocytes Absolute 1.5 (*)    Abs Immature Granulocytes 0.13 (*)    All other components within normal limits  COMPREHENSIVE METABOLIC PANEL - Abnormal; Notable for the following components:   Glucose, Bld 194 (*)    BUN 29 (*)    Creatinine, Ser 2.00 (*)    Total Protein 6.4 (*)    Albumin 3.3 (*)    GFR, Estimated 26 (*)    All other  components within normal limits  D-DIMER, QUANTITATIVE - Abnormal; Notable for the following components:   D-Dimer, Quant 2.57 (*)    All other components within normal limits  SARS CORONAVIRUS 2 BY  RT PCR  LACTIC ACID, PLASMA  URINALYSIS, W/ REFLEX TO CULTURE (INFECTION SUSPECTED)  LACTIC ACID, PLASMA  TROPONIN I (HIGH SENSITIVITY)  TROPONIN I (HIGH SENSITIVITY)    EKG EKG Interpretation Date/Time:  Friday November 10 2022 15:50:47 EDT Ventricular Rate:  82 PR Interval:  158 QRS Duration:  89 QT Interval:  376 QTC Calculation: 440 R Axis:   77  Text Interpretation: Sinus rhythm S1,S2,S3 pattern No significant change since last tracing Confirmed by Gwyneth Sprout ([id number]) on 11/10/2022 4:11:10 PM  Radiology VAS [place] LOWER EXTREMITY VENOUS (DVT) (7a-7p)  Result Date: 11/10/2022  Lower Venous DVT Study Patient Name:  TACORI WILLHOITE  Date of Exam:   11/10/2022 Medical Rec #: [id number]        Accession #:    [id number] Date of Birth: Oct 18, 1950        Patient Gender: F Patient Age:   29 years Exam Location:  [facility] Procedure:      VAS [place] LOWER EXTREMITY VENOUS (DVT) Referring Phys: Gwyneth Sprout --------------------------------------------------------------------------------  Indications: Pain.  Risk Factors: HX of PE per patient. Limitations: Poor ultrasound/tissue interface. Comparison Study: Previous RLEV exam on 08/27/2015 was negative for DVT Performing Technologist: Ernestene Mention RVT, RDMS  Examination Guidelines: A complete evaluation includes B-mode imaging, spectral Doppler, color Doppler, and power Doppler as needed of all accessible portions of each vessel. Bilateral testing is considered an integral part of a complete examination. Limited examinations for reoccurring indications may be performed as noted. The reflux portion of the exam is performed with the patient in reverse Trendelenburg.  +---------+---------------+---------+-----------+----------+-------------------+  RIGHT    CompressibilityPhasicitySpontaneityPropertiesThrombus Aging      +---------+---------------+---------+-----------+----------+-------------------+ CFV      Full           Yes      Yes                                      +---------+---------------+---------+-----------+----------+-------------------+ SFJ      Full                                                             +---------+---------------+---------+-----------+----------+-------------------+ FV Prox  Full           Yes      Yes                                      +---------+---------------+---------+-----------+----------+-------------------+ FV Mid   Full           Yes      Yes                                      +---------+---------------+---------+-----------+----------+-------------------+ FV DistalFull           Yes      Yes                                      +---------+---------------+---------+-----------+----------+-------------------+ PFV      Full                                                             +---------+---------------+---------+-----------+----------+-------------------+  POP      Full           Yes      Yes                                      +---------+---------------+---------+-----------+----------+-------------------+ PTV      Full                                                             +---------+---------------+---------+-----------+----------+-------------------+ PERO                                                  Not well visualized +---------+---------------+---------+-----------+----------+-------------------+   +----+---------------+---------+-----------+----------+--------------+ LEFTCompressibilityPhasicitySpontaneityPropertiesThrombus Aging +----+---------------+---------+-----------+----------+--------------+ CFV Full           Yes      Yes                                  +----+---------------+---------+-----------+----------+--------------+    Summary: RIGHT: - There is no evidence of deep vein thrombosis in the lower extremity.  - A large cystic structure is found in the popliteal fossa (6.1 x 1.2 x 2.7 cm).  LEFT: - No evidence of common femoral vein obstruction.  *See table(s) above for measurements and observations.    Preliminary    DG Chest Port 1 View  Result Date: 11/10/2022 CLINICAL DATA:  Syncope and chest pain EXAM: PORTABLE CHEST 1 VIEW COMPARISON:  X-ray 03/18/2022 FINDINGS: Calcified and tortuous aorta. Normal cardiopericardial silhouette. No consolidation, pneumothorax or effusion. No edema. Overlapping cardiac leads. Degenerative changes seen of the spine. IMPRESSION: No acute cardiopulmonary disease Electronically Signed   By: Karen Kays M.D.   On: 11/10/2022 17:43    Procedures Procedures    Medications Ordered in ED Medications  0.9 %  sodium chloride infusion (has no administration in time range)    ED Course/ Medical Decision Making/ A&P                             Medical Decision Making Amount and/or Complexity of Data Reviewed External Data Reviewed: notes. Labs: ordered. Decision-making details documented in ED Course. Radiology: ordered and independent interpretation performed. Decision-making details documented in ED Course. ECG/medicine tests: ordered and independent interpretation performed. Decision-making details documented in ED Course.  Risk Parenteral controlled substances. Decision regarding hospitalization.   Pt with multiple medical problems and comorbidities and presenting today with a complaint that caries a high risk for morbidity and mortality.  Here today with multiple issues going on.  Concern for possible infectious etiology such as pneumonia or viral etiology with new cough, pain with breathing and low-grade temperatures of 100 especially in the setting of her being immune compromised that she is compliant  with her rejection medication.  Also concern for possible PE with a low-grade fever and pleuritic chest pain in the setting of having PE in the past.  Also with some leg pain a week and a half ago.  Concern for  AKI or rejection versus UTI.  Also patient has stage I cancer in the pelvis that she reports is being monitored also possibility for complication of that.  Patient is not hypotensive and appears to be at her baseline currently.  I independently interpreted patient's EKG which shows no acute findings today.  Low suspicion for ACS.  Will keep patient on continuous cardiac monitoring to evaluate for any dysrhythmias.  At this time patient is in normal sinus rhythm.  7:27 PM I independently interpreted patient's labs.  CBC with mild leukocytosis of 10 but stable hemoglobin, CMP today with mild AKI with creatinine of 2 from 1.7-1.8 which is her baseline, troponin is negative, lactic acid within normal limits, COVID is negative, D-dimer elevated today at 2.57.  I have independently visualized and interpreted pt's images today.  Lower extremity duplex is negative for DVT.  And chest x-ray without acute findings.  Concerned that patient could have PE given her prior history, now cancer and she cannot get a CTA due to her renal transplant.  Will start patient on heparin and admit that she can get a VQ scan tomorrow.  Findings discussed with the patient and her granddaughter.  They are comfortable with this plan.  Consult to the hospitalist for admission.  Pharmacy consulted for heparin admission.  Patient has no risk factors for bleeding.  CRITICAL CARE Performed by: Chelsey Redondo Total critical care time: 30 minutes Critical care time was exclusive of separately billable procedures and treating other patients. Critical care was necessary to treat or prevent imminent or life-threatening deterioration. Critical care was time spent personally by me on the following activities: development of treatment plan  with patient and/or surrogate as well as nursing, discussions with consultants, evaluation of patient's response to treatment, examination of patient, obtaining history from patient or surrogate, ordering and performing treatments and interventions, ordering and review of laboratory studies, ordering and review of radiographic studies, pulse oximetry and re-evaluation of patient's condition.           Final Clinical Impression(s) / ED Diagnoses Final diagnoses:  Chest pain, unspecified type  Syncope, unspecified syncope type  Elevated d-dimer  AKI (acute kidney injury) [facility])    Rx / DC Orders ED Discharge Orders     None         Gwyneth Sprout, MD 11/10/22 1927

## 2022-11-10 NOTE — ED Notes (Signed)
Patient transported to CT 

## 2022-11-11 ENCOUNTER — Encounter (HOSPITAL_COMMUNITY): Payer: Self-pay | Admitting: Internal Medicine

## 2022-11-11 ENCOUNTER — Observation Stay (HOSPITAL_COMMUNITY): Payer: 59

## 2022-11-11 ENCOUNTER — Observation Stay (HOSPITAL_BASED_OUTPATIENT_CLINIC_OR_DEPARTMENT_OTHER): Payer: 59

## 2022-11-11 DIAGNOSIS — J309 Allergic rhinitis, unspecified: Secondary | ICD-10-CM | POA: Diagnosis present

## 2022-11-11 DIAGNOSIS — R55 Syncope and collapse: Secondary | ICD-10-CM

## 2022-11-11 DIAGNOSIS — R109 Unspecified abdominal pain: Secondary | ICD-10-CM | POA: Diagnosis present

## 2022-11-11 LAB — CBC
HCT: 38.5 % (ref 36.0–46.0)
Hemoglobin: 12.2 g/dL (ref 12.0–15.0)
MCH: 29 pg (ref 26.0–34.0)
MCHC: 31.7 g/dL (ref 30.0–36.0)
MCV: 91.7 fL (ref 80.0–100.0)
Platelets: 217 10*3/uL (ref 150–400)
RBC: 4.2 MIL/uL (ref 3.87–5.11)
RDW: 15.1 % (ref 11.5–15.5)
WBC: 11.6 10*3/uL — ABNORMAL HIGH (ref 4.0–10.5)
nRBC: 0 % (ref 0.0–0.2)

## 2022-11-11 LAB — ECHOCARDIOGRAM COMPLETE
Area-P 1/2: 3.74 cm2
Calc EF: 64.1 %
Height: 60 in
MV M vel: 4.82 m/s
MV Peak grad: 92.9 mmHg
MV VTI: 2.63 cm2
S' Lateral: 2.8 cm
Single Plane A2C EF: 59.9 %
Single Plane A4C EF: 67.6 %
Weight: 2848 oz

## 2022-11-11 LAB — COMPREHENSIVE METABOLIC PANEL
ALT: 11 U/L (ref 0–44)
AST: 17 U/L (ref 15–41)
Albumin: 3.4 g/dL — ABNORMAL LOW (ref 3.5–5.0)
Alkaline Phosphatase: 54 U/L (ref 38–126)
Anion gap: 10 (ref 5–15)
BUN: 28 mg/dL — ABNORMAL HIGH (ref 8–23)
CO2: 19 mmol/L — ABNORMAL LOW (ref 22–32)
Calcium: 8.8 mg/dL — ABNORMAL LOW (ref 8.9–10.3)
Chloride: 107 mmol/L (ref 98–111)
Creatinine, Ser: 1.76 mg/dL — ABNORMAL HIGH (ref 0.44–1.00)
GFR, Estimated: 31 mL/min — ABNORMAL LOW (ref >60)
Glucose, Bld: 110 mg/dL — ABNORMAL HIGH (ref 70–99)
Potassium: 3.6 mmol/L (ref 3.5–5.1)
Sodium: 136 mmol/L (ref 135–145)
Total Bilirubin: 1 mg/dL (ref 0.3–1.2)
Total Protein: 6.6 g/dL (ref 6.5–8.1)

## 2022-11-11 LAB — MAGNESIUM: Magnesium: 1.9 mg/dL (ref 1.7–2.4)

## 2022-11-11 LAB — HEPARIN LEVEL (UNFRACTIONATED)
Heparin Unfractionated: >1.1 IU/mL — ABNORMAL HIGH (ref 0.30–0.70)
Heparin Unfractionated: 0.69 IU/mL (ref 0.30–0.70)
Heparin Unfractionated: 0.42 IU/mL (ref 0.30–0.70)

## 2022-11-11 MED ORDER — TECHNETIUM TO 99M ALBUMIN AGGREGATED
4.4000 | Freq: Once | INTRAVENOUS | Status: AC
  Administered 2022-11-11: 4.4 via INTRAVENOUS

## 2022-11-11 MED ORDER — AMOXICILLIN-POT CLAVULANATE 875-125 MG PO TABS: 1.0000 | ORAL_TABLET | Freq: Two times a day (BID) | ORAL | Status: DC

## 2022-11-11 MED ORDER — TACROLIMUS 1 MG PO CAPS
4.0000 mg | ORAL_CAPSULE | Freq: Every day | ORAL | Status: DC
  Administered 2022-11-11 – 2022-11-12 (×2): 4 mg via ORAL
  Filled 2022-11-11 (×2): qty 4

## 2022-11-11 MED ORDER — ACETAMINOPHEN 650 MG RE SUPP: 650.0000 mg | Freq: Four times a day (QID) | RECTAL | Status: DC | PRN

## 2022-11-11 MED ORDER — LORAZEPAM 2 MG/ML IJ SOLN
1.0000 mg | Freq: Once | INTRAMUSCULAR | Status: AC | PRN
  Administered 2022-11-11: 1 mg via INTRAVENOUS
  Filled 2022-11-11: qty 1

## 2022-11-11 MED ORDER — DIPHENHYDRAMINE-APAP (SLEEP) 25-500 MG PO TABS: 1.0000 | ORAL_TABLET | Freq: Every evening | ORAL | Status: DC | PRN

## 2022-11-11 MED ORDER — MYCOPHENOLATE SODIUM 180 MG PO TBEC
180.0000 mg | DELAYED_RELEASE_TABLET | Freq: Two times a day (BID) | ORAL | Status: DC
  Administered 2022-11-11 – 2022-11-12 (×3): 180 mg via ORAL
  Filled 2022-11-11 (×3): qty 1

## 2022-11-11 MED ORDER — MAGNESIUM SULFATE 2 GM/50ML IV SOLN
2.0000 g | Freq: Once | INTRAVENOUS | Status: AC
  Administered 2022-11-11: 2 g via INTRAVENOUS
  Filled 2022-11-11: qty 50

## 2022-11-11 MED ORDER — ACETAMINOPHEN 325 MG PO TABS
650.0000 mg | ORAL_TABLET | Freq: Four times a day (QID) | ORAL | Status: DC | PRN
  Administered 2022-11-11: 650 mg via ORAL
  Filled 2022-11-11: qty 2

## 2022-11-11 MED ORDER — AMLODIPINE BESYLATE 5 MG PO TABS
5.0000 mg | ORAL_TABLET | Freq: Every day | ORAL | Status: DC
  Administered 2022-11-11 – 2022-11-12 (×2): 5 mg via ORAL
  Filled 2022-11-11: qty 1

## 2022-11-11 MED ORDER — TACROLIMUS 1 MG PO CAPS
2.0000 mg | ORAL_CAPSULE | Freq: Every day | ORAL | Status: DC
  Administered 2022-11-11: 2 mg via ORAL
  Filled 2022-11-11: qty 2

## 2022-11-11 MED ORDER — DIPHENHYDRAMINE HCL 25 MG PO CAPS: 25.0000 mg | ORAL_CAPSULE | Freq: Every evening | ORAL | Status: DC | PRN

## 2022-11-11 MED ORDER — HEPARIN (PORCINE) 25000 UT/250ML-% IV SOLN
850.0000 [IU]/h | INTRAVENOUS | Status: DC
  Administered 2022-11-11: 850 [IU]/h via INTRAVENOUS
  Filled 2022-11-11: qty 250

## 2022-11-11 MED ORDER — ROSUVASTATIN CALCIUM 20 MG PO TABS
20.0000 mg | ORAL_TABLET | Freq: Every day | ORAL | Status: DC
  Administered 2022-11-11 – 2022-11-12 (×2): 20 mg via ORAL
  Filled 2022-11-11 (×2): qty 1

## 2022-11-11 MED ORDER — AMOXICILLIN-POT CLAVULANATE 500-125 MG PO TABS
1.0000 | ORAL_TABLET | Freq: Two times a day (BID) | ORAL | Status: DC
  Administered 2022-11-11 – 2022-11-12 (×3): 1 via ORAL
  Filled 2022-11-11 (×3): qty 1

## 2022-11-11 MED ORDER — LORAZEPAM 1 MG PO TABS: 2.0000 mg | ORAL_TABLET | Freq: Two times a day (BID) | ORAL | Status: DC | PRN

## 2022-11-11 MED ORDER — ACETAMINOPHEN 500 MG PO TABS: 500.0000 mg | ORAL_TABLET | Freq: Every evening | ORAL | Status: DC | PRN

## 2022-11-11 MED ORDER — LORATADINE 10 MG PO TABS
10.0000 mg | ORAL_TABLET | Freq: Every day | ORAL | Status: DC
  Administered 2022-11-12: 10 mg via ORAL
  Filled 2022-11-11 (×2): qty 1

## 2022-11-11 MED ORDER — CARVEDILOL 12.5 MG PO TABS
12.5000 mg | ORAL_TABLET | Freq: Two times a day (BID) | ORAL | Status: DC
  Administered 2022-11-11 – 2022-11-12 (×2): 12.5 mg via ORAL
  Filled 2022-11-11: qty 1

## 2022-11-11 NOTE — Progress Notes (Signed)
ANTICOAGULATION CONSULT NOTE - follow up  Pharmacy Consult for Heparin Indication: possible PE  Allergies  Allergen Reactions   Keflex [Cephalexin] Anaphylaxis    Patient Measurements: Height: 5' (152.4 cm) Weight: 80.7 kg (178 lb) IBW/kg (Calculated) : 45.5 Heparin Dosing Weight: 64 kg  Vital Signs: Temp: 99.2 F (37.3 C) (06/29 0300) Temp Source: Oral (06/29 0300) BP: 140/75 (06/29 0300) Pulse Rate: 85 (06/29 0300)  Labs: Recent Labs    11/10/22 1801 11/10/22 1942 11/10/22 2005 11/11/22 0504  HGB 11.9*  --   --  12.2  HCT 37.5  --   --  38.5  PLT 207  --   --  217  APTT  --  22*  --   --   LABPROT  --  15.0  --   --   INR  --  1.2  --   --   HEPARINUNFRC  --   --   --  >1.10*  CREATININE 2.00*  --   --   --   TROPONINIHS 5  --  5  --      Estimated Creatinine Clearance: 24.6 mL/min (A) (by C-G formula based on SCr of 2 mg/dL (H)).   Medical History: Past Medical History:  Diagnosis Date   Cataract    [employer] 11/24/2019- removed both   Chronic kidney disease    [employer] 11/24/2019   Clotting disorder (HCC)    PE 2010 per pt there really was not a PE   Fistula    LUE; was on dialysis for 11 months   Heart murmur    Hypertension    [employer] 11/24/2019   Kidney transplanted    transplant team [facility] [place])   Pre-diabetes    no meds   Pulmonary embolism (HCC) 2010   never symptomatic    Medications:  Scheduled:   loratadine  10 mg Oral Daily   mycophenolate  180 mg Oral BID   rosuvastatin  20 mg Oral Daily   tacrolimus  2 mg Oral QHS   tacrolimus  4 mg Oral Daily   Infusions:   sodium chloride 125 mL/hr at 11/11/22 0022   heparin 1,050 Units/hr (11/10/22 2122)   magnesium sulfate bolus IVPB      Assessment: 34 yoF presents to ED with syncope.  Pharmacy is consulted to dose Heparin for possible PE pending VQ scan.  PMH is significant for renal transplant (2016) with baseline SCr 1.6-1.8, and PE (2010). No prior to admit  anticoagulants.  Baseline coags ip [place] LE: negative for DVT CBC: Hgb decreased to 11.9, Plt 207 SCr 2, above baseline  11/11/2022 HL > 1.1 supra-therapeutic on 1050 units/hr CBC WNL Heparin running in rt upper arm, turned off and level drawn in rt hand Left arm can't be used for levels No bleeding per RN   Goal of Therapy:  Heparin level 0.3-0.7 units/ml Monitor platelets by anticoagulation protocol: Yes   Plan:  Hold heparin x 1 hour then decrease heparin IV infusion to 850 units/hr Heparin level in 8 hours  Daily heparin level and CBC  Arley Phenix RPh 11/11/2022, 5:39 AM

## 2022-11-11 NOTE — Progress Notes (Signed)
PHARMACY NOTE:  ANTIMICROBIAL RENAL DOSAGE ADJUSTMENT  Current antimicrobial regimen includes a mismatch between antimicrobial dosage and estimated renal function.  As per policy approved by the Pharmacy & Therapeutics and Medical Executive Committees, the antimicrobial dosage will be adjusted accordingly.  Current antimicrobial dosage:  Augmentin 875-125 mg BID   Renal Function:  Estimated Creatinine Clearance: 28 mL/min (A) (by C-G formula based on SCr of 1.76 mg/dL (H)).     Antimicrobial dosage has been changed to:  Augmentin 500-125 mg BID  Additional comments: once CrCl > 30, can consider changing back to 875 mg   Thank you for allowing pharmacy to be a part of this patient's care.  Pricilla Riffle, PharmD, BCPS Clinical Pharmacist 11/11/2022 11:52 AM

## 2022-11-11 NOTE — Progress Notes (Signed)
ANTICOAGULATION CONSULT NOTE - follow up  Pharmacy Consult for Heparin Indication: possible PE  Allergies  Allergen Reactions   Keflex [Cephalexin] Anaphylaxis    Patient Measurements: Height: 5' (152.4 cm) Weight: 80.7 kg (178 lb) IBW/kg (Calculated) : 45.5 Heparin Dosing Weight: 64 kg  Vital Signs: Temp: 97.8 F (36.6 C) (06/29 1442) Temp Source: Oral (06/29 1442) BP: 133/81 (06/29 1442) Pulse Rate: 69 (06/29 1442)  Labs: Recent Labs    11/10/22 1801 11/10/22 1942 11/10/22 2005 11/11/22 0504 11/11/22 1423  HGB 11.9*  --   --  12.2  --   HCT 37.5  --   --  38.5  --   PLT 207  --   --  217  --   APTT  --  22*  --   --   --   LABPROT  --  15.0  --   --   --   INR  --  1.2  --   --   --   HEPARINUNFRC  --   --   --  >1.10* 0.69  CREATININE 2.00*  --   --  1.76*  --   TROPONINIHS 5  --  5  --   --      Estimated Creatinine Clearance: 28 mL/min (A) (by C-G formula based on SCr of 1.76 mg/dL (H)).   Assessment: 72 yoF presents to ED with syncope.  Pharmacy is consulted to dose Heparin for possible PE pending VQ scan.  PMH is significant for renal transplant (2016) with baseline SCr 1.6-1.8, and PE (2010). No prior to admit anticoagulants.  Baseline coags ip [place] LE: negative for DVT CBC: Hgb decreased to 11.9, Plt 207 SCr 2, above baseline  11/11/2022 05 am HL > 1.1 supra-therapeutic on 1050 units/hr CBC WNL Heparin running in rt upper arm, turned off and level drawn in rt hand Left arm can't be used for levels HL drawn at 1432 = 0.69 - therapeutic after heparin drip held x 1 hours then decreased to 850 units/hr CBC WNL 6/29 NM pulm perfusion study: inconclusive for PE No bleeding per RN   Goal of Therapy:  Heparin level 0.3-0.7 units/ml Monitor platelets by anticoagulation protocol: Yes   Plan:  Continue heparin IV infusion @ 850 units/hr Confirmatory Heparin level in 8 hours  Daily heparin level and CBC  Herby Abraham, Pharm.D Use secure chat for  questions 11/11/2022 4:28 PM

## 2022-11-11 NOTE — Progress Notes (Signed)
  Echocardiogram 2D Echocardiogram has been performed.  Milda Smart 11/11/2022, 3:42 PM

## 2022-11-11 NOTE — Progress Notes (Signed)
ANTICOAGULATION CONSULT NOTE - follow up  Pharmacy Consult for Heparin Indication: possible PE  Allergies  Allergen Reactions   Keflex [Cephalexin] Anaphylaxis    Patient Measurements: Height: 5' (152.4 cm) Weight: 80.7 kg (178 lb) IBW/kg (Calculated) : 45.5 Heparin Dosing Weight: 64 kg  Vital Signs: Temp: 98.2 F (36.8 C) (06/29 2030) Temp Source: Oral (06/29 2030) BP: 132/72 (06/29 2030) Pulse Rate: 72 (06/29 2030)  Labs: Recent Labs    11/10/22 1801 11/10/22 1942 11/10/22 2005 11/11/22 0504 11/11/22 1423 11/11/22 2231  HGB 11.9*  --   --  12.2  --   --   HCT 37.5  --   --  38.5  --   --   PLT 207  --   --  217  --   --   APTT  --  22*  --   --   --   --   LABPROT  --  15.0  --   --   --   --   INR  --  1.2  --   --   --   --   HEPARINUNFRC  --   --   --  >1.10* 0.69 0.42  CREATININE 2.00*  --   --  1.76*  --   --   TROPONINIHS 5  --  5  --   --   --      Estimated Creatinine Clearance: 28 mL/min (A) (by C-G formula based on SCr of 1.76 mg/dL (H)).   Assessment: 60 yoF presents to ED with syncope.  Pharmacy is consulted to dose Heparin for possible PE pending VQ scan.  PMH is significant for renal transplant (2016) with baseline SCr 1.6-1.8, and PE (2010). No prior to admit anticoagulants.  Baseline coags ip [place] LE: negative for DVT CBC: Hgb decreased to 11.9, Plt 207 SCr 2, above baseline  11/11/2022 05 am HL > 1.1 supra-therapeutic on 1050 units/hr CBC WNL Heparin running in rt upper arm, turned off and level drawn in rt hand Left arm can't be used for levels HL drawn at 1432 = 0.69 - therapeutic after heparin drip held x 1 hours then decreased to 850 units/hr CBC WNL 6/29 NM pulm perfusion study: inconclusive for PE No bleeding per RN  2331 HL 0.42 therapeutic on 850 units/hr No bleeding noted  Goal of Therapy:  Heparin level 0.3-0.7 units/ml Monitor platelets by anticoagulation protocol: Yes   Plan:  Continue heparin IV infusion @ 850  units/hr Daily heparin level and CBC  Arley Phenix RPh 11/11/2022, 11:51 PM

## 2022-11-11 NOTE — Progress Notes (Addendum)
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  Reports several days of pleuritic type chest pain worse with deep breathing.  On the day of admission, she was complaining of feeling weak, lightheaded, and felt like she was about to pass out.  Granddaughter was with her and helped her lay down, and eventually family appreciates that she may have lost consciousness.  She woke up in less than a minute, no confusion noted.  Subjective / 24h Interval events: No chest pain this morning, no shortness of breath.  Assesement and Plan: Principal Problem:   Syncope Active Problems:   CKD stage 3b, GFR 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)   Essential hypertension   Hyperlipidemia   Abdominal pain   Hypomagnesemia   Allergic rhinitis   Principal problem Syncope, few days of pleuritic type chest pain -definitely concerning for PE given prior similar history.  She was empirically started on heparin, continue for now.  Cannot obtain CT angiogram due to chronic kidney disease, VQ scan has been ordered.  Due to being weekend, this is not done routinely, I have called nuclear medicine, looks like they will do the perfusion study today. -Obtain a 2D echo as well  Active problems Abdominal discomfort -some on and off cramps over the last few days.  CT scan of the abdomen pelvis showed colonic diverticulosis with uncomplicated early sigmoid colon diverticulitis.   Hypertension -resume home medications today  Hypomagnesemia -magnesium replaced, normalized this morning  Hyperlipidemia -on statin  History of renal transplant, CKD 3B -status post renal transplant 8 years ago.  Currently creatinine is at baseline.  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  Further management as an outpatient, defer to PCP / also seeing GynOnc  Scheduled Meds:  loratadine  10 mg Oral Daily   mycophenolate  180 mg Oral BID   rosuvastatin  20 mg Oral Daily   tacrolimus  2 mg Oral QHS   tacrolimus  4 mg Oral Daily   Continuous Infusions:  sodium chloride 125 mL/hr at 11/11/22 0022   heparin 850 Units/hr (11/11/22 0643)   PRN Meds:.acetaminophen **OR** acetaminophen, fentaNYL (SUBLIMAZE) injection, LORazepam, melatonin, naLOXone (NARCAN)  injection, ondansetron (ZOFRAN) IV  Current Outpatient Medications  Medication Instructions   amLODipine (NORVASC) 5 mg, Oral, Daily   aspirin EC 81 mg, Oral, Daily, Swallow whole.    carvedilol (COREG) 12.5 mg, Oral, 2 times daily with meals   cetirizine (ZYRTEC) 10 mg, Oral, Daily   Coenzyme Q10 (CO Q10 PO) 1 capsule, Oral, Daily   diclofenac Sodium (VOLTAREN) 2 g, Topical, 4 times daily   diphenhydramine-acetaminophen (TYLENOL PM) 25-500 MG TABS tablet 1 tablet, Oral, At bedtime PRN   lisinopril (ZESTRIL) 5 mg, Oral, Daily   LORazepam (ATIVAN) 2 mg, Oral, 2 times daily PRN   magnesium oxide (MAG-OX) 400 mg, Oral, Daily   mycophenolate (MYFORTIC) 180 mg, Oral, 2 times daily   rosuvastatin (CRESTOR) 20 mg, Oral, Daily   tacrolimus (PROGRAF) 1 MG capsule TAKE 4 CAPSULE BY MOUTH EVERY MORNING AND 2 CAPSULE BY MOUTH EVERY EVENING   Vitamin D3 400 Units, Oral, Daily    Diet Orders (From admission, onward)     Start  Ordered   11/11/22 0519  Diet regular Room service appropriate? Yes; Fluid consistency: Thin  Diet effective now       Question Answer Comment  Room service appropriate? Yes   Fluid consistency: Thin      11/11/22 0518            DVT prophylaxis:    Lab Results  Component Value Date   PLT 217 11/11/2022      Code Status: Full Code  Family Communication: no family at bedside   Status is: Observation The  patient will require care spanning > 2 midnights and should be moved to inpatient because: VQ scan, echo   Level of care: Telemetry  Consultants:  none  Objective: Vitals:   11/10/22 2303 11/11/22 0300 11/11/22 0625 11/11/22 1005  BP: (!) 154/93 (!) 140/75 (!) 152/88 134/81  Pulse: 89 85 82 73  Resp: 18 18 18 16   Temp:  99.2 F (37.3 C) 99.1 F (37.3 C)   TempSrc:  Oral Oral   SpO2: 97% 100% 100% 100%  Weight:      Height:       No intake or output data in the 24 hours ending 11/11/22 1109 Wt Readings from Last 3 Encounters:  11/10/22 80.7 kg  11/02/22 85.3 kg  10/06/22 86.5 kg    Examination:  Constitutional: NAD Eyes: no scleral icterus ENMT: Mucous membranes are moist.  Neck: normal, supple Respiratory: clear to auscultation bilaterally, no wheezing, no crackles. Normal respiratory effort. No accessory muscle use.  Cardiovascular: Regular rate and rhythm, no murmurs / rubs / gallops. No LE edema.  Abdomen: non distended, no tenderness. Bowel sounds positive.  Musculoskeletal: no clubbing / cyanosis.    Data Reviewed: I have independently reviewed following labs and imaging studies  CBC Recent Labs  Lab 11/10/22 1801 11/11/22 0504  WBC 10.7* 11.6*  HGB 11.9* 12.2  HCT 37.5 38.5  PLT 207 217  MCV 89.9 91.7  MCH 28.5 29.0  MCHC 31.7 31.7  RDW 15.2 15.1  LYMPHSABS 1.1  --   MONOABS 1.5*  --   EOSABS 0.0  --   BASOSABS 0.0  --     Recent Labs  Lab 11/10/22 1801 11/10/22 1942 11/10/22 2005 11/11/22 0504  NA 140  --   --  136  K 4.5  --   --  3.6  CL 107  --   --  107  CO2 25  --   --  19*  GLUCOSE 194*  --   --  110*  BUN 29*  --   --  28*  CREATININE 2.00*  --   --  1.76*  CALCIUM 9.3  --   --  8.8*  AST 17  --   --  17  ALT 13  --   --  11  ALKPHOS 53  --   --  54  BILITOT 0.9  --   --  1.0  ALBUMIN 3.3*  --   --  3.4*  MG  --   --  1.6* 1.9  DDIMER 2.57*  --   --   --   LATICACIDVEN 1.4  --  1.0  --   INR  --  1.2  --   --      ------------------------------------------------------------------------------------------------------------------ No results for input(s): "CHOL", "HDL", "LDLCALC", "TRIG", "CHOLHDL", "LDLDIRECT" in the last 72 hours.  Lab Results  Component Value Date   HGBA1C 6.5 (H) 10/14/2021   ------------------------------------------------------------------------------------------------------------------ No results for input(s): "  TSH", "T4TOTAL", "T3FREE", "THYROIDAB" in the last 72 hours.  Invalid input(s): "FREET3"  Cardiac Enzymes No results for input(s): "CKMB", "TROPONINI", "MYOGLOBIN" in the last 168 hours.  Invalid input(s): "CK" ------------------------------------------------------------------------------------------------------------------ No results found for: "BNP"  CBG: No results for input(s): "GLUCAP" in the last 168 hours.  Recent Results (from the past 240 hour(s))  SARS Coronavirus 2 by RT PCR (hospital order, performed in [facility] hospital lab) *cepheid single result test* Anterior Nasal Swab     Status: None   Collection Time: 11/10/22  6:01 PM   Specimen: Anterior Nasal Swab  Result Value Ref Range Status   SARS Coronavirus 2 by RT PCR NEGATIVE NEGATIVE Final    Comment: (NOTE) SARS-CoV-2 target nucleic acids are NOT DETECTED.  The SARS-CoV-2 RNA is generally detectable in upper and lower respiratory specimens during the acute phase of infection. The lowest concentration of SARS-CoV-2 viral copies this assay can detect is 250 copies / mL. A negative result does not preclude SARS-CoV-2 infection and should not be used as the sole basis for treatment or other patient management decisions.  A negative result may occur with improper specimen collection / handling, submission of specimen other than nasopharyngeal swab, presence of viral mutation(s) within the areas targeted by this assay, and inadequate number of viral copies (<250 copies / mL). A negative  result must be combined with clinical observations, patient history, and epidemiological information.  Fact Sheet for Patients:   RoadLapTop.co.za  Fact Sheet for Healthcare Providers: http://kim-miller.com/  This test is not yet approved or  cleared by the [place] FDA and has been authorized for detection and/or diagnosis of SARS-CoV-2 by FDA under an Emergency Use Authorization (EUA).  This EUA will remain in effect (meaning this test can be used) for the duration of the COVID-19 declaration under Section 564(b)(1) of the Act, 21 U.S.C. section 360bbb-3(b)(1), unless the authorization is terminated or revoked sooner.  Performed at [facility], [address], [place], [place] [address]      Radiology Studies: VAS [place] LOWER EXTREMITY VENOUS (DVT) (7a-7p)  Result Date: 11/10/2022  Lower Venous DVT Study Patient Name:  EILYN MATO  Date of Exam:   11/10/2022 Medical Rec #: [id number]        Accession #:    [id number] Date of Birth: 05/13/1951        Patient Gender: F Patient Age:   35 years Exam Location:  [facility] Procedure:      VAS [place] LOWER EXTREMITY VENOUS (DVT) Referring Phys: Gwyneth Sprout --------------------------------------------------------------------------------  Indications: Pain.  Risk Factors: HX of PE per patient. Limitations: Poor ultrasound/tissue interface. Comparison Study: Previous RLEV exam on 08/27/2015 was negative for DVT Performing Technologist: Ernestene Mention RVT, RDMS  Examination Guidelines: A complete evaluation includes B-mode imaging, spectral Doppler, color Doppler, and power Doppler as needed of all accessible portions of each vessel. Bilateral testing is considered an integral part of a complete examination. Limited examinations for reoccurring indications may be performed as noted. The reflux portion of the exam is performed with the patient in reverse Trendelenburg.   +---------+---------------+---------+-----------+----------+-------------------+ RIGHT    CompressibilityPhasicitySpontaneityPropertiesThrombus Aging      +---------+---------------+---------+-----------+----------+-------------------+ CFV      Full           Yes      Yes                                      +---------+---------------+---------+-----------+----------+-------------------+  SFJ      Full                                                             +---------+---------------+---------+-----------+----------+-------------------+ FV Prox  Full           Yes      Yes                                      +---------+---------------+---------+-----------+----------+-------------------+ FV Mid   Full           Yes      Yes                                      +---------+---------------+---------+-----------+----------+-------------------+ FV DistalFull           Yes      Yes                                      +---------+---------------+---------+-----------+----------+-------------------+ PFV      Full                                                             +---------+---------------+---------+-----------+----------+-------------------+ POP      Full           Yes      Yes                                      +---------+---------------+---------+-----------+----------+-------------------+ PTV      Full                                                             +---------+---------------+---------+-----------+----------+-------------------+ PERO                                                  Not well visualized +---------+---------------+---------+-----------+----------+-------------------+   +----+---------------+---------+-----------+----------+--------------+ LEFTCompressibilityPhasicitySpontaneityPropertiesThrombus Aging +----+---------------+---------+-----------+----------+--------------+ CFV Full           Yes       Yes                                 +----+---------------+---------+-----------+----------+--------------+    Summary: RIGHT: - There is no evidence of deep vein thrombosis in the lower extremity.  - A large cystic structure is found in the popliteal fossa (6.1 x 1.2 x 2.7 cm).  LEFT: - No evidence of common femoral vein  obstruction.  *See table(s) above for measurements and observations. Electronically signed by Coral Else MD on 11/10/2022 at 10:48:01 PM.    Final    CT ABDOMEN PELVIS WO CONTRAST  Result Date: 11/10/2022 CLINICAL DATA:  lower abd pain over prior renal transplant EXAM: CT ABDOMEN AND PELVIS WITHOUT CONTRAST TECHNIQUE: Multidetector CT imaging of the abdomen and pelvis was performed following the standard protocol without IV contrast. RADIATION DOSE REDUCTION: This exam was performed according to the departmental dose-optimization program which includes automated exposure control, adjustment of the mA and/or kV according to patient size and/or use of iterative reconstruction technique. COMPARISON:  CT abdomen pelvis 01/13/2022 FINDINGS: Lower chest: No acute abnormality. Hepatobiliary: No focal liver abnormality. Status post cholecystectomy. No biliary dilatation. Pancreas: No focal lesion. Normal pancreatic contour. No surrounding inflammatory changes. No main pancreatic ductal dilatation. Spleen: Normal in size without focal abnormality. Adrenals/Urinary Tract: There is a able 2.5 x 2.7 cm left adrenal gland nodule with a density of 36 Hounsfield units. No right adrenal gland nodule. Bilateral native kidney atrophy status post right iliac fossa renal transplant. No hydronephrosis. No hydroureter.  No nephroureterolithiasis. The urinary bladder is unremarkable. Stomach/Bowel: Stomach is within normal limits. No evidence of small bowel wall thickening or dilatation. Mild focal bowel wall thickening and pericolonic fat stranding along a distal descending colon/proximal sigmoid colon  diverticula (8:74, 2:47). Colonic diverticulosis. Appendix appears normal. Vascular/Lymphatic: No abdominal aorta or iliac aneurysm. Moderate atherosclerotic plaque of the aorta and its branches. No abdominal, pelvic, or inguinal lymphadenopathy. Reproductive: Uterus and bilateral adnexa are unremarkable. Other: No intraperitoneal free fluid. No intraperitoneal free gas. No organized fluid collection. Musculoskeletal: No abdominal wall hernia or abnormality. No suspicious lytic or blastic osseous lesions. No acute displaced fracture. L4-L5 posterolateral and interbody surgical hardware fusion. IMPRESSION: 1. Colonic diverticulosis with uncomplicated early/mild distal descending colon/proximal sigmoid colon diverticulitis. 2. Atrophic native kidneys with a right iliac fossa renal transplant. 3. 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 4. Recommend adrenal washout CT or chemical shift MRI. JACR 2017 Aug; 14(8):1038-44, JCAT 2016 Mar-Apr; 40(2):194-200, Urol J 2006 Spring; 3(2):71-4. 5.  Aortic Atherosclerosis (ICD10-I70.0). 6. Limited evaluation on this noncontrast study. Electronically Signed   By: Tish Frederickson M.D.   On: 11/10/2022 21:38   DG Chest Port 1 View  Result Date: 11/10/2022 CLINICAL DATA:  Syncope and chest pain EXAM: PORTABLE CHEST 1 VIEW COMPARISON:  X-ray 03/18/2022 FINDINGS: Calcified and tortuous aorta. Normal cardiopericardial silhouette. No consolidation, pneumothorax or effusion. No edema. Overlapping cardiac leads. Degenerative changes seen of the spine. IMPRESSION: No acute cardiopulmonary disease Electronically Signed   By: Karen Kays M.D.   On: 11/10/2022 17:43     Pamella Pert, MD, PhD [facility]  Between 7 am - 7 pm I am available, please contact me via Amion (for emergencies) or Securechat (non urgent messages)  Between 7 pm - 7 am I am not available, please contact night coverage MD/APP via Amion

## 2022-11-12 DIAGNOSIS — R55 Syncope and collapse: Secondary | ICD-10-CM | POA: Diagnosis not present

## 2022-11-12 LAB — CBC
HCT: 36.2 % (ref 36.0–46.0)
Hemoglobin: 11.4 g/dL — ABNORMAL LOW (ref 12.0–15.0)
MCH: 28.9 pg (ref 26.0–34.0)
MCHC: 31.5 g/dL (ref 30.0–36.0)
MCV: 91.9 fL (ref 80.0–100.0)
Platelets: 194 10*3/uL (ref 150–400)
RBC: 3.94 MIL/uL (ref 3.87–5.11)
RDW: 15.3 % (ref 11.5–15.5)
WBC: 6.4 10*3/uL (ref 4.0–10.5)
nRBC: 0 % (ref 0.0–0.2)

## 2022-11-12 LAB — HEPARIN LEVEL (UNFRACTIONATED): Heparin Unfractionated: 0.65 IU/mL (ref 0.30–0.70)

## 2022-11-12 MED ORDER — AMOXICILLIN-POT CLAVULANATE 500-125 MG PO TABS: 1.0000 | ORAL_TABLET | Freq: Two times a day (BID) | ORAL | 0 refills | Status: AC

## 2022-11-12 NOTE — Care Management Obs Status (Signed)
MEDICARE OBSERVATION STATUS NOTIFICATION   Patient Details  Name: Megan Bradford MRN: [id number] Date of Birth: 03-02-1951   Medicare Observation Status Notification Given:  Yes    Adrian Prows, RN 11/12/2022, 8:34 AM

## 2022-11-12 NOTE — Progress Notes (Signed)
Patient discharged to home with family, discharge instructions reviewed with patient who verbalized understanding. 

## 2022-11-12 NOTE — Discharge Summary (Signed)
Physician Discharge Summary  Megan Bradford UEA:[id number] DOB: [birth year] DOA: 11/10/2022  PCP: Georgina Quint, MD  Admit date: 11/10/2022 Discharge date: 11/12/2022  Admitted From: home Disposition:  home  Recommendations for Outpatient Follow-up:  Follow up with PCP in 1-2 weeks  Home Health: none Equipment/Devices: none  Discharge Condition: stable CODE STATUS: Full code Diet Orders (From admission, onward)     Start     Ordered   11/11/22 0519  Diet regular Room service appropriate? Yes; Fluid consistency: Thin  Diet effective now       Question Answer Comment  Room service appropriate? Yes   Fluid consistency: Thin      11/11/22 0518            HPI: Per admitting MD, Megan Bradford is a 72 y.o. 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 The patient reports a single episode of loss of consciousness that occurred earlier in the day.  She notes that she had just finished eating lunch at [employer] and had just stood up to ambulate to a store to do some shopping with her granddaughter, when she became dizzy, lightheaded and experienced a subjective sensation of impending loss of consciousness as well as nausea/vomiting, before formally losing consciousness.  The patient's granddaughter was at her side, and prevented her from falling.  No trauma.  Granddaughter conveyed that the patient remained unconscious for less than 1 minute, before fully regaining consciousness in the absence of any corresponding confusion.  This was not associate with any tonic-clonic activity nor any tongue biting or loss of bowel/bladder function.  Patient notes that she had a similar episode of syncope 1 to 2  years ago. She has a history of pulmonary embolism 10 years ago as well as existing diagnosis of stage I pelvic cancer.  She reports that she has been experiencing some crampy generalized abdominal discomfort over the course of the last few days.  She also reports some pleuritic left-sided chest discomfort over the last few days.  Not associate any shortness of breath or hemoptysis.  She notes some preceding right calf tenderness that is subsequently resolved.  Denies any residual calf tenderness or any other new lower extremity erythema.  Denies any exertional chest pain. History of renal cell transplant 8 years ago performed at [facility] in [place]. No recent acute focal weakness or any acute focal numbness, paresthesias, vertigo, dysarthria, facial droop, or acute change in vision.  Hospital Course / Discharge diagnoses: Principal Problem:   Syncope Active Problems:   CKD stage 3b, GFR 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)   Essential hypertension   Hyperlipidemia   Abdominal pain   Hypomagnesemia   Allergic rhinitis   Principal problem Syncope, few days of pleuritic type chest pain -her pleuritic type chest pain associated with elevated D-dimer was concerning for a PE.  Given history of renal transplantation chronic kidney disease, CT angiogram could not be obtained.  She underwent a VQ scan on 6/29 which showed a moderate-sized area of decreased tracer activity in the posterior left upper lung field, without wedge shaped perfusion defects, and the study was inconclusive for evaluation for PE.  Discussed extensively with the patient and the daughter, that with the study being inconclusive we cannot for  certain say that she has or does not have a PE.  Risks and benefits were discussed about her going on anticoagulation and not having a PE, but also about her not going on anticoagulation and indeed having a PE, discussed various significant outcomes including death.  She expressed  understanding, and at this point she elects NOT to initiate anticoagulation.  Underwent a 2D echocardiogram as well which was fairly unremarkable with LVEF 65 to 65%, no WMA, grade 1 diastolic dysfunction.  Of note, RV was normal.  Other possible etiologies for her syncope include vasovagal, hypotension, but no clear apparent cause.  Telemetry was without significant arrhythmias   Active problems Abdominal discomfort -some on and off cramps over the last few days.  CT scan of the abdomen pelvis showed colonic diverticulosis with uncomplicated early sigmoid colon diverticulitis.  Has been placed on Augmentin, continue upon discharge for total of 10 days.  Abdominal pain improved Hypertension -continue home medications  Hypomagnesemia -magnesium replaced Hyperlipidemia -on statin History of renal transplant, CKD 3B -status post renal transplant 8 years ago.  Currently creatinine is at baseline. 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  Further management as an outpatient, defer to PCP / also seeing GynOnc  Sepsis ruled out   Discharge Instructions   Allergies as of 11/12/2022       Reactions   Keflex [cephalexin] Anaphylaxis        Medication List     TAKE these medications    amLODipine 5 MG tablet Commonly known as: NORVASC Take 5 mg by mouth daily.   aspirin EC 81 MG tablet Take 81 mg by mouth daily. Swallow whole.   carvedilol 12.5 MG tablet Commonly known as: COREG Take 12.5 mg by mouth 2 (two) times daily with a meal.   cetirizine 10 MG tablet Commonly known as: ZYRTEC Take 10 mg by mouth daily.   CO Q10 PO Take 1 capsule by mouth daily.   diclofenac Sodium 1 % Gel Commonly known as: VOLTAREN Apply 2 g topically 4 (four) times daily. What changed:  when to take this reasons to take this   diphenhydramine-acetaminophen 25-500 MG Tabs tablet Commonly known as: TYLENOL PM Take 1 tablet by mouth at bedtime as needed (As  needed).   lisinopril 5 MG tablet Commonly known as: ZESTRIL Take 5 mg by mouth daily.   LORazepam 2 MG tablet Commonly known as: ATIVAN Take 2 mg by mouth 2 (two) times daily as needed.   magnesium oxide 400 MG tablet Commonly known as: MAG-OX Take 400 mg by mouth daily.   mycophenolate 180 MG EC tablet Commonly known as: MYFORTIC Take 180 mg by mouth 2 (two) times daily.   rosuvastatin 20 MG tablet Commonly known as: CRESTOR Take 1 tablet (20 mg total) by mouth daily.   tacrolimus 1 MG capsule Commonly known as: PROGRAF TAKE 4 CAPSULE BY MOUTH EVERY MORNING AND 2 CAPSULE BY MOUTH EVERY EVENING What changed: See the new instructions.   Vitamin D3 10 MCG (400 UNIT) Caps Take 400 Units by mouth daily.        Follow-up Information     Georgina Quint, MD Follow up in 1 week(s).   Specialty: Internal Medicine Contact information: [address] [place] [address] [phone]                 Consultations: none  Procedures/Studies:  ECHOCARDIOGRAM COMPLETE  Result Date: 11/11/2022    ECHOCARDIOGRAM REPORT   Patient  Name:   HULENE CAMBRIA Date of Exam: 11/11/2022 Medical Rec #:  [id number]       Height:       60.0 in Accession #:    [id number]      Weight:       178.0 lb Date of Birth:  Sep 17, 1950       BSA:          1.776 m Patient Age:    71 years        BP:           133/81 mmHg Patient Gender: F               HR:           57 bpm. Exam Location:  Inpatient Procedure: 2D Echo, Cardiac Doppler and Color Doppler Indications:    Syncope  History:        Patient has prior history of Echocardiogram examinations, most                 recent 10/14/2021. Signs/Symptoms:Syncope; Risk                 Factors:Hypertension and Dyslipidemia. Hx of pulmonary embolism,                 renal cell transplant, CKD, pelvic CA.  Sonographer:    Milda Smart Referring Phys: [id number] Daylene Katayama Sabryn Preslar IMPRESSIONS  1. Left ventricular ejection fraction, by estimation, is 60 to  65%. The left ventricle has normal function. The left ventricle has no regional wall motion abnormalities. Left ventricular diastolic parameters are consistent with Grade I diastolic dysfunction (impaired relaxation).  2. Right ventricular systolic function is normal. The right ventricular size is normal. There is normal pulmonary artery systolic pressure. The estimated right ventricular systolic pressure is 32.4 mmHg.  3. Left atrial size was mildly dilated.  4. The mitral valve is grossly normal. Mild mitral valve regurgitation. No evidence of mitral stenosis.  5. The aortic valve is tricuspid. Aortic valve regurgitation is not visualized. No aortic stenosis is present.  6. The inferior vena cava is normal in size with greater than 50% respiratory variability, suggesting right atrial pressure of 3 mmHg. Comparison(s): No significant change from prior study. FINDINGS  Left Ventricle: Left ventricular ejection fraction, by estimation, is 60 to 65%. The left ventricle has normal function. The left ventricle has no regional wall motion abnormalities. The left ventricular internal cavity size was normal in size. There is  no left ventricular hypertrophy. Left ventricular diastolic parameters are consistent with Grade I diastolic dysfunction (impaired relaxation). Right Ventricle: The right ventricular size is normal. No increase in right ventricular wall thickness. Right ventricular systolic function is normal. There is normal pulmonary artery systolic pressure. The tricuspid regurgitant velocity is 2.71 m/s, and  with an assumed right atrial pressure of 3 mmHg, the estimated right ventricular systolic pressure is 32.4 mmHg. Left Atrium: Left atrial size was mildly dilated. Right Atrium: Right atrial size was normal in size. Pericardium: There is no evidence of pericardial effusion. Mitral Valve: The mitral valve is grossly normal. Mild mitral valve regurgitation. No evidence of mitral valve stenosis. MV peak gradient,  4.7 mmHg. The mean mitral valve gradient is 2.0 mmHg. Tricuspid Valve: The tricuspid valve is grossly normal. Tricuspid valve regurgitation is mild . No evidence of tricuspid stenosis. Aortic Valve: The aortic valve is tricuspid. Aortic valve regurgitation is not visualized. No aortic stenosis is present. Pulmonic Valve: The pulmonic valve was  grossly normal. Pulmonic valve regurgitation is trivial. No evidence of pulmonic stenosis. Aorta: The aortic root and ascending aorta are structurally normal, with no evidence of dilitation. Venous: The inferior vena cava is normal in size with greater than 50% respiratory variability, suggesting right atrial pressure of 3 mmHg. IAS/Shunts: The atrial septum is grossly normal.  LEFT VENTRICLE PLAX 2D LVIDd:         4.20 cm     Diastology LVIDs:         2.80 cm     LV e' medial:    5.11 cm/s LV PW:         0.90 cm     LV E/e' medial:  14.0 LV IVS:        1.00 cm     LV e' lateral:   7.62 cm/s LVOT diam:     1.80 cm     LV E/e' lateral: 9.4 LV SV:         62 LV SV Index:   35 LVOT Area:     2.54 cm  LV Volumes (MOD) LV vol d, MOD A2C: 52.1 ml LV vol d, MOD A4C: 61.7 ml LV vol s, MOD A2C: 20.9 ml LV vol s, MOD A4C: 20.0 ml LV SV MOD A2C:     31.2 ml LV SV MOD A4C:     61.7 ml LV SV MOD BP:      36.4 ml RIGHT VENTRICLE RV Basal diam:  3.80 cm RV S prime:     14.00 cm/s TAPSE (M-mode): 2.1 cm LEFT ATRIUM             Index        RIGHT ATRIUM           Index LA diam:        3.90 cm 2.20 cm/m   RA Area:     18.50 cm LA Vol (A2C):   61.5 ml 34.62 ml/m  RA Volume:   46.40 ml  26.12 ml/m LA Vol (A4C):   66.1 ml 37.21 ml/m LA Biplane Vol: 65.8 ml 37.04 ml/m  AORTIC VALVE LVOT Vmax:   103.00 cm/s LVOT Vmean:  76.000 cm/s LVOT VTI:    0.243 m  AORTA Ao Root diam: 2.50 cm Ao Asc diam:  4.00 cm MITRAL VALVE               TRICUSPID VALVE MV Area (PHT): 3.74 cm    TR Peak grad:   29.4 mmHg MV Area VTI:   2.63 cm    TR Mean grad:   21.0 mmHg MV Peak grad:  4.7 mmHg    TR Vmax:         271.00 cm/s MV Mean grad:  2.0 mmHg    TR Vmean:       222.0 cm/s MV Vmax:       1.08 m/s MV Vmean:      64.3 cm/s   SHUNTS MV Decel Time: 203 msec    Systemic VTI:  0.24 m MR Peak grad: 92.9 mmHg    Systemic Diam: 1.80 cm MR Vmax:      482.00 cm/s MV E velocity: 71.60 cm/s MV A velocity: 79.30 cm/s MV E/A ratio:  0.90 Lennie Odor MD Electronically signed by Lennie Odor MD Signature Date/Time: 11/11/2022/3:52:30 PM    Final    NM Pulmonary Perfusion  Result Date: 11/11/2022 CLINICAL DATA:  Syncope, chest pain, positive D-dimer EXAM: NUCLEAR MEDICINE PERFUSION LUNG SCAN TECHNIQUE: Perfusion images  were obtained in multiple projections after intravenous injection of radiopharmaceutical. Ventilation scans intentionally deferred if perfusion scan and chest x-ray adequate for interpretation during COVID 19 epidemic. RADIOPHARMACEUTICALS:  4.4 mCi Tc-72m MAA IV COMPARISON:  Chest radiographs done on 11/10/2022 FINDINGS: There is moderate sized area of ill-defined decreased uptake in the posterior left upper lung field. There is no focal infiltrate in left upper lung field in the chest radiographs. There are no wedge-shaped perfusion defects. IMPRESSION: There is moderate sized area of decreased tracer activity in the posterior left upper lung field. There are no wedge-shaped perfusion defects. Study is inconclusive for evaluation of PE. If clinically warranted, follow-up CT pulmonary angiogram may be considered. These results will be called to the ordering clinician or representative by the Radiologist Assistant, and communication documented in the PACS or [employer]. Electronically Signed   By: Ernie Avena M.D.   On: 11/11/2022 13:58   VAS [place] LOWER EXTREMITY VENOUS (DVT) (7a-7p)  Result Date: 11/10/2022  Lower Venous DVT Study Patient Name:  JUDEEN CASABLANCA  Date of Exam:   11/10/2022 Medical Rec #: [id number]        Accession #:    [id number] Date of Birth: [birth year]        Patient Gender: F  Patient Age:   26 years Exam Location:  [facility] Procedure:      VAS [place] LOWER EXTREMITY VENOUS (DVT) Referring Phys: Gwyneth Sprout --------------------------------------------------------------------------------  Indications: Pain.  Risk Factors: HX of PE per patient. Limitations: Poor ultrasound/tissue interface. Comparison Study: Previous RLEV exam on 08/27/2015 was negative for DVT Performing Technologist: Ernestene Mention RVT, RDMS  Examination Guidelines: A complete evaluation includes B-mode imaging, spectral Doppler, color Doppler, and power Doppler as needed of all accessible portions of each vessel. Bilateral testing is considered an integral part of a complete examination. Limited examinations for reoccurring indications may be performed as noted. The reflux portion of the exam is performed with the patient in reverse Trendelenburg.  +---------+---------------+---------+-----------+----------+-------------------+ RIGHT    CompressibilityPhasicitySpontaneityPropertiesThrombus Aging      +---------+---------------+---------+-----------+----------+-------------------+ CFV      Full           Yes      Yes                                      +---------+---------------+---------+-----------+----------+-------------------+ SFJ      Full                                                             +---------+---------------+---------+-----------+----------+-------------------+ FV Prox  Full           Yes      Yes                                      +---------+---------------+---------+-----------+----------+-------------------+ FV Mid   Full           Yes      Yes                                      +---------+---------------+---------+-----------+----------+-------------------+  FV DistalFull           Yes      Yes                                      +---------+---------------+---------+-----------+----------+-------------------+ PFV      Full                                                              +---------+---------------+---------+-----------+----------+-------------------+ POP      Full           Yes      Yes                                      +---------+---------------+---------+-----------+----------+-------------------+ PTV      Full                                                             +---------+---------------+---------+-----------+----------+-------------------+ PERO                                                  Not well visualized +---------+---------------+---------+-----------+----------+-------------------+   +----+---------------+---------+-----------+----------+--------------+ LEFTCompressibilityPhasicitySpontaneityPropertiesThrombus Aging +----+---------------+---------+-----------+----------+--------------+ CFV Full           Yes      Yes                                 +----+---------------+---------+-----------+----------+--------------+    Summary: RIGHT: - There is no evidence of deep vein thrombosis in the lower extremity.  - A large cystic structure is found in the popliteal fossa (6.1 x 1.2 x 2.7 cm).  LEFT: - No evidence of common femoral vein obstruction.  *See table(s) above for measurements and observations. Electronically signed by Coral Else MD on 11/10/2022 at 10:48:01 PM.    Final    CT ABDOMEN PELVIS WO CONTRAST  Result Date: 11/10/2022 CLINICAL DATA:  lower abd pain over prior renal transplant EXAM: CT ABDOMEN AND PELVIS WITHOUT CONTRAST TECHNIQUE: Multidetector CT imaging of the abdomen and pelvis was performed following the standard protocol without IV contrast. RADIATION DOSE REDUCTION: This exam was performed according to the departmental dose-optimization program which includes automated exposure control, adjustment of the mA and/or kV according to patient size and/or use of iterative reconstruction technique. COMPARISON:  CT abdomen pelvis 01/13/2022 FINDINGS: Lower  chest: No acute abnormality. Hepatobiliary: No focal liver abnormality. Status post cholecystectomy. No biliary dilatation. Pancreas: No focal lesion. Normal pancreatic contour. No surrounding inflammatory changes. No main pancreatic ductal dilatation. Spleen: Normal in size without focal abnormality. Adrenals/Urinary Tract: There is a able 2.5 x 2.7 cm left adrenal gland nodule with a density of 36 Hounsfield units. No right adrenal gland nodule. Bilateral native kidney atrophy status post right iliac fossa  renal transplant. No hydronephrosis. No hydroureter.  No nephroureterolithiasis. The urinary bladder is unremarkable. Stomach/Bowel: Stomach is within normal limits. No evidence of small bowel wall thickening or dilatation. Mild focal bowel wall thickening and pericolonic fat stranding along a distal descending colon/proximal sigmoid colon diverticula (8:74, 2:47). Colonic diverticulosis. Appendix appears normal. Vascular/Lymphatic: No abdominal aorta or iliac aneurysm. Moderate atherosclerotic plaque of the aorta and its branches. No abdominal, pelvic, or inguinal lymphadenopathy. Reproductive: Uterus and bilateral adnexa are unremarkable. Other: No intraperitoneal free fluid. No intraperitoneal free gas. No organized fluid collection. Musculoskeletal: No abdominal wall hernia or abnormality. No suspicious lytic or blastic osseous lesions. No acute displaced fracture. L4-L5 posterolateral and interbody surgical hardware fusion. IMPRESSION: 1. Colonic diverticulosis with uncomplicated early/mild distal descending colon/proximal sigmoid colon diverticulitis. 2. Atrophic native kidneys with a right iliac fossa renal transplant. 3. 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 4. Recommend adrenal washout CT or chemical shift MRI. JACR 2017 Aug; 14(8):1038-44, JCAT 2016 Mar-Apr; 40(2):194-200, Urol J 2006 Spring; 3(2):71-4. 5.  Aortic Atherosclerosis (ICD10-I70.0). 6. Limited  evaluation on this noncontrast study. Electronically Signed   By: Tish Frederickson M.D.   On: 11/10/2022 21:38   DG Chest Port 1 View  Result Date: 11/10/2022 CLINICAL DATA:  Syncope and chest pain EXAM: PORTABLE CHEST 1 VIEW COMPARISON:  X-ray 03/18/2022 FINDINGS: Calcified and tortuous aorta. Normal cardiopericardial silhouette. No consolidation, pneumothorax or effusion. No edema. Overlapping cardiac leads. Degenerative changes seen of the spine. IMPRESSION: No acute cardiopulmonary disease Electronically Signed   By: Karen Kays M.D.   On: 11/10/2022 17:43   US PELVIC COMPLETE WITH TRANSVAGINAL  Result Date: 10/19/2022 CLINICAL DATA:  Pelvic pain. 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 EXAM: TRANSABDOMINAL AND TRANSVAGINAL ULTRASOUND OF PELVIS TECHNIQUE: Both transabdominal and transvaginal ultrasound examinations of the pelvis were performed. Transabdominal technique was performed for global imaging of the pelvis including uterus, ovaries, adnexal regions, and pelvic cul-de-sac. It was necessary to proceed with endovaginal exam following the transabdominal exam to visualize the uterus and endometrial stripe in better detail. COMPARISON:  Abdomen and pelvis CT dated 01/13/2022. FINDINGS: Uterus Measurements: 6.0 x 3.9 x 3.1 cm = volume: 30 mL. No fibroids or other mass visualized. Endometrium Thickness: 1.5 mm. Small amount of fluid in the endometrial cavity. No visible mass. Right ovary Surgically absent. Left ovary Surgically absent. Other findings No abnormal free fluid. IMPRESSION: 1. Small amount fluid in the endometrial cavity with no endometrial thickening. This is a nonspecific finding. 2. Status post bilateral salpingo-oophorectomy. Electronically Signed   By: Beckie Salts M.D.   On: 10/19/2022 22:23     Subjective: - no chest pain, shortness of breath, no abdominal pain, nausea or vomiting.   Discharge Exam: BP (!) 145/77 (BP Location: Left Leg)    Pulse 66   Temp (!) 97.4 F (36.3 C) (Oral)   Resp 18   Ht 5' (1.524 m)   Wt 86.2 kg   SpO2 100%   BMI 37.11 kg/m   General: Pt is alert, awake, not in acute distress Cardiovascular: RRR, S1/S2 +, no rubs, no gallops Respiratory: CTA bilaterally, no wheezing, no rhonchi Abdominal: Soft, NT, ND, bowel sounds + Extremities: no edema, no cyanosis    The results of significant diagnostics from this hospitalization (including imaging, microbiology, ancillary and laboratory) are listed below for reference.     Microbiology: Recent Results (from the past 240 hour(s))  SARS Coronavirus 2 by RT PCR (  hospital order, performed in [facility] hospital lab) *cepheid single result test* Anterior Nasal Swab     Status: None   Collection Time: 11/10/22  6:01 PM   Specimen: Anterior Nasal Swab  Result Value Ref Range Status   SARS Coronavirus 2 by RT PCR NEGATIVE NEGATIVE Final    Comment: (NOTE) SARS-CoV-2 target nucleic acids are NOT DETECTED.  The SARS-CoV-2 RNA is generally detectable in upper and lower respiratory specimens during the acute phase of infection. The lowest concentration of SARS-CoV-2 viral copies this assay can detect is 250 copies / mL. A negative result does not preclude SARS-CoV-2 infection and should not be used as the sole basis for treatment or other patient management decisions.  A negative result may occur with improper specimen collection / handling, submission of specimen other than nasopharyngeal swab, presence of viral mutation(s) within the areas targeted by this assay, and inadequate number of viral copies (<250 copies / mL). A negative result must be combined with clinical observations, patient history, and epidemiological information.  Fact Sheet for Patients:   RoadLapTop.co.za  Fact Sheet for Healthcare Providers: http://kim-miller.com/  This test is not yet approved or  cleared by the [place]  FDA and has been authorized for detection and/or diagnosis of SARS-CoV-2 by FDA under an Emergency Use Authorization (EUA).  This EUA will remain in effect (meaning this test can be used) for the duration of the COVID-19 declaration under Section 564(b)(1) of the Act, 21 U.S.C. section 360bbb-3(b)(1), unless the authorization is terminated or revoked sooner.  Performed at [facility], [address] [place], [place] [address]      Labs: Basic Metabolic Panel: Recent Labs  Lab 11/10/22 1801 11/10/22 2005 11/11/22 0504  NA 140  --  136  K 4.5  --  3.6  CL 107  --  107  CO2 25  --  19*  GLUCOSE 194*  --  110*  BUN 29*  --  28*  CREATININE 2.00*  --  1.76*  CALCIUM 9.3  --  8.8*  MG  --  1.6* 1.9   Liver Function Tests: Recent Labs  Lab 11/10/22 1801 11/11/22 0504  AST 17 17  ALT 13 11  ALKPHOS 53 54  BILITOT 0.9 1.0  PROT 6.4* 6.6  ALBUMIN 3.3* 3.4*   CBC: Recent Labs  Lab 11/10/22 1801 11/11/22 0504 11/12/22 0814  WBC 10.7* 11.6* 6.4  NEUTROABS 7.9*  --   --   HGB 11.9* 12.2 11.4*  HCT 37.5 38.5 36.2  MCV 89.9 91.7 91.9  PLT 207 217 194   CBG: No results for input(s): "GLUCAP" in the last 168 hours. Hgb A1c No results for input(s): "HGBA1C" in the last 72 hours. Lipid Profile No results for input(s): "CHOL", "HDL", "LDLCALC", "TRIG", "CHOLHDL", "LDLDIRECT" in the last 72 hours. Thyroid function studies No results for input(s): "TSH", "T4TOTAL", "T3FREE", "THYROIDAB" in the last 72 hours.  Invalid input(s): "FREET3" Urinalysis    Component Value Date/Time   COLORURINE YELLOW 11/10/2022 2113   APPEARANCEUR HAZY (A) 11/10/2022 2113   LABSPEC 1.015 11/10/2022 2113   PHURINE 5.0 11/10/2022 2113   GLUCOSEU NEGATIVE 11/10/2022 2113   HGBUR NEGATIVE 11/10/2022 2113   BILIRUBINUR NEGATIVE 11/10/2022 2113   KETONESUR NEGATIVE 11/10/2022 2113   PROTEINUR 30 (A) 11/10/2022 2113   NITRITE NEGATIVE 11/10/2022 2113   LEUKOCYTESUR  NEGATIVE 11/10/2022 2113    FURTHER DISCHARGE INSTRUCTIONS:   Get Medicines reviewed and adjusted: Please take all your medications  with you for your next visit with your Primary MD   Laboratory/radiological data: Please request your Primary MD to go over all hospital tests and procedure/radiological results at the follow up, please ask your Primary MD to get all Hospital records sent to his/her office.   In some cases, they will be blood work, cultures and biopsy results pending at the time of your discharge. Please request that your primary care M.D. goes through all the records of your hospital data and follows up on these results.   Also Note the following: If you experience worsening of your admission symptoms, develop shortness of breath, life threatening emergency, suicidal or homicidal thoughts you must seek medical attention immediately by calling 911 or calling your MD immediately  if symptoms less severe.   You must read complete instructions/literature along with all the possible adverse reactions/side effects for all the Medicines you take and that have been prescribed to you. Take any new Medicines after you have completely understood and accpet all the possible adverse reactions/side effects.    Do not drive when taking Pain medications or sleeping medications (Benzodaizepines)   Do not take more than prescribed Pain, Sleep and Anxiety Medications. It is not advisable to combine anxiety,sleep and pain medications without talking with your primary care practitioner   Special Instructions: If you have smoked or chewed Tobacco  in the last 2 yrs please stop smoking, stop any regular Alcohol  and or any Recreational drug use.   Wear Seat belts while driving.   Please note: You were cared for by a hospitalist during your hospital stay. Once you are discharged, your primary care physician will handle any further medical issues. Please note that NO REFILLS for any discharge  medications will be authorized once you are discharged, as it is imperative that you return to your primary care physician (or establish a relationship with a primary care physician if you do not have one) for your post hospital discharge needs so that they can reassess your need for medications and monitor your lab values.  Time coordinating discharge: 35 minutes  SIGNED:  Pamella Pert, MD, PhD 11/12/2022, 9:43 AM

## 2022-11-12 NOTE — TOC Initial Note (Signed)
Transition of Care [facility]) - Initial/Assessment Note    Patient Details  Name: Megan Bradford MRN: [id number] Date of Birth: [birth year]  Transition of Care [facility]) CM/SW Contact:    Adrian Prows, RN Phone Number: 11/12/2022, 8:41 AM  Clinical Narrative:                 Spoke w/ pt in room; pt says she is from home and plans to return at d/c; she identieid POC dtr Ileene Musa [phone]); she denies IPV, difficulty paying utilities, and food/housing insecurity; she has transportation; pt has glasses and upper dentures; she does not have DME, HH services, or home oxygen; no TOC needs; please place consult if needed.  Expected Discharge Plan: Home/Self Care Barriers to Discharge: Continued Medical Work up   Patient Goals and CMS Choice Patient states their goals for this hospitalization and ongoing recovery are:: home          Expected Discharge Plan and Services   Discharge Planning Services: CM Consult   Living arrangements for the past 2 months: Apartment                                      Prior Living Arrangements/Services Living arrangements for the past 2 months: Apartment Lives with:: Self Patient language and need for interpreter reviewed:: Yes Do you feel safe going back to the place where you live?: Yes      Need for Family Participation in Patient Care: Yes (Comment) Care giver support system in place?: Yes (comment) Current home services:  (n/a) Criminal Activity/Legal Involvement Pertinent to Current Situation/Hospitalization: No - Comment as needed  Activities of Daily Living Home Assistive Devices/Equipment: None ADL Screening (condition at time of admission) Patient's cognitive ability adequate to safely complete daily activities?: Yes Is the patient deaf or have difficulty hearing?: No Does the patient have difficulty seeing, even when wearing glasses/contacts?: Yes Does the patient have difficulty concentrating, remembering, or  making decisions?: No Patient able to express need for assistance with ADLs?: No Does the patient have difficulty dressing or bathing?: No Independently performs ADLs?: Yes (appropriate for developmental age) Does the patient have difficulty walking or climbing stairs?: No Weakness of Legs: None Weakness of Arms/Hands: None  Permission Sought/Granted Permission sought to share information with : Case Manager Permission granted to share information with : Yes, Verbal Permission Granted  Share Information with NAME: Case Manager     Permission granted to share info w Relationship: Hazle Quant (dtr) [phone]     Emotional Assessment Appearance:: Appears stated age Attitude/Demeanor/Rapport: Gracious Affect (typically observed): Accepting Orientation: : Oriented to Self, Oriented to Place, Oriented to  Time, Oriented to Situation Alcohol / Substance Use: Not Applicable Psych Involvement: No (comment)  Admission diagnosis:  Syncope [R55] AKI (acute kidney injury) (HCC) [N17.9] Elevated d-dimer [R79.89] Syncope, unspecified syncope type [R55] Chest pain, unspecified type [R07.9] Patient Active Problem List   Diagnosis Date Noted   Abdominal pain 11/11/2022   Hypomagnesemia 11/11/2022   Allergic rhinitis 11/11/2022   Syncope 11/10/2022   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   Adrenal mass, 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   Diverticulosis 02/02/2022   Hospital discharge follow-up 10/20/2021   Dyslipidemia 10/20/2021   Chronic diastolic CHF (congestive heart failure) (HCC) 10/14/2021   Hyperlipidemia 10/14/2021   Diarrhea 10/14/2021   Dyssynergic defecation    Rectal sphincter incontinence    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/01/2020  Trigger finger, right middle finger 10/28/2020   Coronary artery disease involving native coronary artery of native heart 08/30/2020   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 [place] [address] Phone: [phone] Fax: [phone]     Social Determinants of Health (SDOH) Social History: SDOH Screenings   Food Insecurity: No Food Insecurity (11/12/2022)  Housing: Low Risk  (11/12/2022)  Transportation Needs: No Transportation Needs (11/12/2022)  Utilities: Not At Risk (11/12/2022)  Alcohol Screen: Low Risk  (11/02/2022)  Depression (PHQ2-9): Low Risk  (11/02/2022)  Financial Resource Strain: Low Risk  (11/02/2022)  Physical Activity: Insufficiently Active (11/02/2022)  Social Connections: Moderately Integrated (11/02/2022)  Stress: No Stress Concern Present (11/02/2022)  Tobacco Use: Low Risk  (11/11/2022)   SDOH Interventions: Food Insecurity Interventions: Inpatient TOC, Intervention Not Indicated Housing Interventions: Intervention Not Indicated, Inpatient TOC Transportation Interventions: Intervention Not Indicated, Inpatient TOC Utilities Interventions: Intervention Not Indicated, Inpatient TOC   Readmission Risk Interventions     No data to display

## 2022-11-12 NOTE — Discharge Instructions (Signed)
Follow with Georgina Quint, MD in 5-7 days  Please get a complete blood count and chemistry panel checked by your Primary MD at your next visit, and again as instructed by your Primary MD. Please get your medications reviewed and adjusted by your Primary MD.  Please request your Primary MD to go over all Hospital Tests and Procedure/Radiological results at the follow up, please get all Hospital records sent to your Prim MD by signing hospital release before you go home.  In some cases, there will be blood work, cultures and biopsy results pending at the time of your discharge. Please request that your primary care M.D. goes through all the records of your hospital data and follows up on these results.  If you had Pneumonia of Lung problems at the Hospital: Please get a 2 view Chest X ray done in 6-8 weeks after hospital discharge or sooner if instructed by your Primary MD.  If you have Congestive Heart Failure: Please call your Cardiologist or Primary MD anytime you have any of the following symptoms:  1) 3 pound weight gain in 24 hours or 5 pounds in 1 week  2) shortness of breath, with or without a dry hacking cough  3) swelling in the hands, feet or stomach  4) if you have to sleep on extra pillows at night in order to breathe  Follow cardiac low salt diet and 1.5 lit/day fluid restriction.  If you have diabetes Accuchecks 4 times/day, Once in AM empty stomach and then before each meal. Log in all results and show them to your primary doctor at your next visit. If any glucose reading is under 80 or above 300 call your primary MD immediately.  If you have Seizure/Convulsions/Epilepsy: Please do not drive, operate heavy machinery, participate in activities at heights or participate in high speed sports until you have seen by Primary MD or a Neurologist and advised to do so again. Per [facility] statutes, patients with seizures are not allowed to drive until they have been  seizure-free for six months.  Use caution when using heavy equipment or power tools. Avoid working on ladders or at heights. Take showers instead of baths. Ensure the water temperature is not too high on the home water heater. Do not go swimming alone. Do not lock yourself in a room alone (i.e. bathroom). When caring for infants or small children, sit down when holding, feeding, or changing them to minimize risk of injury to the child in the event you have a seizure. Maintain good sleep hygiene. Avoid alcohol.   If you had Gastrointestinal Bleeding: Please ask your Primary MD to check a complete blood count within one week of discharge or at your next visit. Your endoscopic/colonoscopic biopsies that are pending at the time of discharge, will also need to followed by your Primary MD.  Get Medicines reviewed and adjusted. Please take all your medications with you for your next visit with your Primary MD  Please request your Primary MD to go over all hospital tests and procedure/radiological results at the follow up, please ask your Primary MD to get all Hospital records sent to his/her office.  If you experience worsening of your admission symptoms, develop shortness of breath, life threatening emergency, suicidal or homicidal thoughts you must seek medical attention immediately by calling 911 or calling your MD immediately  if symptoms less severe.  You must read complete instructions/literature along with all the possible adverse reactions/side effects for all the Medicines you  take and that have been prescribed to you. Take any new Medicines after you have completely understood and accpet all the possible adverse reactions/side effects.   Do not drive or operate heavy machinery when taking Pain medications.   Do not take more than prescribed Pain, Sleep and Anxiety Medications  Special Instructions: If you have smoked or chewed Tobacco  in the last 2 yrs please stop smoking, stop any regular  Alcohol  and or any Recreational drug use.  Wear Seat belts while driving.  Please note You were cared for by a hospitalist during your hospital stay. If you have any questions about your discharge medications or the care you received while you were in the hospital after you are discharged, you can call the unit and asked to speak with the hospitalist on call if the hospitalist that took care of you is not available. Once you are discharged, your primary care physician will handle any further medical issues. Please note that NO REFILLS for any discharge medications will be authorized once you are discharged, as it is imperative that you return to your primary care physician (or establish a relationship with a primary care physician if you do not have one) for your aftercare needs so that they can reassess your need for medications and monitor your lab values.  You can reach the hospitalist office at phone [phone] or fax [phone]   If you do not have a primary care physician, you can call [phone] for a physician referral.  Activity: As tolerated with Full fall precautions use walker/cane & assistance as needed    Diet: regular  Disposition Home

## 2022-11-13 ENCOUNTER — Telehealth: Payer: Self-pay

## 2022-11-13 NOTE — Transitions of Care (Post Inpatient/ED Visit) (Unsigned)
   11/13/2022  Name: Megan Bradford MRN: [id number] DOB: 1950-07-12  Today's TOC FU Call Status: Today's TOC FU Call Status:: Unsuccessul Call (1st Attempt) Unsuccessful Call (1st Attempt) Date: 11/13/22  Attempted to reach the patient regarding the most recent Inpatient/ED visit.  Follow Up Plan: Additional outreach attempts will be made to reach the patient to complete the Transitions of Care (Post Inpatient/ED visit) call.   Signature  Woodfin Ganja LPN [facility] Nurse Health Advisor Direct Dial [phone]

## 2022-11-14 NOTE — Transitions of Care (Post Inpatient/ED Visit) (Unsigned)
   11/14/2022  Name: Megan Bradford MRN: [id number] DOB: [birth year]  Today's TOC FU Call Status: Today's TOC FU Call Status:: Unsuccessful Call (2nd Attempt) Unsuccessful Call (1st Attempt) Date: 11/13/22 Unsuccessful Call (2nd Attempt) Date: 11/14/22  Attempted to reach the patient regarding the most recent Inpatient/ED visit.  Follow Up Plan: Additional outreach attempts will be made to reach the patient to complete the Transitions of Care (Post Inpatient/ED visit) call.   Signature   Woodfin Ganja LPN [facility] Nurse Health Advisor Direct Dial [phone]

## 2022-11-15 ENCOUNTER — Inpatient Hospital Stay: Payer: 59 | Admitting: Emergency Medicine

## 2022-11-15 NOTE — Transitions of Care (Post Inpatient/ED Visit) (Signed)
   11/15/2022  Name: Megan Bradford MRN: [id number] DOB: Sep 28, 1950  Today's TOC FU Call Status: Today's TOC FU Call Status:: Successful TOC FU Call Competed Unsuccessful Call (1st Attempt) Date: 11/13/22 Unsuccessful Call (2nd Attempt) Date: 11/14/22 Unsuccessful Call (3rd Attempt) Date: 11/15/22 [facility] FU Call Complete Date: 11/15/22  Attempted to reach the patient regarding the most recent Inpatient/ED visit.  Follow Up Plan: No further outreach attempts will be made at this time. We have been unable to contact the patient. Pt has fu appt 11-15-22 at 920am  Signature  Woodfin Ganja LPN [facility] Nurse Health Advisor Direct Dial [phone]

## 2022-11-22 ENCOUNTER — Encounter: Payer: Self-pay | Admitting: Emergency Medicine

## 2022-11-22 ENCOUNTER — Ambulatory Visit (INDEPENDENT_AMBULATORY_CARE_PROVIDER_SITE_OTHER): Payer: 59 | Admitting: Emergency Medicine

## 2022-11-22 VITALS — BP 128/70 | HR 65 | Temp 98.4°F | Ht 60.0 in | Wt 187.0 lb

## 2022-11-22 DIAGNOSIS — I2510 Atherosclerotic heart disease of native coronary artery without angina pectoris: Secondary | ICD-10-CM

## 2022-11-22 DIAGNOSIS — Z940 Kidney transplant status: Secondary | ICD-10-CM

## 2022-11-22 DIAGNOSIS — N1832 Chronic kidney disease, stage 3b: Secondary | ICD-10-CM

## 2022-11-22 DIAGNOSIS — E785 Hyperlipidemia, unspecified: Secondary | ICD-10-CM

## 2022-11-22 DIAGNOSIS — I10 Essential (primary) hypertension: Secondary | ICD-10-CM | POA: Diagnosis not present

## 2022-11-22 DIAGNOSIS — Z09 Encounter for follow-up examination after completed treatment for conditions other than malignant neoplasm: Secondary | ICD-10-CM

## 2022-11-22 NOTE — Assessment & Plan Note (Signed)
BP Readings from Last 3 Encounters:  11/22/22 128/70  11/12/22 (!) 145/77  10/06/22 132/75  Well-controlled hypertension Continue amlodipine 5 mg daily, lisinopril 5 mg daily

## 2022-11-22 NOTE — Progress Notes (Signed)
Megan Bradford 72 y.o.   Chief Complaint  Patient presents with   Hospitalization Follow-up    Patient was d/c on 7/3 , chest pain, thought patient had blood clot,     HISTORY OF PRESENT ILLNESS: This is a 72 y.o. female here for hospital discharge follow-up Admitted on 11/10/2022 and released on 11/12/2022 when she presented with syncopal episode Doing much better. Today has no complaints or medical concerns Discharge summary as follows: Physician Discharge Summary  Kelie Gainey WGN:[id number] DOB: [birth year] DOA: 11/10/2022   PCP: Georgina Quint, MD   Admit date: 11/10/2022 Discharge date: 11/12/2022   Admitted From: home Disposition:  home   Recommendations for Outpatient Follow-up:  Follow up with PCP in 1-2 weeks   Home Health: none Equipment/Devices: none   Discharge Condition: stable CODE STATUS: Full code Diet Orders (From admission, onward)        Start     Ordered    11/11/22 0519   Diet regular Room service appropriate? Yes; Fluid consistency: Thin  Diet effective now       Question Answer Comment  Room service appropriate? Yes    Fluid consistency: Thin       11/11/22 0518                  HPI: Per admitting MD, Megan Bradford is a 72 y.o. 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 The patient reports a single episode of loss of consciousness that occurred earlier in the day.  She notes that she had just finished eating lunch at [employer] and had just stood up to ambulate to a store to do some shopping with her granddaughter, when she became dizzy, lightheaded and experienced a subjective sensation of impending loss of consciousness as well as nausea/vomiting,  before formally losing consciousness.  The patient's granddaughter was at her side, and prevented her from falling.  No trauma.  Granddaughter conveyed that the patient remained unconscious for less than 1 minute, before fully regaining consciousness in the absence of any corresponding confusion.  This was not associate with any tonic-clonic activity nor any tongue biting or loss of bowel/bladder function.  Patient notes that she had a similar episode of syncope 1 to 2 years ago. She has a history of pulmonary embolism 10 years ago as well as existing diagnosis of stage I pelvic cancer.  She reports that she has been experiencing some crampy generalized abdominal discomfort over the course of the last few days.  She also reports some pleuritic left-sided chest discomfort over the last few days.  Not associate any shortness of breath or hemoptysis.  She notes some preceding right calf tenderness that is subsequently resolved.  Denies any residual calf tenderness or any other new lower extremity erythema.  Denies any exertional chest pain. History of renal cell transplant 8 years ago performed at [facility] in [place]. No recent acute focal weakness or any acute focal numbness, paresthesias, vertigo, dysarthria, facial droop, or acute change in vision.   Hospital Course / Discharge diagnoses: Principal Problem:   Syncope Active Problems:   CKD stage 3b, GFR 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)   Essential hypertension   Hyperlipidemia   Abdominal pain   Hypomagnesemia   Allergic rhinitis  Principal problem Syncope, few days of pleuritic type chest pain -her pleuritic type chest pain associated with elevated D-dimer was concerning for a PE.  Given history of renal transplantation chronic kidney disease, CT angiogram could not be obtained.  She underwent a VQ scan on 6/29 which showed a moderate-sized area of decreased tracer activity in the posterior left upper lung field, without  wedge shaped perfusion defects, and the study was inconclusive for evaluation for PE.  Discussed extensively with the patient and the daughter, that with the study being inconclusive we cannot for certain say that she has or does not have a PE.  Risks and benefits were discussed about her going on anticoagulation and not having a PE, but also about her not going on anticoagulation and indeed having a PE, discussed various significant outcomes including death.  She expressed understanding, and at this point she elects NOT to initiate anticoagulation.  Underwent a 2D echocardiogram as well which was fairly unremarkable with LVEF 65 to 65%, no WMA, grade 1 diastolic dysfunction.  Of note, RV was normal.  Other possible etiologies for her syncope include vasovagal, hypotension, but no clear apparent cause.  Telemetry was without significant arrhythmias   Active problems Abdominal discomfort -some on and off cramps over the last few days.  CT scan of the abdomen pelvis showed colonic diverticulosis with uncomplicated early sigmoid colon diverticulitis.  Has been placed on Augmentin, continue upon discharge for total of 10 days.  Abdominal pain improved Hypertension -continue home medications  Hypomagnesemia -magnesium replaced Hyperlipidemia -on statin History of renal transplant, CKD 3B -status post renal transplant 8 years ago.  Currently creatinine is at baseline. 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  Further management as an outpatient, defer to PCP / also seeing GynOnc   Sepsis ruled out    HPI   Prior to Admission medications   Medication Sig Start Date End Date Taking? Authorizing Provider  amLODipine (NORVASC) 5 MG tablet Take 5 mg by mouth daily.   Yes [provider]  aspirin EC 81 MG tablet Take 81 mg by mouth daily. Swallow whole.   Yes [provider]  carvedilol (COREG) 12.5 MG tablet Take 12.5 mg by mouth 2 (two) times  daily with a meal.   Yes [provider]  cetirizine (ZYRTEC) 10 MG tablet Take 10 mg by mouth daily.   Yes [provider]  Cholecalciferol (VITAMIN D3) 10 MCG (400 UNIT) CAPS Take 400 Units by mouth daily.   Yes [provider]  Coenzyme Q10 (CO Q10 PO) Take 1 capsule by mouth daily.   Yes [provider]  diclofenac Sodium (VOLTAREN) 1 % GEL Apply 2 g topically 4 (four) times daily. Patient taking differently: Apply 2 g topically daily as needed (pain). 12/01/19  Yes Wieters, Hallie C, [name]  diphenhydramine-acetaminophen (TYLENOL PM) 25-500 MG TABS tablet Take 1 tablet by mouth at bedtime as needed (As needed).   Yes [provider]  lisinopril (ZESTRIL) 5 MG tablet Take 5 mg by mouth daily. 03/26/21  Yes [provider]  LORazepam (ATIVAN) 2 MG tablet Take 2 mg by mouth 2 (two) times daily as needed. 01/10/22  Yes [provider]  magnesium oxide (MAG-OX) 400 MG tablet Take 400 mg by mouth daily.   Yes [provider]  mycophenolate (MYFORTIC) 180 MG EC tablet Take 180 mg by mouth 2 (two) times daily.   Yes [provider]  rosuvastatin (CRESTOR) 20 MG  tablet Take 1 tablet (20 mg total) by mouth daily. 10/20/22  Yes Pemberton, Kathlynn Grate, MD  tacrolimus (PROGRAF) 1 MG capsule TAKE 4 CAPSULE BY MOUTH EVERY MORNING AND 2 CAPSULE BY MOUTH EVERY EVENING Patient taking differently: Take 4 mg by mouth 2 (two) times daily. 4 capsules every morning, and 2 capsules every evening. 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   Adrenal mass, 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   Diverticulosis 02/02/2022   Hospital discharge follow-up 10/20/2021   Dyslipidemia 10/20/2021    Chronic diastolic CHF (congestive heart failure) (HCC) 10/14/2021   Hyperlipidemia 10/14/2021   Diarrhea 10/14/2021   Dyssynergic defecation    Rectal sphincter incontinence    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/01/2020   Trigger finger, right middle finger 10/28/2020   Coronary artery disease involving native coronary artery of native heart 08/30/2020   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/30/2020   CKD stage 3b, GFR 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) 12/17/2019   History of kidney transplant 11/27/2019   Essential hypertension 11/27/2019   Prediabetes 11/27/2019   Obesity (BMI 30-39.9) 11/27/2019    Past Medical History:  Diagnosis Date   Cataract    Phreesia 11/24/2019- removed both   Chronic kidney disease    Phreesia 11/24/2019   Clotting disorder (HCC)    PE 2010 per pt there really was not a PE   Fistula    LUE; was on dialysis for 11 months   Heart murmur    Hypertension    Phreesia 11/24/2019   Kidney transplanted    transplant team [facility] [place])   Pre-diabetes    no meds   Pulmonary embolism (HCC) 2010   never symptomatic    Past Surgical History:  Procedure Laterality Date   ANAL RECTAL MANOMETRY N/A 08/03/2021   Procedure: ANO RECTAL MANOMETRY;  Surgeon: Tressia Danas, MD;  Location: [facility];  Service: Gastroenterology;  Laterality: N/A;   CARDIAC CATHETERIZATION     x2   CESAREAN SECTION N/A    Phreesia 11/24/2019   CHOLECYSTECTOMY N/A    Phreesia 11/24/2019   COLONOSCOPY     DILATATION & CURETTAGE/HYSTEROSCOPY WITH MYOSURE N/A 10/28/2021   Procedure: DILATATION & CURETTAGE/HYSTEROSCOPY;  Surgeon: Hoover Browns, MD;  Location: [facility];  Service: Gynecology;  Laterality: N/A;   DILATION AND CURETTAGE OF UTERUS     EYE SURGERY N/A    Cataracts removed Phreesia 11/24/2019   HIP SURGERY Left 1990   Hip dysplasia cleaned out per patient   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   LAPAROSCOPIC SALPINGO OOPHERECTOMY Bilateral 10/28/2021   Procedure:  LAPAROSCOPIC SALPINGO OOPHORECTOMY WITH PELVIC WASHINGS;  Surgeon: Hoover Browns, MD;  Location: [facility];  Service: Gynecology;  Laterality: Bilateral;   POLYPECTOMY     HPP   TONSILLECTOMY     TRANSFORAMINAL LUMBAR INTERBODY FUSION (TLIF) WITH PEDICLE SCREW FIXATION 1 LEVEL N/A 12/01/2020   Procedure: Transiforaminal Lumbar Interbody Fusion Lumbar four--Lumbar five;  Surgeon: Bedelia Person, MD;  Location: [facility];  Service: Neurosurgery;  Laterality: N/A;   TRIGGER FINGER RELEASE Right 10/28/2020   Long finger   VULVAR LESION REMOVAL N/A 10/28/2021   Procedure: COMPLETE RESECTION OF VULVAR WARTS;  Surgeon: Hoover Browns, MD;  Location:  [facility];  Service: Gynecology;  Laterality: N/A;    Social History   Socioeconomic History   Marital status: Widowed    Spouse name: Not on file   Number of children: Not on file   Years of education: Not on file   Highest education level: Not on file  Occupational History   Not on file  Tobacco Use   Smoking status: Never   Smokeless tobacco: Never  Vaping Use   Vaping Use: Never used  Substance and Sexual Activity   Alcohol use: Never   Drug use: Never   Sexual activity: Not Currently    Birth control/protection: None  Other Topics Concern   Not on file  Social History Narrative   Not on file   Social Determinants of Health   Financial Resource Strain: Low Risk  (11/02/2022)   Overall Financial Resource Strain (CARDIA)    Difficulty of Paying Living Expenses: Not hard at all  Food Insecurity: No Food Insecurity (11/12/2022)   Hunger Vital Sign    Worried About Running Out of Food in the Last Year: Never true    Ran Out of Food in the Last Year: Never true  Transportation Needs: No Transportation Needs (11/12/2022)   PRAPARE - Administrator, Civil Service (Medical): No    Lack of Transportation (Non-Medical): No  Physical Activity: Insufficiently Active (11/02/2022)   Exercise Vital Sign     Days of Exercise per Week: 3 days    Minutes of Exercise per Session: 30 min  Stress: No Stress Concern Present (11/02/2022)   [employer] of Occupational Health - Occupational Stress Questionnaire    Feeling of Stress : Not at all  Social Connections: Moderately Integrated (11/02/2022)   Social Connection and Isolation Panel [NHANES]    Frequency of Communication with Friends and Family: More than three times a week    Frequency of Social Gatherings with Friends and Family: More than three times a week    Attends Religious Services: More than 4 times per year    Active Member of [employer] or Organizations: Yes    Attends Banker Meetings: More than 4 times per year    Marital Status: Widowed  Intimate Partner Violence: Not At Risk (11/12/2022)   Humiliation, Afraid, Rape, and Kick questionnaire    Fear of Current or Ex-Partner: No    Emotionally Abused: No    Physically Abused: No    Sexually Abused: No    Family History  Problem Relation Age of Onset   Diabetes Mother    Asthma Mother    Hypertension Father    Coronary artery disease Father    Colon cancer Neg Hx    Colon polyps Neg Hx    Esophageal cancer Neg Hx    Rectal cancer Neg Hx    Stomach cancer Neg Hx    Breast cancer Neg Hx    Ovarian cancer Neg Hx    Endometrial cancer Neg Hx    Pancreatic cancer Neg Hx    Prostate cancer Neg Hx      Review of Systems  Constitutional: Negative.  Negative for chills and fever.  HENT: Negative.  Negative for congestion and sore throat.   Respiratory: Negative.  Negative for cough and shortness of breath.   Cardiovascular: Negative.  Negative for chest pain and palpitations.  Gastrointestinal:  Negative for abdominal pain, diarrhea, nausea and vomiting.  Genitourinary: Negative.  Negative for dysuria and hematuria.  Skin: Negative.  Negative for rash.  Neurological: Negative.  Negative for dizziness and headaches.  All other systems reviewed and are  negative.   Vitals:   11/22/22 0953  BP: 128/70  Pulse: 65  Temp: 98.4 F (36.9 C)  SpO2: 98%    Physical Exam Vitals reviewed.  Constitutional:      Appearance: Normal appearance.  HENT:     Head: Normocephalic.     Right Ear: Tympanic membrane, ear canal and external ear normal.     Left Ear: Tympanic membrane, ear canal and external ear normal.     Mouth/Throat:     Mouth: Mucous membranes are moist.     Pharynx: Oropharynx is clear.  Eyes:     Extraocular Movements: Extraocular movements intact.     Conjunctiva/sclera: Conjunctivae normal.     Pupils: Pupils are equal, round, and reactive to light.  Cardiovascular:     Rate and Rhythm: Normal rate and regular rhythm.     Pulses: Normal pulses.     Heart sounds: Normal heart sounds.  Pulmonary:     Effort: Pulmonary effort is normal.     Breath sounds: Normal breath sounds.  Abdominal:     Palpations: Abdomen is soft.     Tenderness: There is no abdominal tenderness.  Musculoskeletal:     Cervical back: No tenderness.  Lymphadenopathy:     Cervical: No cervical adenopathy.  Skin:    General: Skin is warm and dry.     Capillary Refill: Capillary refill takes less than 2 seconds.  Neurological:     General: No focal deficit present.     Mental Status: She is alert and oriented to person, place, and time.  Psychiatric:        Mood and Affect: Mood normal.        Behavior: Behavior normal.      ASSESSMENT & PLAN: A total of 43 minutes was spent with the patient and counseling/coordination of care regarding preparing for this visit, review of most recent office visit notes, review of most recent hospital discharge summary notes, review of multiple chronic medical conditions under management, review of all medications, review of most recent blood work results, education on nutrition, prognosis, documentation, need for follow-up.  Problem List Items Addressed This Visit       Cardiovascular and Mediastinum    Essential hypertension    BP Readings from Last 3 Encounters:  11/22/22 128/70  11/12/22 (!) 145/77  10/06/22 132/75  Well-controlled hypertension Continue amlodipine 5 mg daily, lisinopril 5 mg daily       Coronary artery disease involving native coronary artery of native heart    Stable without recent anginal episodes Continues daily baby aspirin and carvedilol 12.5 mg twice a day        Genitourinary   CKD stage 3b, GFR 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)    Advised to stay well-hydrated and avoid NSAIDs Continue lisinopril 5 mg daily        Other   History of kidney transplant    Continues to follow-up with nephrologist Continues Myfortic 180 mg twice a day and tacrolimus 4 mg twice a day      Hospital discharge follow-up - Primary   Dyslipidemia    Chronic stable condition Continues rosuvastatin 20 mg daily      Patient Instructions  Health Maintenance After Age 15 After age 39, you are at a higher risk for certain long-term diseases and infections as well as injuries from falls. Falls are a major  cause of broken bones and head injuries in people who are older than age 58. Getting regular preventive care can help to keep you healthy and well. Preventive care includes getting regular testing and making lifestyle changes as recommended by your health care provider. Talk with your health care provider about: Which screenings and tests you should have. A screening is a test that checks for a disease when you have no symptoms. A diet and exercise plan that is right for you. What should I know about screenings and tests to prevent falls? Screening and testing are the best ways to find a health problem early. Early diagnosis and treatment give you the best chance of managing medical conditions that are common after age 53. Certain conditions and lifestyle choices may make you more likely to have a fall. Your health care provider may recommend: Regular vision checks. Poor vision and conditions  such as cataracts can make you more likely to have a fall. If you wear glasses, make sure to get your prescription updated if your vision changes. Medicine review. Work with your health care provider to regularly review all of the medicines you are taking, including over-the-counter medicines. Ask your health care provider about any side effects that may make you more likely to have a fall. Tell your health care provider if any medicines that you take make you feel dizzy or sleepy. Strength and balance checks. Your health care provider may recommend certain tests to check your strength and balance while standing, walking, or changing positions. Foot health exam. Foot pain and numbness, as well as not wearing proper footwear, can make you more likely to have a fall. Screenings, including: Osteoporosis screening. Osteoporosis is a condition that causes the bones to get weaker and break more easily. Blood pressure screening. Blood pressure changes and medicines to control blood pressure can make you feel dizzy. Depression screening. You may be more likely to have a fall if you have a fear of falling, feel depressed, or feel unable to do activities that you used to do. Alcohol use screening. Using too much alcohol can affect your balance and may make you more likely to have a fall. Follow these instructions at home: Lifestyle Do not drink alcohol if: Your health care provider tells you not to drink. If you drink alcohol: Limit how much you have to: 0-1 drink a day for women. 0-2 drinks a day for men. Know how much alcohol is in your drink. In the [place] one drink equals one 12 oz bottle of beer (355 mL), one 5 oz glass of wine (148 mL), or one 1 oz glass of hard liquor (44 mL). Do not use any products that contain nicotine or tobacco. These products include cigarettes, chewing tobacco, and vaping devices, such as e-cigarettes. If you need help quitting, ask your health care  provider. Activity  Follow a regular exercise program to stay fit. This will help you maintain your balance. Ask your health care provider what types of exercise are appropriate for you. If you need a cane or walker, use it as recommended by your health care provider. Wear supportive shoes that have nonskid soles. Safety  Remove any tripping hazards, such as rugs, cords, and clutter. Install safety equipment such as grab bars in bathrooms and safety rails on stairs. Keep rooms and walkways well-lit. General instructions Talk with your health care provider about your risks for falling. Tell your health care provider if: You fall. Be sure to tell your health care  provider about all falls, even ones that seem minor. You feel dizzy, tiredness (fatigue), or off-balance. Take over-the-counter and prescription medicines only as told by your health care provider. These include supplements. Eat a healthy diet and maintain a healthy weight. A healthy diet includes low-fat dairy products, low-fat (lean) meats, and fiber from whole grains, beans, and lots of fruits and vegetables. Stay current with your vaccines. Schedule regular health, dental, and eye exams. Summary Having a healthy lifestyle and getting preventive care can help to protect your health and wellness after age 53. Screening and testing are the best way to find a health problem early and help you avoid having a fall. Early diagnosis and treatment give you the best chance for managing medical conditions that are more common for people who are older than age 35. Falls are a major cause of broken bones and head injuries in people who are older than age 69. Take precautions to prevent a fall at home. Work with your health care provider to learn what changes you can make to improve your health and wellness and to prevent falls. This information is not intended to replace advice given to you by your health care provider. Make sure you discuss  any questions you have with your health care provider. Document Revised: 09/20/2020 Document Reviewed: 09/20/2020 Elsevier Patient Education  2024 Elsevier Inc.    Edwina Barth, MD [facility]

## 2022-11-22 NOTE — Assessment & Plan Note (Signed)
Chronic stable condition Continues rosuvastatin 20 mg daily

## 2022-11-22 NOTE — Patient Instructions (Signed)
Health Maintenance After Age 71 After age 71, you are at a higher risk for certain long-term diseases and infections as well as injuries from falls. Falls are a major cause of broken bones and head injuries in people who are older than age 71. Getting regular preventive care can help to keep you healthy and well. Preventive care includes getting regular testing and making lifestyle changes as recommended by your health care provider. Talk with your health care provider about: Which screenings and tests you should have. A screening is a test that checks for a disease when you have no symptoms. A diet and exercise plan that is right for you. What should I know about screenings and tests to prevent falls? Screening and testing are the best ways to find a health problem early. Early diagnosis and treatment give you the best chance of managing medical conditions that are common after age 71. Certain conditions and lifestyle choices may make you more likely to have a fall. Your health care provider may recommend: Regular vision checks. Poor vision and conditions such as cataracts can make you more likely to have a fall. If you wear glasses, make sure to get your prescription updated if your vision changes. Medicine review. Work with your health care provider to regularly review all of the medicines you are taking, including over-the-counter medicines. Ask your health care provider about any side effects that may make you more likely to have a fall. Tell your health care provider if any medicines that you take make you feel dizzy or sleepy. Strength and balance checks. Your health care provider may recommend certain tests to check your strength and balance while standing, walking, or changing positions. Foot health exam. Foot pain and numbness, as well as not wearing proper footwear, can make you more likely to have a fall. Screenings, including: Osteoporosis screening. Osteoporosis is a condition that causes  the bones to get weaker and break more easily. Blood pressure screening. Blood pressure changes and medicines to control blood pressure can make you feel dizzy. Depression screening. You may be more likely to have a fall if you have a fear of falling, feel depressed, or feel unable to do activities that you used to do. Alcohol use screening. Using too much alcohol can affect your balance and may make you more likely to have a fall. Follow these instructions at home: Lifestyle Do not drink alcohol if: Your health care provider tells you not to drink. If you drink alcohol: Limit how much you have to: 0-1 drink a day for women. 0-2 drinks a day for men. Know how much alcohol is in your drink. In the [place] one drink equals one 12 oz bottle of beer (355 mL), one 5 oz glass of wine (148 mL), or one 1 oz glass of hard liquor (44 mL). Do not use any products that contain nicotine or tobacco. These products include cigarettes, chewing tobacco, and vaping devices, such as e-cigarettes. If you need help quitting, ask your health care provider. Activity  Follow a regular exercise program to stay fit. This will help you maintain your balance. Ask your health care provider what types of exercise are appropriate for you. If you need a cane or walker, use it as recommended by your health care provider. Wear supportive shoes that have nonskid soles. Safety  Remove any tripping hazards, such as rugs, cords, and clutter. Install safety equipment such as grab bars in bathrooms and safety rails on stairs. Keep rooms and walkways   well-lit. General instructions Talk with your health care provider about your risks for falling. Tell your health care provider if: You fall. Be sure to tell your health care provider about all falls, even ones that seem minor. You feel dizzy, tiredness (fatigue), or off-balance. Take over-the-counter and prescription medicines only as told by your health care provider. These include  supplements. Eat a healthy diet and maintain a healthy weight. A healthy diet includes low-fat dairy products, low-fat (lean) meats, and fiber from whole grains, beans, and lots of fruits and vegetables. Stay current with your vaccines. Schedule regular health, dental, and eye exams. Summary Having a healthy lifestyle and getting preventive care can help to protect your health and wellness after age 71. Screening and testing are the best way to find a health problem early and help you avoid having a fall. Early diagnosis and treatment give you the best chance for managing medical conditions that are more common for people who are older than age 71. Falls are a major cause of broken bones and head injuries in people who are older than age 71. Take precautions to prevent a fall at home. Work with your health care provider to learn what changes you can make to improve your health and wellness and to prevent falls. This information is not intended to replace advice given to you by your health care provider. Make sure you discuss any questions you have with your health care provider. Document Revised: 09/20/2020 Document Reviewed: 09/20/2020 Elsevier Patient Education  2024 Elsevier Inc.  

## 2022-11-22 NOTE — Assessment & Plan Note (Signed)
Advised to stay well-hydrated and avoid NSAIDs Continue lisinopril 5 mg daily

## 2022-11-22 NOTE — Assessment & Plan Note (Signed)
Stable without recent anginal episodes Continues daily baby aspirin and carvedilol 12.5 mg twice a day

## 2022-11-22 NOTE — Assessment & Plan Note (Signed)
Continues to follow-up with nephrologist Continues Myfortic 180 mg twice a day and tacrolimus 4 mg twice a day

## 2022-11-28 ENCOUNTER — Telehealth: Payer: Self-pay | Admitting: *Deleted

## 2022-11-28 NOTE — Telephone Encounter (Signed)
Spoke with Megan Bradford who returned the call from office. Patient states she is doing well only complaining of mild cramping.   Patient states recently was hospitalized overnight for complaints of nausea and almost passed out. Pt states they ran some test and thought she might have a blood clot in her lungs so they put her on blood thinners and then the next day discharged her and test results were negative for blood clot. She stopped blood thinners. Pt also states she had a CT scan that showed diverticulitis and they put her on Augmentin and she just completed the 10 day course. She followed up with her PCP on 7/10. Denies abdominal pain, fever and chills. Pt thanked the office for calling and had no further concerns or questions at this time.

## 2022-11-28 NOTE — Telephone Encounter (Signed)
Attempted to reach Ms. Fonte to follow up on her symptoms. Left a voicemail requesting a call back to [phone].

## 2022-12-12 ENCOUNTER — Ambulatory Visit: Payer: 59 | Admitting: Emergency Medicine

## 2022-12-14 NOTE — Progress Notes (Signed)
Office Visit    Patient Name: Megan Bradford Date of Encounter: 12/20/2022  PCP:  Georgina Quint, MD   [facility]  Cardiologist:  Meriam Sprague, MD  New to me  Advanced Practice Provider:  No care team member to display Electrophysiologist:  None      Chief Complaint    Megan Bradford is a 72 y.o. female with a hx of CAD, ESRD s/p renal transplant 2016 with CKD 3B, hyperlipidemia, hypertension, palpitations, SVT Last seen by PA April 2023  to clear for laparoscopic hysterectomy This was done 10/28/21 with no cardiac issues   Past Medical History    Past Medical History:  Diagnosis Date   Cataract    Phreesia 11/24/2019- removed both   Chronic kidney disease    Phreesia 11/24/2019   Clotting disorder (HCC)    PE 2010 per pt there really was not a PE   Fistula    LUE; was on dialysis for 11 months   Heart murmur    Hypertension    Phreesia 11/24/2019   Kidney transplanted    transplant team [facility] [place])   Pre-diabetes    no meds   Pulmonary embolism (HCC) 2010   never symptomatic   Past Surgical History:  Procedure Laterality Date   ANAL RECTAL MANOMETRY N/A 08/03/2021   Procedure: ANO RECTAL MANOMETRY;  Surgeon: Tressia Danas, MD;  Location: [facility];  Service: Gastroenterology;  Laterality: N/A;   CARDIAC CATHETERIZATION     x2   CESAREAN SECTION N/A    Phreesia 11/24/2019   CHOLECYSTECTOMY N/A    Phreesia 11/24/2019   COLONOSCOPY     DILATATION & CURETTAGE/HYSTEROSCOPY WITH MYOSURE N/A 10/28/2021   Procedure: DILATATION & CURETTAGE/HYSTEROSCOPY;  Surgeon: Hoover Browns, MD;  Location: [facility];  Service: Gynecology;  Laterality: N/A;   DILATION AND CURETTAGE OF UTERUS     EYE SURGERY N/A    Cataracts removed Phreesia 11/24/2019   HIP SURGERY Left 1990   Hip dysplasia cleaned out per patient   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   LAPAROSCOPIC SALPINGO OOPHERECTOMY Bilateral 10/28/2021    Procedure: LAPAROSCOPIC SALPINGO OOPHORECTOMY WITH PELVIC WASHINGS;  Surgeon: Hoover Browns, MD;  Location: [facility];  Service: Gynecology;  Laterality: Bilateral;   POLYPECTOMY     HPP   TONSILLECTOMY     TRANSFORAMINAL LUMBAR INTERBODY FUSION (TLIF) WITH PEDICLE SCREW FIXATION 1 LEVEL N/A 12/01/2020   Procedure: Transiforaminal Lumbar Interbody Fusion Lumbar four--Lumbar five;  Surgeon: Bedelia Person, MD;  Location: [facility];  Service: Neurosurgery;  Laterality: N/A;   TRIGGER FINGER RELEASE Right 10/28/2020   Long finger   VULVAR LESION REMOVAL N/A 10/28/2021   Procedure: COMPLETE RESECTION OF VULVAR WARTS;  Surgeon: Hoover Browns, MD;  Location: [facility];  Service: Gynecology;  Laterality: N/A;    Allergies  Allergies  Allergen Reactions   Keflex [Cephalexin] Anaphylaxis    History of Present Illness    Megan Bradford is a 72 y.o. 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 Shari Prows.  She noted increasing palpitations November 2021 and monitor ordered which revealed several runs of SVT with longest 17 beats and occasional PAC.  She declined beta-blocker at that time.  Subsequent echocardiogram November 2021 with normal LVEF 65 to 70%, mild LVH, no wall motion abnormalities, grade 1 diastolic dysfunction, no significant valve  abnormalities.  Due to known coronary calcification on CT scan, Lexiscan Myoview performed 06/2020 was low risk study with no evidence of ischemia.  Had uncomplicated lap hysterectomy June 2023.   Had admission 6/28-30 2024 for syncope. After eating at [employer] with some vagal overtones and nausea with change in position No seizure activity or bowel/bladder incontinence V/Q inconclusive and she did not want anticoagulation with some pleuritic pain and elevated D dimer TTE was normal including RV. Telemetry with no arrhythmia Cramping  abdominal pain with diverticulosis on CT Rx with Augmentin   Doing well now and more active walking   EKGs/Labs/Other Studies Reviewed:   The following studies were reviewed today:  Lexiscan Myoview 07/08/2020: Nuclear stress EF: 67%. There was no ST segment deviation noted during stress. The study is normal. This is a low risk study. The left ventricular ejection fraction is hyperdynamic (>65%).   Normal pharmacologic nuclear stress test with no evidence for prior infarct or ischemia. LVEF 67%.   TTE 04/13/20: IMPRESSIONS   1. Left ventricular ejection fraction, by estimation, is 65 to 70%. The  left ventricle has normal function. The left ventricle has no regional  wall motion abnormalities. There is mild concentric left ventricular  hypertrophy. Left ventricular diastolic  parameters are consistent with Grade I diastolic dysfunction (impaired  relaxation).   2. Right ventricular systolic function is normal. The right ventricular  size is normal. There is normal pulmonary artery systolic pressure. The  estimated right ventricular systolic pressure is 31.3 mmHg.   3. The mitral valve is normal in structure. Mild mitral valve  regurgitation. No evidence of mitral stenosis.   4. The aortic valve is normal in structure. Aortic valve regurgitation is  not visualized. No aortic stenosis is present.   5. The inferior vena cava is normal in size with greater than 50%  respiratory variability, suggesting right atrial pressure of 3 mmHg.    LE ABI 04/15/20: Summary:  Right: Normal examination. No evidence of arterial occlusive disease.  Medial calcifications noted.   Left: Normal examination. No evidence of arterial occlusive disease.  Medial calcifications noted.    Right: Resting right ankle-brachial index indicates noncompressible right  lower extremity arteries. The right toe-brachial index is normal.   Left: Resting left ankle-brachial index indicates noncompressible left   lower extremity arteries. The left toe-brachial index is normal.    CT chest 06/17/20: IMPRESSION: 1. No imaging stigmata of sarcoidosis. 2. No imaging findings to suggest interstitial lung disease. 3. Moderate air trapping indicative of small airways disease. 4. Aortic atherosclerosis, in addition to left main and 2 vessel coronary artery disease. Please note that although the presence of coronary artery calcium documents the presence of coronary artery disease, the severity of this disease and any potential stenosis cannot be assessed on this non-gated CT examination. Assessment for potential risk factor modification, dietary therapy or pharmacologic therapy may be warranted, if clinically indicated. 5. Indeterminate left adrenal nodule measuring 2.5 x 1.9 cm. Follow-up adrenal protocol CT scan is recommended for further characterization. This recommendation follows ACR consensus guidelines: Management of Incidental Adrenal Masses: A White Paper of the ACR Incidental Findings Committee. J Am Coll Radiol 2017 (in Press).   PFT12/28/2020 FVC 1.76 [81%], FEV1 1.54 [90%], TLC 2.81 [62%], DLCO 13.05 [69%] Moderate restriction and diffusion impairment   Cardiac Monitor 03/2020: Predominant rhythm was NSR with average HR 76bpm (ranging from 56bpm to 190bpm) Patient had 250 runs of SVT with longest lasting 17 beats at average HR 117bpm.  Fastest was 4 beats at rate 190bpm. Occasional PACs (2.5%; 34547 beats); PVCs were rare Rare couplets (<1%, 2956), rare triplets (<1% 1597) Patient triggered events mainly correlated with SR/sinus tachycardia with PACs. No Afib, VT or significant pauses   TTE 11/11/22:    IMPRESSIONS     1. Left ventricular ejection fraction, by estimation, is 60 to 65%. The  left ventricle has normal function. The left ventricle has no regional  wall motion abnormalities. Left ventricular diastolic parameters are  consistent with Grade I diastolic  dysfunction  (impaired relaxation).   2. Right ventricular systolic function is normal. The right ventricular  size is normal. There is normal pulmonary artery systolic pressure. The  estimated right ventricular systolic pressure is 32.4 mmHg.   3. Left atrial size was mildly dilated.   4. The mitral valve is grossly normal. Mild mitral valve regurgitation.  No evidence of mitral stenosis.   5. The aortic valve is tricuspid. Aortic valve regurgitation is not  visualized. No aortic stenosis is present.   6. The inferior vena cava is normal in size with greater than 50%  respiratory variability, suggesting right atrial pressure of 3 mmHg.   Comparison(s): No significant change from prior study.    EKG:   SR rate 82 normal no RV strain 11/10/22   Recent Labs: 11/11/2022: ALT 11; BUN 28; Creatinine, Ser 1.76; Magnesium 1.9; Potassium 3.6; Sodium 136 11/12/2022: Hemoglobin 11.4; Platelets 194  Recent Lipid Panel    Component Value Date/Time   CHOL 143 10/14/2021 0142   CHOL 203 (H) 11/27/2019 1019   TRIG 64 10/14/2021 0142   HDL 56 10/14/2021 0142   HDL 73 11/27/2019 1019   CHOLHDL 2.6 10/14/2021 0142   VLDL 13 10/14/2021 0142   LDLCALC 74 10/14/2021 0142   LDLCALC 116 (H) 11/27/2019 1019    Home Medications   Current Meds  Medication Sig   amLODipine (NORVASC) 5 MG tablet Take 5 mg by mouth daily.   aspirin EC 81 MG tablet Take 81 mg by mouth daily. Swallow whole.   carvedilol (COREG) 12.5 MG tablet Take 12.5 mg by mouth 2 (two) times daily with a meal.   cetirizine (ZYRTEC) 10 MG tablet Take 10 mg by mouth daily.   Cholecalciferol (VITAMIN D3) 10 MCG (400 UNIT) CAPS Take 400 Units by mouth daily.   Coenzyme Q10 (CO Q10 PO) Take 1 capsule by mouth daily.   diclofenac Sodium (VOLTAREN) 1 % GEL Apply 2 g topically 4 (four) times daily. (Patient taking differently: Apply 2 g topically daily as needed (pain).)   diphenhydramine-acetaminophen (TYLENOL PM) 25-500 MG TABS tablet Take 1 tablet by  mouth at bedtime as needed (As needed).   LORazepam (ATIVAN) 2 MG tablet Take 2 mg by mouth 2 (two) times daily as needed.   losartan (COZAAR) 25 MG tablet Take 25 mg by mouth daily.   magnesium oxide (MAG-OX) 400 MG tablet Take 400 mg by mouth daily.   mycophenolate (MYFORTIC) 180 MG EC tablet Take 180 mg by mouth 2 (two) times daily.   rosuvastatin (CRESTOR) 20 MG tablet Take 1 tablet (20 mg total) by mouth daily.   tacrolimus (PROGRAF) 1 MG capsule TAKE 4 CAPSULE BY MOUTH EVERY MORNING AND 2 CAPSULE BY MOUTH EVERY EVENING (Patient taking differently: Take 4 mg by mouth 2 (two) times daily. 4 capsules every morning, and 2 capsules every evening.)     Review of Systems      All other systems reviewed and are  otherwise negative except as noted above.  Physical Exam    VS:  BP 132/72   Pulse 72   Ht 5' (1.524 m)   Wt 187 lb 9.6 oz (85.1 kg)   SpO2 98%   BMI 36.64 kg/m  , BMI Body mass index is 36.64 kg/m.  Wt Readings from Last 3 Encounters:  12/20/22 187 lb 9.6 oz (85.1 kg)  11/22/22 187 lb (84.8 kg)  11/12/22 190 lb 0.6 oz (86.2 kg)     GEN: Well nourished, well developed, in no acute distress. HEENT: normal. Neck: Supple, no JVD, carotid bruits, or masses. Cardiac: RRR, no murmurs, rubs, or gallops. No clubbing, cyanosis, edema.  Radials/PT 2+ and equal bilaterally.  Respiratory:  Respirations regular and unlabored, clear to auscultation bilaterally. GI: Soft, nontender, nondistended. MS: No deformity or atrophy. Skin: Warm and dry, no rash. Neuro:  Strength and sensation are intact. Psych: Normal affect.  Assessment & Plan    Syncope:  June 2024 likely vasovagal. PE not entirely r/o but TTE with normal RV/LV V/Q inconclusive and no CTA with renal transplant and baseline Cr 1.8. Patient deferred empiric anticoagulation. .D dimer elevated 2.57 Troponin negative and ECG with no acute changes no arrhythmias on telemetry RLE duplex negative for DVT with likely bakers cyst.    CAD -known coronary calcification by CT scan.  Myoview 06/2020 lower study with no evidence of ischemia.  ESRD s/p renal transplant 2016 with CKD IIIb -baseline creatinine 1.6-1.8.  3.7.  Follows with nephrology. Careful titration of diuretic and antihypertensive.   HTN - BP well controlled. Continue current antihypertensive regimen.    HLD -LDL goal less than 70.  07/2021 AST 12, ALT.lipid panel 06/2021 with LDL 72, triglycerides 51, HDL 68, total cholesterol 604.  Continue rosuvastatin 20 mg daily.  She prefers to remain on same dose and continue diet and lifestyle changes to get LDL from 72 to goal of less than 70.  DM2 - 11/2019 A1c 6.4. 07/06/2021 A1c 6.3. Continue to follow with PCP. Not presently on medical therapy.   Disposition: Follow up  in 6 months   Signed, Charlton Haws, MD 12/20/2022, 10:01 AM [facility]

## 2022-12-20 ENCOUNTER — Telehealth: Payer: Self-pay | Admitting: Cardiovascular Disease

## 2022-12-20 ENCOUNTER — Encounter: Payer: Self-pay | Admitting: Cardiovascular Disease

## 2022-12-20 ENCOUNTER — Ambulatory Visit: Payer: 59 | Attending: Cardiovascular Disease | Admitting: Cardiovascular Disease

## 2022-12-20 VITALS — BP 132/72 | HR 72 | Ht 60.0 in | Wt 187.6 lb

## 2022-12-20 DIAGNOSIS — E782 Mixed hyperlipidemia: Secondary | ICD-10-CM

## 2022-12-20 DIAGNOSIS — I10 Essential (primary) hypertension: Secondary | ICD-10-CM | POA: Diagnosis not present

## 2022-12-20 DIAGNOSIS — Z940 Kidney transplant status: Secondary | ICD-10-CM | POA: Diagnosis not present

## 2022-12-20 DIAGNOSIS — N1832 Chronic kidney disease, stage 3b: Secondary | ICD-10-CM

## 2022-12-20 DIAGNOSIS — R55 Syncope and collapse: Secondary | ICD-10-CM

## 2022-12-20 MED ORDER — ROSUVASTATIN CALCIUM 20 MG PO TABS: 20.0000 mg | ORAL_TABLET | Freq: Every day | ORAL | 3 refills | Status: DC

## 2022-12-20 NOTE — Telephone Encounter (Signed)
Pt c/o medication issue:  1. Name of Medication:   rosuvastatin (CRESTOR) 20 MG tablet    2. How are you currently taking this medication (dosage and times per day)?  Take 1 tablet (20 mg total) by mouth daily.       3. Are you having a reaction (difficulty breathing--STAT)? No  4. What is your medication issue? Pt is requesting a callback after noticing once she left her appt today she forgot to mention she was out of this medication and had no refills left so she'd need a new one sent to her listed Pharmacy and a call once it's done. Please advise

## 2022-12-20 NOTE — Patient Instructions (Signed)
Medication Instructions:  Your physician recommends that you continue on your current medications as directed. Please refer to the Current Medication list given to you today.  *If you need a refill on your cardiac medications before your next appointment, please call your pharmacy*  Lab Work: If you have labs (blood work) drawn today and your tests are completely normal, you will receive your results only by: MyChart Message (if you have MyChart) OR A paper copy in the mail If you have any lab test that is abnormal or we need to change your treatment, we will call you to review the results.  Follow-Up: At [facility], you and your health needs are our priority.  As part of our continuing mission to provide you with exceptional heart care, we have created designated Provider Care Teams.  These Care Teams include your primary Cardiologist (physician) and Advanced Practice Providers (APPs -  Physician Assistants and Nurse Practitioners) who all work together to provide you with the care you need, when you need it.  We recommend signing up for the patient portal called "MyChart".  Sign up information is provided on this After Visit Summary.  MyChart is used to connect with patients for Virtual Visits (Telemedicine).  Patients are able to view lab/test results, encounter notes, upcoming appointments, etc.  Non-urgent messages can be sent to your provider as well.   To learn more about what you can do with MyChart, go to [email or web address].    Your next appointment:   6 month(s)  Provider:   Peter Nishan, MD     

## 2022-12-20 NOTE — Telephone Encounter (Signed)
Called patient back and informed her that Crestor has been sent to her pharmacy.

## 2022-12-26 ENCOUNTER — Ambulatory Visit: Payer: 59 | Admitting: Gastroenterology

## 2023-01-03 ENCOUNTER — Encounter: Payer: Self-pay | Admitting: Obstetrics & Gynecology

## 2023-02-21 ENCOUNTER — Ambulatory Visit: Payer: 59 | Admitting: Emergency Medicine

## 2023-03-30 ENCOUNTER — Other Ambulatory Visit: Payer: Self-pay | Admitting: Emergency Medicine

## 2023-05-17 DIAGNOSIS — H524 Presbyopia: Secondary | ICD-10-CM | POA: Diagnosis not present

## 2023-05-23 DIAGNOSIS — M8588 Other specified disorders of bone density and structure, other site: Secondary | ICD-10-CM | POA: Diagnosis not present

## 2023-06-07 DIAGNOSIS — Z79899 Other long term (current) drug therapy: Secondary | ICD-10-CM | POA: Diagnosis not present

## 2023-06-07 DIAGNOSIS — Z940 Kidney transplant status: Secondary | ICD-10-CM | POA: Diagnosis not present

## 2023-06-07 DIAGNOSIS — R809 Proteinuria, unspecified: Secondary | ICD-10-CM | POA: Diagnosis not present

## 2023-06-07 DIAGNOSIS — I129 Hypertensive chronic kidney disease with stage 1 through stage 4 chronic kidney disease, or unspecified chronic kidney disease: Secondary | ICD-10-CM | POA: Diagnosis not present

## 2023-06-07 DIAGNOSIS — N2581 Secondary hyperparathyroidism of renal origin: Secondary | ICD-10-CM | POA: Diagnosis not present

## 2023-06-07 DIAGNOSIS — N1832 Chronic kidney disease, stage 3b: Secondary | ICD-10-CM | POA: Diagnosis not present

## 2023-06-09 LAB — LAB REPORT - SCANNED
Calcium: 10.2
EGFR: 33

## 2023-06-13 DIAGNOSIS — M85851 Other specified disorders of bone density and structure, right thigh: Secondary | ICD-10-CM | POA: Diagnosis not present

## 2023-06-28 ENCOUNTER — Telehealth: Payer: Self-pay | Admitting: Emergency Medicine

## 2023-06-28 NOTE — Telephone Encounter (Unsigned)
Copied from CRM [id number]. Topic: Referral - Request for Referral >> Jun 28, 2023 11:41 AM Hector Shade B wrote: Did the patient discuss referral with their provider in the last year? Yes (If No - schedule appointment) (If Yes - send message)  Appointment offered? No  Type of order/referral and detailed reason for visit: Patient is requesting a referral for endocrinologist  Preference of office, provider, location: Provider would need to be close to her location and in her insurance plan  If referral order, have you been seen by this specialty before? No (If Yes, this issue or another issue? When? Where?  Can we respond through MyChart? Yes   [phone] (M) Patient contact information

## 2023-06-29 ENCOUNTER — Encounter: Payer: Self-pay | Admitting: Radiology

## 2023-06-29 ENCOUNTER — Other Ambulatory Visit: Payer: Self-pay | Admitting: Radiology

## 2023-06-29 DIAGNOSIS — D497 Neoplasm of unspecified behavior of endocrine glands and other parts of nervous system: Secondary | ICD-10-CM

## 2023-06-29 NOTE — Telephone Encounter (Signed)
Please find out first the reason for the referral.  Not sure why.  Thanks.

## 2023-06-29 NOTE — Telephone Encounter (Signed)
Referral placed.

## 2023-06-29 NOTE — Telephone Encounter (Signed)
Copied from CRM [id number]. Topic: General - Other >> Jun 29, 2023  1:21 PM Almira Coaster wrote: Reason for CRM: Patient is returning a call she received from [facility] regarding Endo referral, patient states the reason is due to a growth on the adrenal gland.

## 2023-06-29 NOTE — Telephone Encounter (Signed)
LVM for patient to call back or leave a Mychart message in regards of referral for endocrinologist

## 2023-07-19 ENCOUNTER — Encounter: Payer: Self-pay | Admitting: Nephrology

## 2023-07-19 DIAGNOSIS — Z79899 Other long term (current) drug therapy: Secondary | ICD-10-CM

## 2023-07-19 DIAGNOSIS — Z940 Kidney transplant status: Secondary | ICD-10-CM

## 2023-07-19 DIAGNOSIS — N1832 Chronic kidney disease, stage 3b: Secondary | ICD-10-CM

## 2023-07-19 DIAGNOSIS — N2581 Secondary hyperparathyroidism of renal origin: Secondary | ICD-10-CM

## 2023-07-19 DIAGNOSIS — R809 Proteinuria, unspecified: Secondary | ICD-10-CM

## 2023-07-19 DIAGNOSIS — I129 Hypertensive chronic kidney disease with stage 1 through stage 4 chronic kidney disease, or unspecified chronic kidney disease: Secondary | ICD-10-CM

## 2023-07-20 ENCOUNTER — Other Ambulatory Visit: Payer: Self-pay | Admitting: Nephrology

## 2023-07-20 DIAGNOSIS — Z940 Kidney transplant status: Secondary | ICD-10-CM

## 2023-07-26 ENCOUNTER — Ambulatory Visit
Admission: RE | Admit: 2023-07-26 | Discharge: 2023-07-26 | Disposition: A | Discharge status: Home / Self Care | Source: Ambulatory Visit | Attending: Nephrology | Admitting: Nephrology

## 2023-07-26 DIAGNOSIS — Z940 Kidney transplant status: Secondary | ICD-10-CM | POA: Diagnosis not present

## 2023-08-30 DIAGNOSIS — Z5181 Encounter for therapeutic drug level monitoring: Secondary | ICD-10-CM | POA: Diagnosis not present

## 2023-08-30 DIAGNOSIS — Z7189 Other specified counseling: Secondary | ICD-10-CM | POA: Diagnosis not present

## 2023-08-30 DIAGNOSIS — I10 Essential (primary) hypertension: Secondary | ICD-10-CM | POA: Diagnosis not present

## 2023-08-30 DIAGNOSIS — D849 Immunodeficiency, unspecified: Secondary | ICD-10-CM | POA: Diagnosis not present

## 2023-08-30 DIAGNOSIS — Z4822 Encounter for aftercare following kidney transplant: Secondary | ICD-10-CM | POA: Diagnosis not present

## 2023-08-30 DIAGNOSIS — E1169 Type 2 diabetes mellitus with other specified complication: Secondary | ICD-10-CM | POA: Diagnosis not present

## 2023-08-30 DIAGNOSIS — Z79899 Other long term (current) drug therapy: Secondary | ICD-10-CM | POA: Diagnosis not present

## 2023-08-30 DIAGNOSIS — Z940 Kidney transplant status: Secondary | ICD-10-CM | POA: Diagnosis not present

## 2023-08-30 DIAGNOSIS — Z79621 Long term (current) use of calcineurin inhibitor: Secondary | ICD-10-CM | POA: Diagnosis not present

## 2023-09-05 ENCOUNTER — Telehealth: Payer: Self-pay | Admitting: *Deleted

## 2023-09-05 NOTE — Telephone Encounter (Signed)
 Returned the patient's call and [facility] for the patient to call the office back.

## 2023-09-12 ENCOUNTER — Ambulatory Visit: Admitting: Orthopaedic Surgery

## 2023-09-14 ENCOUNTER — Other Ambulatory Visit: Payer: Self-pay

## 2023-09-14 ENCOUNTER — Emergency Department (HOSPITAL_COMMUNITY)
Admission: EM | Admit: 2023-09-14 | Discharge: 2023-09-14 | Disposition: A | Discharge status: Home / Self Care | Attending: Emergency Medicine | Admitting: Emergency Medicine

## 2023-09-14 ENCOUNTER — Encounter (HOSPITAL_COMMUNITY): Payer: Self-pay

## 2023-09-14 ENCOUNTER — Emergency Department (HOSPITAL_COMMUNITY)

## 2023-09-14 DIAGNOSIS — R079 Chest pain, unspecified: Secondary | ICD-10-CM | POA: Diagnosis not present

## 2023-09-14 DIAGNOSIS — R404 Transient alteration of awareness: Secondary | ICD-10-CM | POA: Diagnosis not present

## 2023-09-14 DIAGNOSIS — R55 Syncope and collapse: Secondary | ICD-10-CM | POA: Diagnosis not present

## 2023-09-14 DIAGNOSIS — I517 Cardiomegaly: Secondary | ICD-10-CM | POA: Diagnosis not present

## 2023-09-14 DIAGNOSIS — Z7982 Long term (current) use of aspirin: Secondary | ICD-10-CM | POA: Insufficient documentation

## 2023-09-14 DIAGNOSIS — Z1231 Encounter for screening mammogram for malignant neoplasm of breast: Secondary | ICD-10-CM | POA: Diagnosis not present

## 2023-09-14 DIAGNOSIS — N189 Chronic kidney disease, unspecified: Secondary | ICD-10-CM | POA: Insufficient documentation

## 2023-09-14 DIAGNOSIS — Z940 Kidney transplant status: Secondary | ICD-10-CM | POA: Insufficient documentation

## 2023-09-14 DIAGNOSIS — I700 Atherosclerosis of aorta: Secondary | ICD-10-CM | POA: Diagnosis not present

## 2023-09-14 LAB — COMPREHENSIVE METABOLIC PANEL WITH GFR
ALT: 9 U/L (ref 0–44)
AST: 18 U/L (ref 15–41)
Albumin: 3.8 g/dL (ref 3.5–5.0)
Alkaline Phosphatase: 53 U/L (ref 38–126)
Anion gap: 10 (ref 5–15)
BUN: 35 mg/dL — ABNORMAL HIGH (ref 8–23)
CO2: 26 mmol/L (ref 22–32)
Calcium: 9.8 mg/dL (ref 8.9–10.3)
Chloride: 101 mmol/L (ref 98–111)
Creatinine, Ser: 1.95 mg/dL — ABNORMAL HIGH (ref 0.44–1.00)
GFR, Estimated: 27 mL/min — ABNORMAL LOW (ref >60)
Glucose, Bld: 131 mg/dL — ABNORMAL HIGH (ref 70–99)
Potassium: 3.8 mmol/L (ref 3.5–5.1)
Sodium: 137 mmol/L (ref 135–145)
Total Bilirubin: 1.5 mg/dL — ABNORMAL HIGH (ref 0.0–1.2)
Total Protein: 7.3 g/dL (ref 6.5–8.1)

## 2023-09-14 LAB — CBC WITH DIFFERENTIAL/PLATELET
Abs Immature Granulocytes: 0.03 10*3/uL (ref 0.00–0.07)
Basophils Absolute: 0.1 10*3/uL (ref 0.0–0.1)
Basophils Relative: 1 %
Eosinophils Absolute: 0.1 10*3/uL (ref 0.0–0.5)
Eosinophils Relative: 1 %
HCT: 43.7 % (ref 36.0–46.0)
Hemoglobin: 13.7 g/dL (ref 12.0–15.0)
Immature Granulocytes: 0 %
Lymphocytes Relative: 14 %
Lymphs Abs: 1.1 10*3/uL (ref 0.7–4.0)
MCH: 28.4 pg (ref 26.0–34.0)
MCHC: 31.4 g/dL (ref 30.0–36.0)
MCV: 90.5 fL (ref 80.0–100.0)
Monocytes Absolute: 0.7 10*3/uL (ref 0.1–1.0)
Monocytes Relative: 10 %
Neutro Abs: 5.8 10*3/uL (ref 1.7–7.7)
Neutrophils Relative %: 74 %
Platelets: 222 10*3/uL (ref 150–400)
RBC: 4.83 MIL/uL (ref 3.87–5.11)
RDW: 14.9 % (ref 11.5–15.5)
WBC: 7.8 10*3/uL (ref 4.0–10.5)
nRBC: 0 % (ref 0.0–0.2)

## 2023-09-14 LAB — CBG MONITORING, ED: Glucose-Capillary: 126 mg/dL — ABNORMAL HIGH (ref 70–99)

## 2023-09-14 LAB — TROPONIN I (HIGH SENSITIVITY): Troponin I (High Sensitivity): 6 ng/L (ref <18)

## 2023-09-14 LAB — D-DIMER, QUANTITATIVE: D-Dimer, Quant: 0.47 ug{FEU}/mL (ref 0.00–0.50)

## 2023-09-14 MED ORDER — SODIUM CHLORIDE 0.9 % IV BOLUS: 500.0000 mL | Freq: Once | INTRAVENOUS | Status: DC

## 2023-09-14 NOTE — ED Triage Notes (Signed)
 [facility] after having a syncopal episode then another near syncopal episode. Pt has had weakness over the last week. Per EMS pt had faint radial pulses on scene.   A&Ox4  134/90 98% RA 176 cbg

## 2023-09-14 NOTE — ED Provider Notes (Signed)
 [place] EMERGENCY DEPARTMENT AT [facility] Provider Note   CSN: [id number] Arrival date & time: 09/14/23  1236     History  Chief Complaint  Patient presents with   Loss of Consciousness    Megan Bradford is a 73 y.o. female.   Loss of Consciousness    Pt was standing for the bus when he legs felt weak and she felt she was going to pass out.  Pt was able to sit down.  The bus came and she was get up and get on the bus.  Pt then got off the bus and she called her daughter.  She then started to feel like she was going to pass out again. Pt states she has felt short of breath at times.  NO fevers measure but she is not sure, no cough.    She did feel like her lips were chapped.  Some chest aching earlier.  Home Medications Prior to Admission medications   Medication Sig Start Date End Date Taking? Authorizing Provider  amLODipine  (NORVASC ) 5 MG tablet Take 5 mg by mouth daily.    [provider]  aspirin  EC 81 MG tablet Take 81 mg by mouth daily. Swallow whole.    [provider]  carvedilol  (COREG ) 12.5 MG tablet Take 12.5 mg by mouth 2 (two) times daily with a meal.    [provider]  cetirizine (ZYRTEC) 10 MG tablet Take 10 mg by mouth daily.    [provider]  Cholecalciferol  (VITAMIN D3) 10 MCG (400 UNIT) CAPS Take 400 Units by mouth daily.    [provider]  Coenzyme Q10 (CO Q10 PO) Take 1 capsule by mouth daily.    [provider]  diclofenac  Sodium (VOLTAREN ) 1 % GEL Apply 2 g topically 4 (four) times daily. Patient taking differently: Apply 2 g topically daily as needed (pain). 12/01/19   Wieters, Hallie C, [name]  diphenhydramine -acetaminophen  (TYLENOL  PM) 25-500 MG TABS tablet Take 1 tablet by mouth at bedtime as needed (As needed).    [provider]  lisinopril  (ZESTRIL ) 5 MG tablet Take 5 mg by mouth daily. Patient not taking: Reported on 12/20/2022 03/26/21   [provider]   LORazepam  (ATIVAN ) 2 MG tablet Take 2 mg by mouth 2 (two) times daily as needed. 01/10/22   [provider]  losartan (COZAAR) 25 MG tablet Take 25 mg by mouth daily. 12/19/22   [provider]  magnesium  oxide (MAG-OX) 400 MG tablet Take 400 mg by mouth daily.    [provider]  mycophenolate  (MYFORTIC ) 180 MG EC tablet Take 180 mg by mouth 2 (two) times daily.    [provider]  rosuvastatin  (CRESTOR ) 20 MG tablet Take 1 tablet (20 mg total) by mouth daily. 12/20/22   Loyde Rule, MD  tacrolimus  (PROGRAF ) 1 MG capsule TAKE 4 CAPSULE BY MOUTH EVERY MORNING AND 2 CAPSULE BY MOUTH EVERY EVENING 04/01/23   Sagardia, Miguel Jose, MD      Allergies    Keflex [cephalexin]    Review of Systems   Review of Systems  Cardiovascular:  Positive for syncope.    Physical Exam Updated Vital Signs BP (!) 139/91   Pulse 74   Temp 97.6 F (36.4 C) (Oral)   Resp 17   SpO2 99%  Physical Exam  ED Results / Procedures / Treatments   Labs (all labs ordered are listed, but only abnormal results are displayed) Labs Reviewed  COMPREHENSIVE METABOLIC PANEL  WITH GFR - Abnormal; Notable for the following components:      Result Value   Glucose, Bld 131 (*)    BUN 35 (*)    Creatinine, Ser 1.95 (*)    Total Bilirubin 1.5 (*)    GFR, Estimated 27 (*)    All other components within normal limits  CBG MONITORING, ED - Abnormal; Notable for the following components:   Glucose-Capillary 126 (*)    All other components within normal limits  CBC WITH DIFFERENTIAL/PLATELET  D-DIMER, QUANTITATIVE  TROPONIN I (HIGH SENSITIVITY)    EKG EKG Interpretation Date/Time:  Friday Sep 14 2023 14:35:08 EDT Ventricular Rate:  74 PR Interval:  170 QRS Duration:  90 QT Interval:  390 QTC Calculation: 433 R Axis:   -43  Text Interpretation: Sinus rhythm Probable left atrial enlargement Left axis deviation Confirmed by Trish Furl [phone]) on 09/14/2023 2:36:33  PM  Radiology DG Chest Port 1 View Result Date: 09/14/2023 CLINICAL DATA:  Near syncope. EXAM: PORTABLE CHEST 1 VIEW COMPARISON:  Chest radiograph dated 11/10/2022. FINDINGS: No focal consolidation, pleural effusion or pneumothorax. Mild cardiomegaly. Atherosclerotic calcification of the aorta. Degenerative changes spine. No acute osseous pathology. IMPRESSION: 1. No active disease. 2. Mild cardiomegaly. Electronically Signed   By: Angus Bark M.D.   On: 09/14/2023 14:54    Procedures Procedures    Medications Ordered in ED Medications  sodium chloride  0.9 % bolus 500 mL (has no administration in time range)    ED Course/ Medical Decision Making/ A&P Clinical Course as of 09/14/23 1625  Fri Sep 14, 2023  1511 CBC WITH DIFFERENTIAL CBC normal.  D-dimer normal. [JK]  1552 Comprehensive metabolic panel(!) [JK]    Clinical Course User Index [JK] Trish Furl, MD                                 Medical Decision Making Frontal diagnosis includes but not limited to cardiac dysrhythmia, anemia, AKI, dehydration,  Amount and/or Complexity of Data Reviewed Labs: ordered. Decision-making details documented in ED Course. Radiology: ordered and independent interpretation performed.  Patient presented to the ED for evaluation of near syncopal episode.  Patient had 2 episodes today where she felt lightheaded and had to sit down.  ED workup reassuring.  Patient's exam was normal.  She was not hypotensive or tachycardic.  She did not have any oxygen requirement.  Consider the possibility of PE D-dimer is negative low suspicion at this time.  EKG and cardiac enzymes are reassuring.  No dysrhythmia noted.  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  Creatinine is similar compared to previous values.  Patient denies any vomiting or diarrhea but thinks she might of been a little bit dehydrated as her urine looked dark.  Patient was able to ambulate to the bathroom without difficulty.   Discussed options of staying in the hospital overnight for observation for further monitoring versus close outpatient follow-up.  Patient states she feels well and would like to go home.  Discussed to return immediately if she has any recurrent symptoms otherwise outpatient follow-up.         Final Clinical Impression(s) / ED Diagnoses Final diagnoses:  Near syncope    Rx / DC Orders ED Discharge Orders     None         Trish Furl, MD 09/14/23 1625

## 2023-09-14 NOTE — Discharge Instructions (Signed)
 Drink plenty of fluids.  Follow-up with your primary care doctor next week to be rechecked.  Consider seeing your cardiologist sooner to discuss possible outpatient cardiac monitoring.  Return to the ED if you have any recurrent episodes.

## 2023-09-18 ENCOUNTER — Other Ambulatory Visit (INDEPENDENT_AMBULATORY_CARE_PROVIDER_SITE_OTHER)

## 2023-09-18 ENCOUNTER — Encounter: Payer: Self-pay | Admitting: Orthopaedic Surgery

## 2023-09-18 ENCOUNTER — Ambulatory Visit (INDEPENDENT_AMBULATORY_CARE_PROVIDER_SITE_OTHER): Admitting: Orthopaedic Surgery

## 2023-09-18 DIAGNOSIS — M2022 Hallux rigidus, left foot: Secondary | ICD-10-CM | POA: Diagnosis not present

## 2023-09-18 DIAGNOSIS — M1711 Unilateral primary osteoarthritis, right knee: Secondary | ICD-10-CM

## 2023-09-18 MED ORDER — METHYLPREDNISOLONE ACETATE 40 MG/ML IJ SUSP
40.0000 mg | INTRAMUSCULAR | Status: AC | PRN
  Administered 2023-09-18: 40 mg via INTRA_ARTICULAR

## 2023-09-18 MED ORDER — BUPIVACAINE HCL 0.5 % IJ SOLN
2.0000 mL | INTRAMUSCULAR | Status: AC | PRN
  Administered 2023-09-18: 2 mL via INTRA_ARTICULAR

## 2023-09-18 MED ORDER — LIDOCAINE HCL 1 % IJ SOLN
2.0000 mL | INTRAMUSCULAR | Status: AC | PRN
  Administered 2023-09-18: 2 mL

## 2023-09-18 NOTE — Progress Notes (Signed)
 Office Visit Note   Patient: Megan Bradford           Date of Birth: 1950-08-19           MRN: [id number] Visit Date: 09/18/2023              Requested by: Elvira Hammersmith, MD [address] [place],  [place] [address] PCP: Elvira Hammersmith, MD   Assessment & Plan: Visit Diagnoses:  1. Hallux rigidus, left foot   2. Primary osteoarthritis of right knee     Plan: History of Present Illness Megan Bradford is a 73 year old female with osteoarthritis who presents with worsening pain in her left big toe and right knee.  She experiences persistent pain in her left big toe, with the left side more affected. An x-ray in 2023 shows bone-on-bone contact. The pain is constant and significantly impairs her ability to walk.  She also has pain in her right knee, described as a sensation of bones rubbing together. The pain is pervasive throughout the knee area. A previous bone density test indicated osteopenia.  She manages her symptoms by altering footwear and activities, using stiffer orthotics to limit toe movement.  Results RADIOLOGY Right knee x-ray: Bone on bone, degenerative arthritis (09/18/2023) Left foot x-ray: Bone on bone, cysts, bone spur, degenerative arthritis (09/18/2023)  Assessment and Plan Degenerative arthritis of right knee Chronic degenerative arthritis with bone-on-bone contact, significant wear to the medial tibia. Managed conservatively. Options include continued conservative management or knee replacement.  She will think about her options - Order x-ray of right knee. - Administer cortisone injection in right knee. - Recommend low-impact activities such as aqua therapy. - Advise continuation of gym activities with caution to avoid overstraining.  Degenerative arthritis of left big toe Degenerative arthritis with bone spurs and cysts. Managed with conservative measures. Surgical fusion available if conservative management fails. - Order x-ray of  left big toe. - Discuss surgical option of fusion for left big toe if conservative management fails.  Follow-Up Instructions: No follow-ups on file.   Orders:  Orders Placed This Encounter  Procedures   Large Joint Inj: R knee   XR Foot Complete Left   XR KNEE 3 VIEW RIGHT   No orders of the defined types were placed in this encounter.     Procedures: Large Joint Inj: R knee on 09/18/2023 10:06 AM Indications: pain Details: 22 G needle  Arthrogram: No  Medications: 40 mg methylPREDNISolone  acetate 40 MG/ML; 2 mL lidocaine  1 %; 2 mL bupivacaine  0.5 % Consent was given by the patient. Patient was prepped and draped in the usual sterile fashion.       Clinical Data: No additional findings.   Subjective: Chief Complaint  Patient presents with   Right Leg - Pain   Left Foot - Pain    HPI  Review of Systems  Constitutional: Negative.   HENT: Negative.    Eyes: Negative.   Respiratory: Negative.    Cardiovascular: Negative.   Endocrine: Negative.   Musculoskeletal: Negative.   Neurological: Negative.   Hematological: Negative.   Psychiatric/Behavioral: Negative.    All other systems reviewed and are negative.    Objective: Vital Signs: There were no vitals taken for this visit.  Physical Exam Vitals and nursing note reviewed.  Constitutional:      Appearance: She is well-developed.  HENT:     Head: Atraumatic.     Nose: Nose normal.  Eyes:     Extraocular  Movements: Extraocular movements intact.  Cardiovascular:     Pulses: Normal pulses.  Pulmonary:     Effort: Pulmonary effort is normal.  Abdominal:     Palpations: Abdomen is soft.  Musculoskeletal:     Cervical back: Neck supple.  Skin:    General: Skin is warm.     Capillary Refill: Capillary refill takes less than 2 seconds.  Neurological:     Mental Status: She is alert. Mental status is at baseline.  Psychiatric:        Behavior: Behavior normal.        Thought Content: Thought  content normal.        Judgment: Judgment normal.     Ortho Exam  Specialty Comments:  No specialty comments available.  Imaging: XR Foot Complete Left Result Date: 09/18/2023 X-rays of the left foot show advanced degenerative changes of the MTP joint with bone-on-bone joint space narrowing.  Large osteophytic spur on the medial side of the joint.  XR KNEE 3 VIEW RIGHT Result Date: 09/18/2023 X-rays demonstrate severe osteoarthritis.  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   Diverticulosis 02/02/2022   Hospital discharge follow-up 10/20/2021   Dyslipidemia 10/20/2021   Chronic diastolic CHF (congestive heart failure) (HCC) 10/14/2021   Hyperlipidemia 10/14/2021   Dyssynergic defecation    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/01/2020   Coronary artery disease involving native coronary artery of native heart 08/30/2020   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/30/2020   CKD stage 3b, GFR 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) 12/17/2019   History of kidney transplant 11/27/2019   Essential hypertension 11/27/2019   Prediabetes 11/27/2019   Obesity (BMI 30-39.9) 11/27/2019   Past Medical History:  Diagnosis Date   Cataract    [employer] 11/24/2019- removed both   Chronic kidney disease    [employer] 11/24/2019   Clotting disorder (HCC)    PE 2010 per pt there really was not a PE   Fistula    LUE; was on dialysis for 11 months   Heart murmur    Hypertension    [employer] 11/24/2019   Kidney transplanted    transplant team [facility])   Pre-diabetes    no meds   Pulmonary embolism (HCC) 2010   never symptomatic    Family History  Problem Relation Age of Onset   Diabetes Mother    Asthma  Mother    Hypertension Father    Coronary artery disease Father    Colon cancer Neg Hx    Colon polyps Neg Hx    Esophageal cancer Neg Hx    Rectal cancer Neg Hx    Stomach cancer Neg Hx    Breast cancer Neg Hx    Ovarian cancer Neg Hx    Endometrial cancer Neg Hx    Pancreatic cancer Neg Hx    Prostate cancer Neg Hx     Past Surgical History:  Procedure Laterality Date   ANAL RECTAL MANOMETRY N/A 08/03/2021   Procedure: ANO RECTAL MANOMETRY;  Surgeon: Lindle Rhea, MD;  Location: [facility];  Service: Gastroenterology;  Laterality: N/A;   CARDIAC CATHETERIZATION     x2   CESAREAN SECTION N/A    [employer] 11/24/2019   CHOLECYSTECTOMY N/A  [employer] 11/24/2019   COLONOSCOPY     DILATATION & CURETTAGE/HYSTEROSCOPY WITH MYOSURE N/A 10/28/2021   Procedure: DILATATION & CURETTAGE/HYSTEROSCOPY;  Surgeon: Vernal Gold, MD;  Location: [facility];  Service: Gynecology;  Laterality: N/A;   DILATION AND CURETTAGE OF UTERUS     EYE SURGERY N/A    Cataracts removed [employer] 11/24/2019   HIP SURGERY Left 1990   Hip dysplasia cleaned out per patient   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   LAPAROSCOPIC SALPINGO OOPHERECTOMY Bilateral 10/28/2021   Procedure: LAPAROSCOPIC SALPINGO OOPHORECTOMY WITH PELVIC WASHINGS;  Surgeon: Vernal Gold, MD;  Location: [facility];  Service: Gynecology;  Laterality: Bilateral;   POLYPECTOMY     HPP   TONSILLECTOMY     TRANSFORAMINAL LUMBAR INTERBODY FUSION (TLIF) WITH PEDICLE SCREW FIXATION 1 LEVEL N/A 12/01/2020   Procedure: Transiforaminal Lumbar Interbody Fusion Lumbar four--Lumbar five;  Surgeon: Van Gelinas, MD;  Location: [facility];  Service: Neurosurgery;  Laterality: N/A;   TRIGGER FINGER RELEASE Right 10/28/2020   Long finger   VULVAR LESION REMOVAL N/A 10/28/2021   Procedure: COMPLETE RESECTION OF VULVAR WARTS;  Surgeon: Vernal Gold, MD;  Location: [facility];  Service: Gynecology;  Laterality: N/A;    Social History   Occupational History   Not on file  Tobacco Use   Smoking status: Never   Smokeless tobacco: Never  Vaping Use   Vaping status: Never Used  Substance and Sexual Activity   Alcohol use: Never   Drug use: Never   Sexual activity: Not Currently    Birth control/protection: None

## 2023-09-20 ENCOUNTER — Other Ambulatory Visit: Payer: Self-pay | Admitting: Emergency Medicine

## 2023-10-01 DIAGNOSIS — I129 Hypertensive chronic kidney disease with stage 1 through stage 4 chronic kidney disease, or unspecified chronic kidney disease: Secondary | ICD-10-CM | POA: Diagnosis not present

## 2023-10-01 DIAGNOSIS — N1832 Chronic kidney disease, stage 3b: Secondary | ICD-10-CM | POA: Diagnosis not present

## 2023-10-01 DIAGNOSIS — Z940 Kidney transplant status: Secondary | ICD-10-CM | POA: Diagnosis not present

## 2023-10-01 DIAGNOSIS — Z79899 Other long term (current) drug therapy: Secondary | ICD-10-CM | POA: Diagnosis not present

## 2023-10-01 DIAGNOSIS — N2581 Secondary hyperparathyroidism of renal origin: Secondary | ICD-10-CM | POA: Diagnosis not present

## 2023-10-03 ENCOUNTER — Ambulatory Visit: Admitting: Orthopaedic Surgery

## 2023-10-09 ENCOUNTER — Ambulatory Visit: Admitting: Orthopaedic Surgery

## 2023-10-09 ENCOUNTER — Encounter: Payer: Self-pay | Admitting: Gynecologic Oncology

## 2023-10-12 ENCOUNTER — Encounter: Payer: Self-pay | Admitting: Gynecologic Oncology

## 2023-10-12 ENCOUNTER — Inpatient Hospital Stay: Attending: Gynecologic Oncology | Admitting: Gynecologic Oncology

## 2023-10-12 ENCOUNTER — Inpatient Hospital Stay

## 2023-10-12 VITALS — BP 134/70 | HR 60 | Temp 97.6°F | Resp 17 | Ht 61.0 in | Wt 185.0 lb

## 2023-10-12 DIAGNOSIS — A630 Anogenital (venereal) warts: Secondary | ICD-10-CM | POA: Insufficient documentation

## 2023-10-12 DIAGNOSIS — Z90722 Acquired absence of ovaries, bilateral: Secondary | ICD-10-CM | POA: Diagnosis not present

## 2023-10-12 DIAGNOSIS — Z8603 Personal history of neoplasm of uncertain behavior: Secondary | ICD-10-CM | POA: Diagnosis not present

## 2023-10-12 DIAGNOSIS — D3910 Neoplasm of uncertain behavior of unspecified ovary: Secondary | ICD-10-CM

## 2023-10-12 DIAGNOSIS — Z940 Kidney transplant status: Secondary | ICD-10-CM | POA: Diagnosis not present

## 2023-10-12 DIAGNOSIS — Z9079 Acquired absence of other genital organ(s): Secondary | ICD-10-CM | POA: Insufficient documentation

## 2023-10-12 DIAGNOSIS — Z7960 Long term (current) use of unspecified immunomodulators and immunosuppressants: Secondary | ICD-10-CM | POA: Insufficient documentation

## 2023-10-12 NOTE — Patient Instructions (Signed)
 It was good to see you today.  I do not see or feel any evidence of cancer recurrence on your exam.  Please reach out to your OB/GYN for a follow-up visit in 6 months.  We will see you for follow-up in 12 months.  If you have worsening of your urinary symptoms or would like to see the specialist we talked about, please call the clinic to let me know.  As always, if you develop any new and concerning symptoms before your next visit, please call to see me sooner.

## 2023-10-12 NOTE — Progress Notes (Signed)
 Gynecologic Oncology Return Clinic Visit  10/12/23  Reason for Visit: surveillance  Treatment History: The patient has been followed for a history of a complex right ovarian cyst, pelvic pain, and an endometrial lesion suspicious for a polyp.  Ultrasound in December 2022 showed a hyperechoic structure at the fundus measuring up to 1.1 cm, consistent with a likely polyp.  Right ovary measured 2.9 x 2.3 x 2.4 cm with a cystic structure with low-level level internal echoes.  On follow-up ultrasound in February 2023, 1.4 cm cystic structure within the endometrial cavity noted with a feeder vessel.  Right ovary measured 2.3 x 2.4 x 2.2 centimeters with a cystic lesion again noted with low-level internal echoes. CA-125 in 05/2021 was 11.6.   On 10/28/2021, the patient underwent laparoscopic BSO, dilation and curettage with hysteroscopy, excision of vulvar warts.  Findings at the time of surgery were a normal-appearing left ovary.  Right ovary with a 3 cm cyst.  Normal bilateral fallopian tubes.  The right adnexa was removed without surgical spill.  There was perforation of the uterus upon placement of the Hul[place] (recognized after intra-abdominal entry).  Hysteroscopy and D&C was performed subsequently.   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  Pelvic washings were negative.  The specimen was sent out to [facility] where Dr. 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  He commented that there was a diffuse growth of somewhat more rounded epithelioid appearing cells consistent with a diffuse pattern of adult granulosa cell tumor as well as the presence of scattered lutein cells.   Inhibin B on 11/29/21 was <7.    CT scan of the abdomen and pelvis on 01/13/2022 showed no acute intra-abdominal or pelvic pathology.  Right lower quadrant renal transplant noted  without hydronephrosis or nephrolithiasis.  Sigmoid diverticulosis.  Indeterminate 2.5 cm left adrenal nodule that could be better assessed by MRI.  Interval History: Overall doing well.  Denies any abdominal pain.  Denies any vaginal bleeding.  Reports some difficulty with initiation of her urine stream, sometimes has to wait, push, or change position.  Currently having some looser stool, which is common for her in the setting of diverticulosis.  Past Medical/Surgical History: Past Medical History:  Diagnosis Date   Cataract    Phreesia 11/24/2019- removed both   Chronic kidney disease    Phreesia 11/24/2019   Clotting disorder (HCC)    PE 2010 per pt there really was not a PE   Fistula    LUE; was on dialysis for 11 months   Heart murmur    Hypertension    Phreesia 11/24/2019   Kidney transplanted    transplant team [facility] [place])   Pre-diabetes    no meds   Pulmonary embolism (HCC) 2010   never symptomatic    Past Surgical History:  Procedure Laterality Date   ANAL RECTAL MANOMETRY N/A 08/03/2021   Procedure: ANO RECTAL MANOMETRY;  Surgeon: Lindle Rhea, MD;  Location: [facility];  Service: Gastroenterology;  Laterality: N/A;   CARDIAC CATHETERIZATION     x2   CESAREAN SECTION N/A    Phreesia 11/24/2019   CHOLECYSTECTOMY N/A    Phreesia 11/24/2019   COLONOSCOPY     DILATATION & CURETTAGE/HYSTEROSCOPY WITH MYOSURE N/A 10/28/2021   Procedure: DILATATION & CURETTAGE/HYSTEROSCOPY;  Surgeon: Vernal Gold, MD;  Location: [facility];  Service: Gynecology;  Laterality: N/A;   DILATION AND CURETTAGE OF UTERUS     EYE SURGERY N/A    Cataracts removed Phreesia 11/24/2019   HIP SURGERY Left 1990   Hip dysplasia cleaned out per patient   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   LAPAROSCOPIC SALPINGO OOPHERECTOMY Bilateral 10/28/2021   Procedure: LAPAROSCOPIC SALPINGO OOPHORECTOMY WITH PELVIC WASHINGS;  Surgeon: Vernal Gold, MD;  Location: [facility];  Service: Gynecology;  Laterality: Bilateral;   POLYPECTOMY     HPP   TONSILLECTOMY     TRANSFORAMINAL LUMBAR INTERBODY FUSION (TLIF) WITH PEDICLE SCREW FIXATION 1 LEVEL N/A 12/01/2020   Procedure: Transiforaminal Lumbar Interbody Fusion Lumbar four--Lumbar five;  Surgeon: Van Gelinas, MD;  Location: [facility] OR;  Service: Neurosurgery;  Laterality: N/A;   TRIGGER FINGER RELEASE Right 10/28/2020   Long finger   VULVAR LESION REMOVAL N/A 10/28/2021   Procedure: COMPLETE RESECTION OF VULVAR WARTS;  Surgeon: Vernal Gold, MD;  Location: [facility];  Service: Gynecology;  Laterality: N/A;    Family History  Problem Relation Age of Onset   Diabetes Mother    Asthma Mother    Hypertension Father    Coronary artery disease Father    Colon cancer Neg Hx    Colon polyps Neg Hx    Esophageal cancer Neg Hx    Rectal cancer Neg Hx    Stomach cancer Neg Hx    Breast cancer Neg Hx    Ovarian cancer Neg Hx    Endometrial cancer Neg Hx    Pancreatic cancer Neg Hx    Prostate cancer Neg Hx     Social History   Socioeconomic History   Marital status: Widowed    Spouse name: Not on file   Number of children: Not on file   Years of education: Not on file   Highest education level: Associate degree: occupational, Scientist, product/process development, or vocational program  Occupational History   Not on file  Tobacco Use   Smoking status: Never   Smokeless tobacco: Never  Vaping Use   Vaping status: Never Used  Substance and Sexual Activity   Alcohol use: Never   Drug use: Never   Sexual activity: Not Currently    Birth control/protection: None  Other Topics Concern   Not on file  Social History Narrative   Not on file   Social Drivers of Health   Financial Resource Strain: Low Risk  (02/20/2023)   Overall Financial Resource Strain (CARDIA)    Difficulty of Paying Living Expenses: Not hard at all  Food Insecurity: No Food Insecurity (02/20/2023)   Hunger Vital Sign    Worried About  Running Out of Food in the Last Year: Never true    Ran Out of Food in the Last Year: Never true  Transportation Needs: No Transportation Needs (02/20/2023)   PRAPARE - Administrator, Civil Service (Medical): No    Lack of Transportation (Non-Medical): No  Physical Activity: Insufficiently Active (02/20/2023)   Exercise Vital Sign    Days of Exercise per Week: 3 days    Minutes of Exercise per Session: 20 min  Stress: No Stress Concern Present (02/20/2023)   [employer] of Occupational Health - Occupational Stress Questionnaire    Feeling of Stress : Only a little  Social Connections: Moderately Integrated (02/20/2023)   Social Connection and Isolation Panel [NHANES]    Frequency of Communication with Friends and Family: More than three times a week  Frequency of Social Gatherings with Friends and Family: Twice a week    Attends Religious Services: More than 4 times per year    Active Member of [employer] or Organizations: Yes    Attends Banker Meetings: More than 4 times per year    Marital Status: Widowed    Current Medications:  Current Outpatient Medications:    amLODipine  (NORVASC ) 5 MG tablet, Take 5 mg by mouth daily., Disp: , Rfl:    aspirin  EC 81 MG tablet, Take 81 mg by mouth daily. Swallow whole., Disp: , Rfl:    carvedilol  (COREG ) 12.5 MG tablet, Take 12.5 mg by mouth 2 (two) times daily with a meal., Disp: , Rfl:    cetirizine (ZYRTEC) 10 MG tablet, Take 10 mg by mouth daily., Disp: , Rfl:    Cholecalciferol  (VITAMIN D3) 10 MCG (400 UNIT) CAPS, Take 400 Units by mouth daily., Disp: , Rfl:    Coenzyme Q10 (CO Q10 PO), Take 1 capsule by mouth daily., Disp: , Rfl:    diclofenac  Sodium (VOLTAREN ) 1 % GEL, Apply 2 g topically 4 (four) times daily. (Patient taking differently: Apply 2 g topically daily as needed (pain).), Disp: 100 g, Rfl: 0   diphenhydramine -acetaminophen  (TYLENOL  PM) 25-500 MG TABS tablet, Take 1 tablet by mouth at bedtime as needed  (As needed)., Disp: , Rfl:    LORazepam  (ATIVAN ) 2 MG tablet, Take 2 mg by mouth 2 (two) times daily as needed., Disp: , Rfl:    losartan (COZAAR) 25 MG tablet, Take 25 mg by mouth daily., Disp: , Rfl:    magnesium  oxide (MAG-OX) 400 MG tablet, Take 400 mg by mouth daily., Disp: , Rfl:    mycophenolate  (MYFORTIC ) 180 MG EC tablet, Take 180 mg by mouth 2 (two) times daily., Disp: , Rfl:    rosuvastatin  (CRESTOR ) 20 MG tablet, Take 1 tablet (20 mg total) by mouth daily., Disp: 90 tablet, Rfl: 3   tacrolimus  (PROGRAF ) 1 MG capsule, TAKE 4 CAPSULE BY MOUTH EVERY MORNING AND 2 CAPSULE BY MOUTH EVERY EVENING, Disp: 540 capsule, Rfl: 1  Review of Systems: + Cough, bruising/bleeding easily. Denies appetite changes, fevers, chills, fatigue, unexplained weight changes. Denies hearing loss, neck lumps or masses, mouth sores, ringing in ears or voice changes. Denies wheezing.  Denies shortness of breath. Denies chest pain or palpitations. Denies leg swelling. Denies abdominal distention, pain, blood in stools, constipation, diarrhea, nausea, vomiting, or early satiety. Denies pain with intercourse, dysuria, frequency, hematuria or incontinence. Denies hot flashes, pelvic pain, vaginal bleeding or vaginal discharge.   Denies joint pain, back pain or muscle pain/cramps. Denies itching, rash, or wounds. Denies dizziness, headaches, numbness or seizures. Denies swollen lymph nodes or glands. Denies anxiety, depression, confusion, or decreased concentration.  Physical Exam: BP 134/70 (BP Location: Left Arm, Patient Position: Sitting)   Pulse 60   Temp 97.6 F (36.4 C) (Oral)   Resp 17   Ht 5\' 1"  (1.549 m)   Wt 185 lb (83.9 kg)   SpO2 100%   BMI 34.96 kg/m  General: Alert, oriented, no acute distress.  HEENT: Normocephalic, atraumatic. Sclera anicteric.  Chest: Clear to auscultation bilaterally. No wheezes, rhonchi, or rales. Cardiovascular: Regular rate and rhythm, no murmurs, rubs, or gallops.   Abdomen: Obese. Normoactive bowel sounds. Soft, nondistended, nontender to palpation. No masses or hepatosplenomegaly appreciated. No palpable fluid wave.  Well-healed laparoscopic incisions. Extremities: Grossly normal range of motion. Warm, well perfused. No edema bilaterally.  Skin: No rashes or lesions.  Lymphatics: No cervical, supraclavicular, or inguinal adenopathy.  GU:      Normal external female genitalia.  No lesions. No discharge or bleeding.             Bladder/urethra:  No lesions or masses, no significant prolapse noted.             Vagina: Mildly atrophic, no lesions noted.             Cervix: Normal appearing, no lesions.             Uterus: Small, mobile, no parametrial involvement or nodularity.             Adnexa: No masses appreciated.             Rectal: Deferred.  Laboratory & Radiologic Studies: CT A/P 10/2022:  Colonic diverticulosis.  Stable indeterminate left adrenal mass.  Assessment & Plan: Desare Duddy is a 73 y.o. 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 Surgery in 10/2021.  Patient is overall doing well.  NED on exam today.  Discussed getting inhibin B.  Having some symptoms suggestive of urinary retention versus pelvic floor dysfunction; ultimately able to void.  She does not have much prolapse noted on exam, but I offered urogynecology referral to further work this up.  She declines today but will let me know if she changes her mind or symptoms worsen.  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  I have asked her to reach out to her OB/GYN to schedule a visit in 6 months and she will return to see us  in 1 year.   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  No lesions noted on exam today.  20 minutes of total time was spent for this patient  encounter, including preparation, face-to-face counseling with the patient and coordination of care, and documentation of the encounter.  Wiley Hanger, MD  Division of Gynecologic Oncology  Department of Obstetrics and Gynecology  [facility]

## 2023-10-16 ENCOUNTER — Ambulatory Visit (INDEPENDENT_AMBULATORY_CARE_PROVIDER_SITE_OTHER): Admitting: Physician Assistant

## 2023-10-16 ENCOUNTER — Ambulatory Visit: Payer: Self-pay | Admitting: Gynecologic Oncology

## 2023-10-16 DIAGNOSIS — M2022 Hallux rigidus, left foot: Secondary | ICD-10-CM

## 2023-10-16 LAB — INHIBIN B: Inhibin B: <7 pg/mL (ref 0.0–16.9)

## 2023-10-16 NOTE — Progress Notes (Signed)
 Office Visit Note   Patient: Megan Bradford           Date of Birth: Apr 29, 1951           MRN: [id number] Visit Date: 10/16/2023              Requested by: Elvira Hammersmith, MD [address] [place],  [place] [address] PCP: Elvira Hammersmith, MD   Assessment & Plan: Visit Diagnoses:  1. Hallux rigidus, left foot     Plan: Impression is left foot great toe hallux rigidus.  Patient has tried and failed conservative treatment to include over-the-counter pain medication, soaks, stiff shoewear.  Due to lack of relief from conservative treatments I have recommended arthrodesis of the great toe MTP joint.  Detailed surgical plan including risk benefits prognosis reviewed.  Questions encouraged and answered.  Stephenie Einstein will call her to confirm surgery date.  Follow-Up Instructions: Return for post-op.   Orders:  No orders of the defined types were placed in this encounter.  No orders of the defined types were placed in this encounter.     Procedures: No procedures performed   Clinical Data: No additional findings.   Subjective: Chief Complaint  Patient presents with   Left Foot - Pain    HPI patient is a pleasant 73-year-old female who comes in today with continued left great toe pain.  She has a history of hallux rigidus which has been bothering her for the past 3 years and is progressively worsening.  Pain is worse when she is bending the toe such as when she is reaching up to grab something.  She has been taking over-the-counter pain medication as well as using soaks without relief.  She has recently bought a pair of Birkenstocks which does not seem to help.  No previous cortisone injection to that joint.  At this point, she is ready to schedule surgery.  Review of Systems  Constitutional: Negative.   HENT: Negative.    Eyes: Negative.   Respiratory: Negative.    Cardiovascular: Negative.   Endocrine: Negative.   Musculoskeletal: Negative.    Neurological: Negative.   Hematological: Negative.   Psychiatric/Behavioral: Negative.    All other systems reviewed and are negative.  as detailed in HPI.  All others reviewed and are negative.   Objective: Vital Signs: There were no vitals taken for this visit.  Physical Exam Vitals and nursing note reviewed.  Constitutional:      Appearance: She is well-developed.  HENT:     Head: Atraumatic.     Nose: Nose normal.  Eyes:     Extraocular Movements: Extraocular movements intact.  Cardiovascular:     Pulses: Normal pulses.  Pulmonary:     Effort: Pulmonary effort is normal.  Abdominal:     Palpations: Abdomen is soft.  Musculoskeletal:     Cervical back: Neck supple.  Skin:    General: Skin is warm.     Capillary Refill: Capillary refill takes less than 2 seconds.  Neurological:     Mental Status: She is alert. Mental status is at baseline.  Psychiatric:        Behavior: Behavior normal.        Thought Content: Thought content normal.        Judgment: Judgment normal.    well-developed well-nourished female in no acute distress.  Alert and oriented x 3.  Ortho Exam  Examination of the left great toe shows pain with MTP grind test throughout full  range of motion.  Tenderness to palpation of the joint.   PMFS History: Patient Active Problem List   Diagnosis Date Noted   Hallux rigidus, left foot 09/18/2023   Primary osteoarthritis of right knee 09/18/2023   Hypomagnesemia 11/11/2022   Allergic rhinitis 11/11/2022   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   Adrenal mass, 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   Diverticulosis 02/02/2022   Hospital discharge follow-up 10/20/2021   Dyslipidemia 10/20/2021   Chronic diastolic CHF (congestive heart failure) (HCC) 10/14/2021   Hyperlipidemia 10/14/2021   Dyssynergic defecation    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/01/2020   Coronary artery disease involving native coronary artery of native heart 08/30/2020   Immunosuppression (HCC)  08/30/2020   CKD stage 3b, GFR 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) 12/17/2019   History of kidney transplant 11/27/2019   Essential hypertension 11/27/2019   Prediabetes 11/27/2019   Obesity (BMI 30-39.9) 11/27/2019   Past Medical History:  Diagnosis Date   Cataract    Phreesia 11/24/2019- removed both   Chronic kidney disease    Phreesia 11/24/2019   Clotting disorder (HCC)    PE 2010 per pt there really was not a PE   Fistula    LUE; was on dialysis for 11 months   Heart murmur    Hypertension    Phreesia 11/24/2019   Kidney transplanted    transplant team [facility] [place])   Pre-diabetes    no meds   Pulmonary embolism (HCC) 2010   never symptomatic    Family History  Problem Relation Age of Onset   Diabetes Mother    Asthma Mother    Hypertension Father    Coronary artery disease Father    Colon cancer Neg Hx    Colon polyps Neg Hx    Esophageal cancer Neg Hx    Rectal cancer Neg Hx    Stomach cancer Neg Hx    Breast cancer Neg Hx    Ovarian cancer Neg Hx    Endometrial cancer Neg Hx    Pancreatic cancer Neg Hx    Prostate cancer Neg Hx     Past Surgical History:  Procedure Laterality Date   ANAL RECTAL MANOMETRY N/A 08/03/2021   Procedure: ANO RECTAL MANOMETRY;  Surgeon: Lindle Rhea, MD;  Location: [facility];  Service: Gastroenterology;  Laterality: N/A;   CARDIAC CATHETERIZATION     x2   CESAREAN SECTION N/A    Phreesia 11/24/2019   CHOLECYSTECTOMY N/A    Phreesia 11/24/2019   COLONOSCOPY     DILATATION & CURETTAGE/HYSTEROSCOPY WITH MYOSURE N/A 10/28/2021   Procedure: DILATATION & CURETTAGE/HYSTEROSCOPY;  Surgeon: Vernal Gold, MD;  Location: [facility];  Service: Gynecology;  Laterality: N/A;   DILATION AND CURETTAGE OF UTERUS     EYE SURGERY N/A    Cataracts removed Phreesia 11/24/2019   HIP SURGERY Left 1990   Hip dysplasia cleaned out per patient   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   LAPAROSCOPIC SALPINGO OOPHERECTOMY Bilateral 10/28/2021    Procedure: LAPAROSCOPIC SALPINGO OOPHORECTOMY WITH PELVIC WASHINGS;  Surgeon: Vernal Gold, MD;  Location: [facility];  Service: Gynecology;  Laterality: Bilateral;   POLYPECTOMY     HPP   TONSILLECTOMY     TRANSFORAMINAL LUMBAR INTERBODY FUSION (TLIF) WITH PEDICLE SCREW FIXATION 1 LEVEL N/A 12/01/2020   Procedure: Transiforaminal Lumbar Interbody Fusion Lumbar four--Lumbar five;  Surgeon: Van Gelinas, MD;  Location: [facility];  Service: Neurosurgery;  Laterality: N/A;   TRIGGER FINGER RELEASE Right 10/28/2020  Long finger   VULVAR LESION REMOVAL N/A 10/28/2021   Procedure: COMPLETE RESECTION OF VULVAR WARTS;  Surgeon: Vernal Gold, MD;  Location: [facility];  Service: Gynecology;  Laterality: N/A;   Social History   Occupational History   Not on file  Tobacco Use   Smoking status: Never   Smokeless tobacco: Never  Vaping Use   Vaping status: Never Used  Substance and Sexual Activity   Alcohol use: Never   Drug use: Never   Sexual activity: Not Currently    Birth control/protection: None

## 2023-10-17 DIAGNOSIS — M4316 Spondylolisthesis, lumbar region: Secondary | ICD-10-CM | POA: Diagnosis not present

## 2023-10-17 NOTE — Telephone Encounter (Signed)
 Pt is aware of normal Inhibin B results as reported by Vira Grieves NP

## 2023-10-17 NOTE — Progress Notes (Signed)
 Please let her know lab test was negative/normal

## 2023-10-17 NOTE — Telephone Encounter (Signed)
-----   Message from Nurse Jeannetta Millman sent at 10/17/2023  3:18 PM EDT -----  ----- Message ----- From: Suzi Essex, MD Sent: 10/17/2023   8:58 AM EDT To: Louella Rout, RN  Please let her know lab test was negative/normal

## 2023-10-30 ENCOUNTER — Telehealth: Payer: Self-pay

## 2023-10-30 ENCOUNTER — Other Ambulatory Visit: Payer: Self-pay | Admitting: Physician Assistant

## 2023-10-30 MED ORDER — ONDANSETRON HCL 4 MG PO TABS: 4.0000 mg | ORAL_TABLET | Freq: Three times a day (TID) | ORAL | 0 refills | Status: DC | PRN

## 2023-10-30 MED ORDER — ASPIRIN 81 MG PO CHEW: 81.0000 mg | CHEWABLE_TABLET | Freq: Two times a day (BID) | ORAL | 0 refills | Status: DC

## 2023-10-30 MED ORDER — HYDROCODONE-ACETAMINOPHEN 5-325 MG PO TABS: 1.0000 | ORAL_TABLET | Freq: Three times a day (TID) | ORAL | 0 refills | Status: AC | PRN

## 2023-10-30 NOTE — Telephone Encounter (Signed)
 Patient is scheduled for foot surgery on 11/07/2023. She needs to come by and pick up a Darco shoe that she will take to surgery with her. She wears a size 8 in regular shoes. She will call us  back to schedule a nurse visit appointment, and get the shoe then.

## 2023-10-31 ENCOUNTER — Encounter (HOSPITAL_BASED_OUTPATIENT_CLINIC_OR_DEPARTMENT_OTHER): Payer: Self-pay | Admitting: Orthopaedic Surgery

## 2023-10-31 ENCOUNTER — Ambulatory Visit

## 2023-10-31 ENCOUNTER — Other Ambulatory Visit: Payer: Self-pay

## 2023-10-31 DIAGNOSIS — M2022 Hallux rigidus, left foot: Secondary | ICD-10-CM | POA: Diagnosis not present

## 2023-10-31 NOTE — Progress Notes (Signed)
 Patient came in and was fitted with a size medium darco shoe. She is aware that she needs to take this with her to surgery next week.

## 2023-11-01 ENCOUNTER — Telehealth: Payer: Self-pay

## 2023-11-01 NOTE — Telephone Encounter (Signed)
   Pre-operative Risk Assessment    Patient Name: Megan Bradford  DOB: 11/05/1950 MRN: [id number]   Date of last office visit: 12/20/22 Date of next office visit: 11/19/23   Request for Surgical Clearance    Procedure:  left great toe arthrodesis   Date of Surgery:  Clearance 11/07/23                                 Surgeon:  Dyana Glade, MD  Surgeon's Group or Practice Name:  [facility]  Phone number:  [phone] Fax number:  [phone]   Type of Clearance Requested:   - Medical  - Pharmacy:  Hold Aspirin  Not indicated    Type of Anesthesia:  General & block anesthesia    Additional requests/questions:    SignedZulema Hitchcock   11/01/2023, 4:37 PM

## 2023-11-01 NOTE — Telephone Encounter (Signed)
   Name: Megan Bradford  DOB: Nov 19, 1950  MRN: [id number]  Primary Cardiologist: Janelle Mediate, MD   Preoperative team, please contact this patient and set up a phone call appointment for further preoperative risk assessment. Please obtain consent and complete medication review. Thank you for your help.  I confirm that guidance regarding antiplatelet and oral anticoagulation therapy has been completed and, if necessary, noted below.  Patient's aspirin  is not prescribed by cardiology.  Recommendations for holding aspirin  will need to come from prescribing provider.  I also confirmed the patient resides in the state of [place] . As per [facility] Medical Board telemedicine laws, the patient must reside in the state in which the provider is licensed.   Carie Charity, NP 11/01/2023, 5:00 PM [facility]

## 2023-11-02 ENCOUNTER — Telehealth: Payer: Self-pay | Admitting: *Deleted

## 2023-11-02 NOTE — Telephone Encounter (Signed)
 I d/w the preop APP Lawana Pray, FNP as to where to add pt on for tele preop appt due to procedure 11/07/23.  Pt has been added on Monday 11/05/23 per Lawana Pray, FNP due to procedure date 11/07/23. Med rec and consent are done.

## 2023-11-02 NOTE — Telephone Encounter (Signed)
 Pt has been added on Monday 11/05/23 per Lawana Pray, FNP due to procedure date 11/07/23. Med rec and consent are done.     Patient Consent for Virtual Visit        Megan Bradford has provided verbal consent on 11/02/2023 for a virtual visit (video or telephone).   CONSENT FOR VIRTUAL VISIT FOR:  [name]  By participating in this virtual visit I agree to the following:  I hereby voluntarily request, consent and authorize [facility] and its employed or contracted physicians, physician assistants, nurse practitioners or other licensed health care professionals (the Practitioner), to provide me with telemedicine health care services (the "Services) as deemed necessary by the treating Practitioner. I acknowledge and consent to receive the Services by the Practitioner via telemedicine. I understand that the telemedicine visit will involve communicating with the Practitioner through live audiovisual communication technology and the disclosure of certain medical information by electronic transmission. I acknowledge that I have been given the opportunity to request an in-person assessment or other available alternative prior to the telemedicine visit and am voluntarily participating in the telemedicine visit.  I understand that I have the right to withhold or withdraw my consent to the use of telemedicine in the course of my care at any time, without affecting my right to future care or treatment, and that the Practitioner or I may terminate the telemedicine visit at any time. I understand that I have the right to inspect all information obtained and/or recorded in the course of the telemedicine visit and may receive copies of available information for a reasonable fee.  I understand that some of the potential risks of receiving the Services via telemedicine include:  Delay or interruption in medical evaluation due to technological equipment failure or disruption; Information  transmitted may not be sufficient (e.g. poor resolution of images) to allow for appropriate medical decision making by the Practitioner; and/or  In rare instances, security protocols could fail, causing a breach of personal health information.  Furthermore, I acknowledge that it is my responsibility to provide information about my medical history, conditions and care that is complete and accurate to the best of my ability. I acknowledge that Practitioner's advice, recommendations, and/or decision may be based on factors not within their control, such as incomplete or inaccurate data provided by me or distortions of diagnostic images or specimens that may result from electronic transmissions. I understand that the practice of medicine is not an exact science and that Practitioner makes no warranties or guarantees regarding treatment outcomes. I acknowledge that a copy of this consent can be made available to me via my patient portal [facility] MyChart), or I can request a printed copy by calling the office of [facility].    I understand that my insurance will be billed for this visit.   I have read or had this consent read to me. I understand the contents of this consent, which adequately explains the benefits and risks of the Services being provided via telemedicine.  I have been provided ample opportunity to ask questions regarding this consent and the Services and have had my questions answered to my satisfaction. I give my informed consent for the services to be provided through the use of telemedicine in my medical care

## 2023-11-05 ENCOUNTER — Ambulatory Visit: Attending: Internal Medicine

## 2023-11-05 DIAGNOSIS — Z01810 Encounter for preprocedural cardiovascular examination: Secondary | ICD-10-CM

## 2023-11-05 NOTE — Progress Notes (Signed)
 Virtual Visit via Telephone Note   Because of Megan Bradford co-morbid illnesses, she is at least at moderate risk for complications without adequate follow up.  This format is felt to be most appropriate for this patient at this time.  Due to technical limitations with video connection (technology), today's appointment will be conducted as an audio only telehealth visit, and Megan Bradford verbally agreed to proceed in this manner.   All issues noted in this document were discussed and addressed.  No physical exam could be performed with this format.  Evaluation Performed:  Preoperative cardiovascular risk assessment _____________   Date:  11/05/2023   Patient ID:  Megan Bradford, DOB 1950/11/27, MRN [id number] Patient Location:  Home Provider location:   Office  Primary Care Provider:  Purcell Emil Schanz, MD Primary Cardiologist:  Maude Emmer, MD  Chief Complaint / Patient Profile   73 y.o. y/o female with a h/o essential hypertension, coronary artery disease, chronic diastolic CHF who is pending left great toe arthrodesis and presents today for telephonic preoperative cardiovascular risk assessment.  History of Present Illness    Megan Bradford is a 73 y.o. female who presents via audio/video conferencing for a telehealth visit today.  Pt was last seen in cardiology clinic on 12/20/2022 by Dr. Emmer.  At that time Megan Bradford was doing well .  The patient is now pending procedure as outlined above. Since her last visit, she continues to be stable from a cardiac standpoint.  Today she denies chest pain, shortness of breath, lower extremity edema, fatigue, palpitations, melena, hematuria, hemoptysis, diaphoresis, weakness, presyncope, syncope, orthopnea, and PND.   Past Medical History    Past Medical History:  Diagnosis Date   Cataract    Phreesia 11/24/2019- removed both   Chronic kidney disease    Phreesia 11/24/2019   Clotting disorder (HCC)    PE  2010 per pt there really was not a PE   Fistula    LUE; was on dialysis for 11 months   Heart murmur    Hypertension    Phreesia 11/24/2019   Kidney transplanted    transplant team [facility], [place])   Pre-diabetes    no meds   Pulmonary embolism (HCC) 2010   never symptomatic   Past Surgical History:  Procedure Laterality Date   ANAL RECTAL MANOMETRY N/A 08/03/2021   Procedure: ANO RECTAL MANOMETRY;  Surgeon: Eda Iha, MD;  Location: [facility];  Service: Gastroenterology;  Laterality: N/A;   CARDIAC CATHETERIZATION     x2   CESAREAN SECTION N/A    Phreesia 11/24/2019   CHOLECYSTECTOMY N/A    Phreesia 11/24/2019   COLONOSCOPY     DILATATION & CURETTAGE/HYSTEROSCOPY WITH MYOSURE N/A 10/28/2021   Procedure: DILATATION & CURETTAGE/HYSTEROSCOPY;  Surgeon: Gloriann Chick, MD;  Location: [facility];  Service: Gynecology;  Laterality: N/A;   DILATION AND CURETTAGE OF UTERUS     EYE SURGERY N/A    Cataracts removed Phreesia 11/24/2019   HIP SURGERY Left 1990   Hip dysplasia cleaned out per patient   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   LAPAROSCOPIC SALPINGO OOPHERECTOMY Bilateral 10/28/2021   Procedure: LAPAROSCOPIC SALPINGO OOPHORECTOMY WITH PELVIC WASHINGS;  Surgeon: Gloriann Chick, MD;  Location: [facility];  Service: Gynecology;  Laterality: Bilateral;   POLYPECTOMY     HPP   TONSILLECTOMY     TRANSFORAMINAL LUMBAR INTERBODY FUSION (TLIF) WITH PEDICLE SCREW FIXATION 1 LEVEL N/A 12/01/2020  Procedure: Transiforaminal Lumbar Interbody Fusion Lumbar four--Lumbar five;  Surgeon: Debby Dorn MATSU, MD;  Location: [facility];  Service: Neurosurgery;  Laterality: N/A;   TRIGGER FINGER RELEASE Right 10/28/2020   Long finger   VULVAR LESION REMOVAL N/A 10/28/2021   Procedure: COMPLETE RESECTION OF VULVAR WARTS;  Surgeon: Gloriann Chick, MD;  Location: [facility];  Service: Gynecology;  Laterality: N/A;    Allergies  Allergies  Allergen  Reactions   Keflex [Cephalexin] Anaphylaxis    Home Medications    Prior to Admission medications   Medication Sig Start Date End Date Taking? Authorizing Provider  aspirin  (ASPIRIN  81) 81 MG chewable tablet Chew 1 tablet (81 mg total) by mouth 2 (two) times daily. Take one tab po bid x 6 weeks post-op to prevent blood clots 10/30/23   Jule Ronal CROME, [name]  amLODipine  (NORVASC ) 5 MG tablet Take 5 mg by mouth daily.    [provider]  aspirin  EC 81 MG tablet Take 81 mg by mouth daily. Swallow whole.    [provider]  carvedilol  (COREG ) 12.5 MG tablet Take 12.5 mg by mouth 2 (two) times daily with a meal.    [provider]  cetirizine (ZYRTEC) 10 MG tablet Take 10 mg by mouth daily.    [provider]  Cholecalciferol  (VITAMIN D3) 10 MCG (400 UNIT) CAPS Take 400 Units by mouth daily.    [provider]  Coenzyme Q10 (CO Q10 PO) Take 1 capsule by mouth daily.    [provider]  diclofenac  Sodium (VOLTAREN ) 1 % GEL Apply 2 g topically 4 (four) times daily. 12/01/19   Wieters, Hallie C, [name]  diphenhydramine -acetaminophen  (TYLENOL  PM) 25-500 MG TABS tablet Take 1 tablet by mouth at bedtime as needed (As needed).    [provider]  HYDROcodone -acetaminophen  (NORCO/VICODIN) 5-325 MG tablet Take 1 tablet by mouth 3 (three) times daily as needed for moderate pain (pain score 4-6). 10/30/23   Jule Ronal CROME, [name]  LORazepam  (ATIVAN ) 2 MG tablet Take 2 mg by mouth 2 (two) times daily as needed. 01/10/22   [provider]  losartan (COZAAR) 25 MG tablet Take 25 mg by mouth daily. 12/19/22   [provider]  magnesium  oxide (MAG-OX) 400 MG tablet Take 400 mg by mouth daily.    [provider]  mycophenolate  (MYFORTIC ) 180 MG EC tablet Take 180 mg by mouth 2 (two) times daily.    [provider]  ondansetron  (ZOFRAN ) 4 MG tablet Take 1 tablet (4 mg total) by mouth every 8 (eight) hours as needed for nausea  or vomiting. 10/30/23   Jule Ronal CROME, [name]  rosuvastatin  (CRESTOR ) 20 MG tablet Take 1 tablet (20 mg total) by mouth daily. 12/20/22   Delford Maude BROCKS, MD  tacrolimus  (PROGRAF ) 1 MG capsule TAKE 4 CAPSULE BY MOUTH EVERY MORNING AND 2 CAPSULE BY MOUTH EVERY EVENING 09/20/23   Sagardia, Miguel Jose, MD    Physical Exam    Vital Signs:  Megan Bradford does not have vital signs available for review today.  Given telephonic nature of communication, physical exam is limited. AAOx3. NAD. Normal affect.  Speech and respirations are unlabored.  Accessory Clinical Findings    None  Assessment & Plan    1.  Preoperative Cardiovascular Risk Assessment: Procedure:  left great toe arthrodesis    Date of Surgery:  [place] 11/07/23  Surgeon:  Jerri Moody, MD  Surgeon's Group or Practice Name:  [facility] number:  [phone] Fax number:  [phone]      Primary Cardiologist: Maude Emmer, MD  Chart reviewed as part of pre-operative protocol coverage. Given past medical history and time since last visit, based on ACC/AHA guidelines, Megan Bradford would be at acceptable risk for the planned procedure without further cardiovascular testing.   Her RCRI is low risk, 0.9% risk of major cardiac event.  She is able to complete greater than 4 METS of physical activity.  Patient was advised that if she develops new symptoms prior to surgery to contact our office to arrange a follow-up appointment.  He verbalized understanding.  I will route this recommendation to the requesting party via Epic fax function and remove from pre-op pool.       Time:   Today, I have spent 7 minutes with the patient with telehealth technology discussing medical history, symptoms, and management plan.  I spent 10 minutes reviewing patient's past cardiac history and cardiac medications.    Megan CHRISTELLA Beauvais, NP  11/05/2023, 6:59 AM

## 2023-11-06 ENCOUNTER — Encounter (HOSPITAL_BASED_OUTPATIENT_CLINIC_OR_DEPARTMENT_OTHER)
Admission: RE | Admit: 2023-11-06 | Discharge: 2023-11-06 | Disposition: A | Discharge status: Home / Self Care | Source: Ambulatory Visit | Attending: Orthopaedic Surgery | Admitting: Orthopaedic Surgery

## 2023-11-06 DIAGNOSIS — I509 Heart failure, unspecified: Secondary | ICD-10-CM | POA: Diagnosis not present

## 2023-11-06 DIAGNOSIS — I2510 Atherosclerotic heart disease of native coronary artery without angina pectoris: Secondary | ICD-10-CM | POA: Diagnosis not present

## 2023-11-06 DIAGNOSIS — E66813 Obesity, class 3: Secondary | ICD-10-CM | POA: Diagnosis not present

## 2023-11-06 DIAGNOSIS — Z79899 Other long term (current) drug therapy: Secondary | ICD-10-CM | POA: Diagnosis not present

## 2023-11-06 DIAGNOSIS — Z6835 Body mass index (BMI) 35.0-35.9, adult: Secondary | ICD-10-CM | POA: Diagnosis not present

## 2023-11-06 DIAGNOSIS — I110 Hypertensive heart disease with heart failure: Secondary | ICD-10-CM | POA: Diagnosis not present

## 2023-11-06 DIAGNOSIS — M2022 Hallux rigidus, left foot: Secondary | ICD-10-CM | POA: Diagnosis not present

## 2023-11-06 LAB — BASIC METABOLIC PANEL WITH GFR
Anion gap: 13 (ref 5–15)
BUN: 20 mg/dL (ref 8–23)
CO2: 24 mmol/L (ref 22–32)
Calcium: 9.4 mg/dL (ref 8.9–10.3)
Chloride: 104 mmol/L (ref 98–111)
Creatinine, Ser: 2.04 mg/dL — ABNORMAL HIGH (ref 0.44–1.00)
GFR, Estimated: 26 mL/min — ABNORMAL LOW (ref >60)
Glucose, Bld: 115 mg/dL — ABNORMAL HIGH (ref 70–99)
Potassium: 3.9 mmol/L (ref 3.5–5.1)
Sodium: 141 mmol/L (ref 135–145)

## 2023-11-06 NOTE — Progress Notes (Signed)
 RN sent message to the pt to come in today to get her lab work completed and ERAS drink.

## 2023-11-06 NOTE — Progress Notes (Signed)

## 2023-11-07 ENCOUNTER — Encounter (HOSPITAL_BASED_OUTPATIENT_CLINIC_OR_DEPARTMENT_OTHER): Payer: Self-pay | Admitting: Orthopaedic Surgery

## 2023-11-07 ENCOUNTER — Ambulatory Visit (HOSPITAL_BASED_OUTPATIENT_CLINIC_OR_DEPARTMENT_OTHER): Admitting: Anesthesiology

## 2023-11-07 ENCOUNTER — Other Ambulatory Visit: Payer: Self-pay

## 2023-11-07 ENCOUNTER — Encounter (HOSPITAL_BASED_OUTPATIENT_CLINIC_OR_DEPARTMENT_OTHER)
Admission: RE | Disposition: A | Discharge status: Home / Self Care | Payer: Self-pay | Source: Home / Self Care | Attending: Orthopaedic Surgery

## 2023-11-07 ENCOUNTER — Ambulatory Visit (HOSPITAL_BASED_OUTPATIENT_CLINIC_OR_DEPARTMENT_OTHER)

## 2023-11-07 ENCOUNTER — Ambulatory Visit (HOSPITAL_BASED_OUTPATIENT_CLINIC_OR_DEPARTMENT_OTHER)
Admission: RE | Admit: 2023-11-07 | Discharge: 2023-11-07 | Disposition: A | Discharge status: Home / Self Care | Attending: Orthopaedic Surgery | Admitting: Orthopaedic Surgery

## 2023-11-07 DIAGNOSIS — I5032 Chronic diastolic (congestive) heart failure: Secondary | ICD-10-CM | POA: Diagnosis not present

## 2023-11-07 DIAGNOSIS — I130 Hypertensive heart and chronic kidney disease with heart failure and stage 1 through stage 4 chronic kidney disease, or unspecified chronic kidney disease: Secondary | ICD-10-CM | POA: Diagnosis not present

## 2023-11-07 DIAGNOSIS — G8918 Other acute postprocedural pain: Secondary | ICD-10-CM | POA: Diagnosis not present

## 2023-11-07 DIAGNOSIS — N1832 Chronic kidney disease, stage 3b: Secondary | ICD-10-CM

## 2023-11-07 DIAGNOSIS — M2022 Hallux rigidus, left foot: Secondary | ICD-10-CM | POA: Insufficient documentation

## 2023-11-07 DIAGNOSIS — I110 Hypertensive heart disease with heart failure: Secondary | ICD-10-CM | POA: Diagnosis not present

## 2023-11-07 DIAGNOSIS — I509 Heart failure, unspecified: Secondary | ICD-10-CM | POA: Diagnosis not present

## 2023-11-07 DIAGNOSIS — Z6835 Body mass index (BMI) 35.0-35.9, adult: Secondary | ICD-10-CM | POA: Insufficient documentation

## 2023-11-07 DIAGNOSIS — Z79899 Other long term (current) drug therapy: Secondary | ICD-10-CM | POA: Insufficient documentation

## 2023-11-07 DIAGNOSIS — Z01818 Encounter for other preprocedural examination: Secondary | ICD-10-CM

## 2023-11-07 DIAGNOSIS — E66813 Obesity, class 3: Secondary | ICD-10-CM | POA: Insufficient documentation

## 2023-11-07 DIAGNOSIS — I2510 Atherosclerotic heart disease of native coronary artery without angina pectoris: Secondary | ICD-10-CM | POA: Diagnosis not present

## 2023-11-07 HISTORY — PX: ARTHRODESIS METATARSALPHALANGEAL JOINT (MTPJ): SHX6566

## 2023-11-07 SURGERY — FUSION, JOINT, GREAT TOE
Anesthesia: General | Site: Toe | Laterality: Left

## 2023-11-07 MED ORDER — HYDROMORPHONE HCL 1 MG/ML IJ SOLN: 0.2500 mg | INTRAMUSCULAR | Status: DC | PRN

## 2023-11-07 MED ORDER — LABETALOL HCL 5 MG/ML IV SOLN
5.0000 mg | INTRAVENOUS | Status: DC | PRN
  Administered 2023-11-07 (×2): 5 mg via INTRAVENOUS

## 2023-11-07 MED ORDER — LABETALOL HCL 5 MG/ML IV SOLN
INTRAVENOUS | Status: AC
  Filled 2023-11-07: qty 4

## 2023-11-07 MED ORDER — AMISULPRIDE (ANTIEMETIC) 5 MG/2ML IV SOLN
10.0000 mg | Freq: Once | INTRAVENOUS | Status: AC | PRN
  Administered 2023-11-07: 10 mg via INTRAVENOUS

## 2023-11-07 MED ORDER — LEVOFLOXACIN IN D5W 500 MG/100ML IV SOLN
INTRAVENOUS | Status: AC
  Filled 2023-11-07: qty 100

## 2023-11-07 MED ORDER — FENTANYL CITRATE (PF) 100 MCG/2ML IJ SOLN
INTRAMUSCULAR | Status: AC
  Filled 2023-11-07: qty 2

## 2023-11-07 MED ORDER — 0.9 % SODIUM CHLORIDE (POUR BTL) OPTIME
TOPICAL | Status: DC | PRN
  Administered 2023-11-07: 1000 mL

## 2023-11-07 MED ORDER — FENTANYL CITRATE (PF) 100 MCG/2ML IJ SOLN
INTRAMUSCULAR | Status: DC | PRN
  Administered 2023-11-07: 50 ug via INTRAVENOUS

## 2023-11-07 MED ORDER — AMISULPRIDE (ANTIEMETIC) 5 MG/2ML IV SOLN
INTRAVENOUS | Status: AC
  Filled 2023-11-07: qty 2

## 2023-11-07 MED ORDER — FENTANYL CITRATE (PF) 100 MCG/2ML IJ SOLN
100.0000 ug | Freq: Once | INTRAMUSCULAR | Status: AC
  Administered 2023-11-07: 100 ug via INTRAVENOUS

## 2023-11-07 MED ORDER — HYDRALAZINE HCL 20 MG/ML IJ SOLN
INTRAMUSCULAR | Status: AC
  Filled 2023-11-07: qty 1

## 2023-11-07 MED ORDER — LACTATED RINGERS IV SOLN: INTRAVENOUS | Status: DC

## 2023-11-07 MED ORDER — PROPOFOL 10 MG/ML IV BOLUS
INTRAVENOUS | Status: DC | PRN
  Administered 2023-11-07: 140 mg via INTRAVENOUS

## 2023-11-07 MED ORDER — DEXAMETHASONE SODIUM PHOSPHATE 4 MG/ML IJ SOLN
INTRAMUSCULAR | Status: DC | PRN
  Administered 2023-11-07: 5 mg via INTRAVENOUS

## 2023-11-07 MED ORDER — LABETALOL HCL 5 MG/ML IV SOLN
INTRAVENOUS | Status: DC | PRN
Start: 2023-11-07 — End: 2023-11-07
  Administered 2023-11-07: 5 mg via INTRAVENOUS

## 2023-11-07 MED ORDER — LIDOCAINE 2% (20 MG/ML) 5 ML SYRINGE
INTRAMUSCULAR | Status: DC | PRN
  Administered 2023-11-07: 80 mg via INTRAVENOUS

## 2023-11-07 MED ORDER — LABETALOL HCL 5 MG/ML IV SOLN
INTRAVENOUS | Status: AC
Start: 2023-11-07 — End: 2023-11-07
  Filled 2023-11-07: qty 4

## 2023-11-07 MED ORDER — HYDRALAZINE HCL 20 MG/ML IJ SOLN
5.0000 mg | Freq: Once | INTRAMUSCULAR | Status: AC
  Administered 2023-11-07: 5 mg via INTRAVENOUS

## 2023-11-07 MED ORDER — ROPIVACAINE HCL 5 MG/ML IJ SOLN
INTRAMUSCULAR | Status: DC | PRN
  Administered 2023-11-07: 30 mL via PERINEURAL

## 2023-11-07 MED ORDER — MIDAZOLAM HCL 2 MG/2ML IJ SOLN
INTRAMUSCULAR | Status: AC
  Filled 2023-11-07: qty 2

## 2023-11-07 MED ORDER — PROPOFOL 10 MG/ML IV BOLUS
INTRAVENOUS | Status: AC
  Filled 2023-11-07: qty 20

## 2023-11-07 MED ORDER — OXYCODONE HCL 5 MG/5ML PO SOLN: 5.0000 mg | Freq: Once | ORAL | Status: DC | PRN

## 2023-11-07 MED ORDER — PHENYLEPHRINE 80 MCG/ML (10ML) SYRINGE FOR IV PUSH (FOR BLOOD PRESSURE SUPPORT)
PREFILLED_SYRINGE | INTRAVENOUS | Status: AC
  Filled 2023-11-07: qty 10

## 2023-11-07 MED ORDER — OXYCODONE HCL 5 MG PO TABS: 5.0000 mg | ORAL_TABLET | Freq: Once | ORAL | Status: DC | PRN

## 2023-11-07 MED ORDER — LEVOFLOXACIN IN D5W 500 MG/100ML IV SOLN
500.0000 mg | INTRAVENOUS | Status: AC
  Administered 2023-11-07: 500 mg via INTRAVENOUS

## 2023-11-07 MED ORDER — PHENYLEPHRINE HCL (PRESSORS) 10 MG/ML IV SOLN
INTRAVENOUS | Status: DC | PRN
  Administered 2023-11-07: 80 ug via INTRAVENOUS
  Administered 2023-11-07: 40 ug via INTRAVENOUS
  Administered 2023-11-07: 80 ug via INTRAVENOUS
  Administered 2023-11-07 (×3): 120 ug via INTRAVENOUS
  Administered 2023-11-07: 80 ug via INTRAVENOUS

## 2023-11-07 MED ORDER — ONDANSETRON HCL 4 MG/2ML IJ SOLN
INTRAMUSCULAR | Status: DC | PRN
  Administered 2023-11-07: 4 mg via INTRAVENOUS

## 2023-11-07 MED ORDER — MIDAZOLAM HCL 2 MG/2ML IJ SOLN
1.0000 mg | Freq: Once | INTRAMUSCULAR | Status: AC
  Administered 2023-11-07: 1 mg via INTRAVENOUS

## 2023-11-07 MED ORDER — SODIUM CHLORIDE 0.9 % IV SOLN: 12.5000 mg | INTRAVENOUS | Status: DC | PRN

## 2023-11-07 SURGICAL SUPPLY — 56 items
BANDAGE ESMARK 6X9 LF (GAUZE/BANDAGES/DRESSINGS) ×1 IMPLANT
BIT DRILL 2 FAST STEP (BIT) IMPLANT
BLADE SURG 15 STRL LF DISP TIS (BLADE) ×2 IMPLANT
BNDG COHESIVE 4X5 TAN STRL LF (GAUZE/BANDAGES/DRESSINGS) ×1 IMPLANT
BNDG ELASTIC 4INX 5YD STR LF (GAUZE/BANDAGES/DRESSINGS) IMPLANT
BNDG STRETCH GAUZE 3IN X12FT (GAUZE/BANDAGES/DRESSINGS) IMPLANT
CANISTER SUCT 1200ML W/VALVE (MISCELLANEOUS) ×1 IMPLANT
COVER BACK TABLE 60X90IN (DRAPES) ×1 IMPLANT
CUFF TOURN SGL QUICK 18X4 (TOURNIQUET CUFF) ×1 IMPLANT
CUFF TRNQT CYL 34X4.125X (TOURNIQUET CUFF) IMPLANT
DRAPE EXTREMITY T 121X128X90 (DISPOSABLE) ×1 IMPLANT
DRAPE IMP U-DRAPE 54X76 (DRAPES) ×1 IMPLANT
DRAPE OEC MINIVIEW 54X84 (DRAPES) ×1 IMPLANT
DRAPE SURG 17X23 STRL (DRAPES) ×2 IMPLANT
DRAPE U-SHAPE 47X51 STRL (DRAPES) ×1 IMPLANT
ELECTRODE REM PT RTRN 9FT ADLT (ELECTROSURGICAL) ×1 IMPLANT
GAUZE PAD ABD 8X10 STRL (GAUZE/BANDAGES/DRESSINGS) IMPLANT
GAUZE SPONGE 4X4 12PLY STRL (GAUZE/BANDAGES/DRESSINGS) ×1 IMPLANT
GAUZE XEROFORM 1X8 LF (GAUZE/BANDAGES/DRESSINGS) ×1 IMPLANT
GLOVE BIOGEL PI IND STRL 7.5 (GLOVE) ×1 IMPLANT
GLOVE BIOGEL PI IND STRL 9 (GLOVE) IMPLANT
GLOVE ECLIPSE 7.0 STRL STRAW (GLOVE) ×1 IMPLANT
GLOVE INDICATOR 7.0 STRL GRN (GLOVE) ×1 IMPLANT
GLOVE SURG SYN 7.5 E (GLOVE) ×1 IMPLANT
GLOVE SURG SYN 7.5 PF PI (GLOVE) ×1 IMPLANT
GOWN STRL REUS W/ TWL LRG LVL3 (GOWN DISPOSABLE) ×1 IMPLANT
GOWN STRL REUS W/ TWL XL LVL3 (GOWN DISPOSABLE) ×1 IMPLANT
GOWN STRL SURGICAL XL XLNG (GOWN DISPOSABLE) ×1 IMPLANT
KWIRE ACE 1.6X6 (WIRE) IMPLANT
NDL HYPO 22X1.5 SAFETY MO (MISCELLANEOUS) IMPLANT
NEEDLE HYPO 22X1.5 SAFETY MO (MISCELLANEOUS) IMPLANT
NS IRRIG 1000ML POUR BTL (IV SOLUTION) ×1 IMPLANT
PACK BASIN DAY SURGERY FS (CUSTOM PROCEDURE TRAY) ×1 IMPLANT
PAD CAST 3X4 CTTN HI CHSV (CAST SUPPLIES) IMPLANT
PAD CAST 4YDX4 CTTN HI CHSV (CAST SUPPLIES) IMPLANT
PEG FULLY THREADED 2.5X22MM (Peg) IMPLANT
PENCIL SMOKE EVACUATOR (MISCELLANEOUS) ×1 IMPLANT
PLATE LOCK 1ST LT MTP (Plate) IMPLANT
SCREW PEG 2.5X12 NONLOCK (Screw) IMPLANT
SCREW PEG 2.5X18 NONLOCK (Screw) IMPLANT
SCREW PEG LOCK 2.5X18 (Peg) IMPLANT
SHEET MEDIUM DRAPE 40X70 STRL (DRAPES) ×2 IMPLANT
SLEEVE SCD COMPRESS KNEE MED (STOCKING) ×1 IMPLANT
SPIKE FLUID TRANSFER (MISCELLANEOUS) IMPLANT
SPONGE T-LAP 18X18 ~~LOC~~+RFID (SPONGE) ×1 IMPLANT
STOCKINETTE 6 STRL (DRAPES) IMPLANT
SUCTION TUBE FRAZIER 10FR DISP (SUCTIONS) ×1 IMPLANT
SUT ETHILON 3 0 PS 1 (SUTURE) IMPLANT
SUT VIC AB 0 CT1 27XBRD ANBCTR (SUTURE) IMPLANT
SUT VIC AB 2-0 CT1 TAPERPNT 27 (SUTURE) ×1 IMPLANT
SYR BULB EAR ULCER 3OZ GRN STR (SYRINGE) ×1 IMPLANT
SYR CONTROL 10ML LL (SYRINGE) IMPLANT
TOWEL GREEN STERILE FF (TOWEL DISPOSABLE) ×1 IMPLANT
TUBE CONNECTING 20X1/4 (TUBING) ×1 IMPLANT
UNDERPAD 30X36 HEAVY ABSORB (UNDERPADS AND DIAPERS) ×1 IMPLANT
YANKAUER SUCT BULB TIP NO VENT (SUCTIONS) IMPLANT

## 2023-11-07 NOTE — Anesthesia Procedure Notes (Signed)
 Anesthesia Regional Block: Popliteal block   Pre-Anesthetic Checklist: , timeout performed,  Correct Patient, Correct Site, Correct Laterality,  Correct Procedure, Correct Position, site marked,  Risks and benefits discussed,  Surgical consent,  Pre-op evaluation,  At surgeon's request and post-op pain management  Laterality: Left  Prep: chloraprep       Needles:  Injection technique: Single-shot  Needle Type: Stimiplex     Needle Length: 9cm  Needle Gauge: 21     Additional Needles:   Narrative:  Start time: 11/07/2023 12:17 PM End time: 11/07/2023 12:22 PM Injection made incrementally with aspirations every 5 mL.  Performed by: Personally

## 2023-11-07 NOTE — Discharge Instructions (Addendum)
 Postoperative instructions:  Weightbearing instructions: as tolerated in Darco shoe.  Do not place pressure on big toe or bare foot.  Dressing instructions: Keep your dressing and/or splint clean and dry at all times.  It will be removed at your first post-operative appointment.  Your stitches and/or staples will be removed at this visit.  Incision instructions:  Do not soak your incision for 3 weeks after surgery.  If the incision gets wet, pat dry and do not scrub the incision.  Pain control:  You have been given a prescription to be taken as directed for post-operative pain control.  In addition, elevate the operative extremity above the heart at all times to prevent swelling and throbbing pain.  Take over-the-counter Colace, 100mg  by mouth twice a day while taking narcotic pain medications to help prevent constipation.  Follow up appointments: 1) 14 days for suture removal and wound check. 2) Dr. Jerri as scheduled.   -------------------------------------------------------------------------------------------------------------  After Surgery Pain Control:  After your surgery, post-surgical discomfort or pain is likely. This discomfort can last several days to a few weeks. At certain times of the day your discomfort may be more intense.  Did you receive a nerve block?  A nerve block can provide pain relief for one hour to two days after your surgery. As long as the nerve block is working, you will experience little or no sensation in the area the surgeon operated on.  As the nerve block wears off, you will begin to experience pain or discomfort. It is very important that you begin taking your prescribed pain medication before the nerve block fully wears off. Treating your pain at the first sign of the block wearing off will ensure your pain is better controlled and more tolerable when full-sensation returns. Do not wait until the pain is intolerable, as the medicine will be less effective.  It is better to treat pain in advance than to try and catch up.  General Anesthesia:  If you did not receive a nerve block during your surgery, you will need to start taking your pain medication shortly after your surgery and should continue to do so as prescribed by your surgeon.  Pain Medication:  Most commonly we prescribe Vicodin and Percocet for post-operative pain. Both of these medications contain a combination of acetaminophen  (Tylenol ) and a narcotic to help control pain.   It takes between 30 and 45 minutes before pain medication starts to work. It is important to take your medication before your pain level gets too intense.   Nausea is a common side effect of many pain medications. You will want to eat something before taking your pain medicine to help prevent nausea.   If you are taking a prescription pain medication that contains acetaminophen , we recommend that you do not take additional over the counter acetaminophen  (Tylenol ).  Other pain relieving options:   Using a cold pack to ice the affected area a few times a day (15 to 20 minutes at a time) can help to relieve pain, reduce swelling and bruising.   Elevation of the affected area can also help to reduce pain and swelling.  Per [facility] clinic policy, our goal is ensure optimal postoperative pain control with a multimodal pain management strategy. For all [facility] patients, our goal is to wean post-operative narcotic medications by 6 weeks post-operatively. If this is not possible due to utilization of pain medication prior to surgery, your [facility] doctor will support your acute post-operative pain control for the  first 6 weeks postoperatively, with a plan to transition you back to your primary pain team following that. Megan Bradford will work to ensure a Therapist, occupational.  Regional Anesthesia Blocks  1. You may not be able to move or feel the blocked extremity after a regional anesthetic block. This may last may last from  3-48 hours after placement, but it will go away. The length of time depends on the medication injected and your individual response to the medication. As the nerves start to wake up, you may experience tingling as the movement and feeling returns to your extremity. If the numbness and inability to move your extremity has not gone away after 48 hours, please call your surgeon.   2. The extremity that is blocked will need to be protected until the numbness is gone and the strength has returned. Because you cannot feel it, you will need to take extra care to avoid injury. Because it may be weak, you may have difficulty moving it or using it. You may not know what position it is in without looking at it while the block is in effect.  3. For blocks in the legs and feet, returning to weight bearing and walking needs to be done carefully. You will need to wait until the numbness is entirely gone and the strength has returned. You should be able to move your leg and foot normally before you try and bear weight or walk. You will need someone to be with you when you first try to ensure you do not fall and possibly risk injury.  4. Bruising and tenderness at the needle site are common side effects and will resolve in a few days.  5. Persistent numbness or new problems with movement should be communicated to the surgeon or the [facility] [phone] [facility] [phone]).  Post Anesthesia Home Care Instructions  Activity: Get plenty of rest for the remainder of the day. A responsible individual must stay with you for 24 hours following the procedure.  For the next 24 hours, DO NOT: -Drive a car -Advertising copywriter -Drink alcoholic beverages -Take any medication unless instructed by your physician -Make any legal decisions or sign important papers.  Meals: Start with liquid foods such as gelatin or soup. Progress to regular foods as tolerated. Avoid greasy, spicy, heavy  foods. If nausea and/or vomiting occur, drink only clear liquids until the nausea and/or vomiting subsides. Call your physician if vomiting continues.  Special Instructions/Symptoms: Your throat may feel dry or sore from the anesthesia or the breathing tube placed in your throat during surgery. If this causes discomfort, gargle with warm salt water. The discomfort should disappear within 24 hours.  If you had a scopolamine patch placed behind your ear for the management of post- operative nausea and/or vomiting:  1. The medication in the patch is effective for 72 hours, after which it should be removed.  Wrap patch in a tissue and discard in the trash. Wash hands thoroughly with soap and water. 2. You may remove the patch earlier than 72 hours if you experience unpleasant side effects which may include dry mouth, dizziness or visual disturbances. 3. Avoid touching the patch. Wash your hands with soap and water after contact with the patch.

## 2023-11-07 NOTE — Progress Notes (Signed)
Assisted Dr. Miller with left, popliteal, ultrasound guided block. Side rails up, monitors on throughout procedure. See vital signs in flow sheet. Tolerated Procedure well. 

## 2023-11-07 NOTE — Anesthesia Postprocedure Evaluation (Signed)
 Anesthesia Post Note  Patient: ODELLA APPELHANS  Procedure(s) Performed: FUSION, JOINT, GREAT TOE (Left: Toe)     Patient location during evaluation: PACU Anesthesia Type: General Level of consciousness: awake and alert, oriented and patient cooperative Pain management: pain level controlled Vital Signs Assessment: post-procedure vital signs reviewed and stable Respiratory status: spontaneous breathing, nonlabored ventilation and respiratory function stable Cardiovascular status: blood pressure returned to baseline and stable Postop Assessment: no apparent nausea or vomiting, adequate PO intake and able to ambulate Anesthetic complications: no  No notable events documented.  Last Vitals:  Vitals:   11/07/23 1545 11/07/23 1549  BP: (!) 139/90   Pulse: 82 81  Resp: 12 15  Temp:    SpO2: 96% 96%    Last Pain:  Vitals:   11/07/23 1515  TempSrc:   PainSc: 0-No pain                 Ella Guillotte,E. Ayerim Berquist

## 2023-11-07 NOTE — H&P (Signed)
 PREOPERATIVE H&P  Chief Complaint: left hallux rigidus  HPI: Megan Bradford is a 73 y.o. female who presents for surgical treatment of left hallux rigidus.  She denies any changes in medical history.  Past Surgical History:  Procedure Laterality Date   ANAL RECTAL MANOMETRY N/A 08/03/2021   Procedure: ANO RECTAL MANOMETRY;  Surgeon: Eda Iha, MD;  Location: [facility];  Service: Gastroenterology;  Laterality: N/A;   CARDIAC CATHETERIZATION     x2   CESAREAN SECTION N/A    Phreesia 11/24/2019   CHOLECYSTECTOMY N/A    Phreesia 11/24/2019   COLONOSCOPY     DILATATION & CURETTAGE/HYSTEROSCOPY WITH MYOSURE N/A 10/28/2021   Procedure: DILATATION & CURETTAGE/HYSTEROSCOPY;  Surgeon: Gloriann Chick, MD;  Location: [facility];  Service: Gynecology;  Laterality: N/A;   DILATION AND CURETTAGE OF UTERUS     EYE SURGERY N/A    Cataracts removed Phreesia 11/24/2019   HIP SURGERY Left 1990   Hip dysplasia cleaned out per patient   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   LAPAROSCOPIC SALPINGO OOPHERECTOMY Bilateral 10/28/2021   Procedure: LAPAROSCOPIC SALPINGO OOPHORECTOMY WITH PELVIC WASHINGS;  Surgeon: Gloriann Chick, MD;  Location: [facility];  Service: Gynecology;  Laterality: Bilateral;   POLYPECTOMY     HPP   TONSILLECTOMY     TRANSFORAMINAL LUMBAR INTERBODY FUSION (TLIF) WITH PEDICLE SCREW FIXATION 1 LEVEL N/A 12/01/2020   Procedure: Transiforaminal Lumbar Interbody Fusion Lumbar four--Lumbar five;  Surgeon: Debby Dorn MATSU, MD;  Location: [facility];  Service: Neurosurgery;  Laterality: N/A;   TRIGGER FINGER RELEASE Right 10/28/2020   Long finger   VULVAR LESION REMOVAL N/A 10/28/2021   Procedure: COMPLETE RESECTION OF VULVAR WARTS;  Surgeon: Gloriann Chick, MD;  Location:  SURGERY CENTER;  Service: Gynecology;  Laterality: N/A;   Social History   Socioeconomic History   Marital status: Widowed    Spouse name: Not on file   Number of  children: Not on file   Years of education: Not on file   Highest education level: Associate degree: occupational, Scientist, product/process development, or vocational program  Occupational History   Not on file  Tobacco Use   Smoking status: Never   Smokeless tobacco: Never  Vaping Use   Vaping status: Never Used  Substance and Sexual Activity   Alcohol use: Never   Drug use: Never   Sexual activity: Not Currently    Birth control/protection: None  Other Topics Concern   Not on file  Social History Narrative   Not on file   Social Drivers of Health   Financial Resource Strain: Low Risk  (02/20/2023)   Overall Financial Resource Strain (CARDIA)    Difficulty of Paying Living Expenses: Not hard at all  Food Insecurity: No Food Insecurity (02/20/2023)   Hunger Vital Sign    Worried About Running Out of Food in the Last Year: Never true    Ran Out of Food in the Last Year: Never true  Transportation Needs: No Transportation Needs (02/20/2023)   PRAPARE - Administrator, Civil Service (Medical): No    Lack of Transportation (Non-Medical): No  Physical Activity: Insufficiently Active (02/20/2023)   Exercise Vital Sign    Days of Exercise per Week: 3 days    Minutes of Exercise per Session: 20 min  Stress: No Stress Concern Present (02/20/2023)   [employer] of Occupational Health - Occupational Stress Questionnaire    Feeling of Stress : Only a little  Social  Connections: Moderately Integrated (02/20/2023)   Social Connection and Isolation Panel    Frequency of Communication with Friends and Family: More than three times a week    Frequency of Social Gatherings with Friends and Family: Twice a week    Attends Religious Services: More than 4 times per year    Active Member of [employer] or Organizations: Yes    Attends Banker Meetings: More than 4 times per year    Marital Status: Widowed   Family History  Problem Relation Age of Onset   Diabetes Mother    Asthma Mother     Hypertension Father    Coronary artery disease Father    Colon cancer Neg Hx    Colon polyps Neg Hx    Esophageal cancer Neg Hx    Rectal cancer Neg Hx    Stomach cancer Neg Hx    Breast cancer Neg Hx    Ovarian cancer Neg Hx    Endometrial cancer Neg Hx    Pancreatic cancer Neg Hx    Prostate cancer Neg Hx    Allergies  Allergen Reactions   Keflex [Cephalexin] Anaphylaxis   Prior to Admission medications   Medication Sig Start Date End Date Taking? Authorizing Provider  amLODipine  (NORVASC ) 5 MG tablet Take 5 mg by mouth daily.   Yes [provider]  aspirin  (ASPIRIN  81) 81 MG chewable tablet Chew 1 tablet (81 mg total) by mouth 2 (two) times daily. Take one tab po bid x 6 weeks post-op to prevent blood clots 10/30/23   Jule Ronal CROME, [name]  aspirin  EC 81 MG tablet Take 81 mg by mouth daily. Swallow whole.   Yes [provider]  carvedilol  (COREG ) 12.5 MG tablet Take 12.5 mg by mouth 2 (two) times daily with a meal.   Yes [provider]  cetirizine (ZYRTEC) 10 MG tablet Take 10 mg by mouth daily.   Yes [provider]  Cholecalciferol  (VITAMIN D3) 10 MCG (400 UNIT) CAPS Take 400 Units by mouth daily.   Yes [provider]  Coenzyme Q10 (CO Q10 PO) Take 1 capsule by mouth daily.   Yes [provider]  diphenhydramine -acetaminophen  (TYLENOL  PM) 25-500 MG TABS tablet Take 1 tablet by mouth at bedtime as needed (As needed).   Yes [provider]  losartan (COZAAR) 25 MG tablet Take 25 mg by mouth daily. 12/19/22  Yes [provider]  magnesium  oxide (MAG-OX) 400 MG tablet Take 400 mg by mouth daily.   Yes [provider]  mycophenolate  (MYFORTIC ) 180 MG EC tablet Take 180 mg by mouth 2 (two) times daily.   Yes [provider]  rosuvastatin  (CRESTOR ) 20 MG tablet Take 1 tablet (20 mg total) by mouth daily. 12/20/22  Yes Delford Maude BROCKS, MD  tacrolimus  (PROGRAF ) 1 MG capsule TAKE 4 CAPSULE BY MOUTH EVERY  MORNING AND 2 CAPSULE BY MOUTH EVERY EVENING 09/20/23  Yes Sagardia, Emil Schanz, MD  diclofenac  Sodium (VOLTAREN ) 1 % GEL Apply 2 g topically 4 (four) times daily. 12/01/19   Wieters, Hallie C, [name]  HYDROcodone -acetaminophen  (NORCO/VICODIN) 5-325 MG tablet Take 1 tablet by mouth 3 (three) times daily as needed for moderate pain (pain score 4-6). 10/30/23   Jule Ronal CROME, [name]  LORazepam  (ATIVAN ) 2 MG tablet Take 2 mg by mouth 2 (two) times daily as needed. 01/10/22   [provider]  ondansetron  (ZOFRAN ) 4 MG tablet Take 1 tablet (4 mg total) by mouth every 8 (eight) hours  as needed for nausea or vomiting. 10/30/23   Jule Ronal CROME, [name]     Positive ROS: All other systems have been reviewed and were otherwise negative with the exception of those mentioned in the HPI and as above.  Physical Exam: General: Alert, no acute distress Cardiovascular: No pedal edema Respiratory: No cyanosis, no use of accessory musculature GI: abdomen soft Skin: No lesions in the area of chief complaint Neurologic: Sensation intact distally Psychiatric: Patient is competent for consent with normal mood and affect Lymphatic: no lymphedema  MUSCULOSKELETAL: exam stable  Assessment: left hallux rigidus  Plan: Plan for Procedure(s): FUSION, JOINT, GREAT TOE  The risks benefits and alternatives were discussed with the patient including but not limited to the risks of nonoperative treatment, versus surgical intervention including infection, bleeding, nerve injury,  blood clots, cardiopulmonary complications, morbidity, mortality, among others, and they were willing to proceed.   Ozell Cummins, MD 11/07/2023 10:17 AM

## 2023-11-07 NOTE — Anesthesia Preprocedure Evaluation (Addendum)
 Anesthesia Evaluation  Patient identified by MRN, date of birth, ID band Patient awake    Reviewed: Allergy & Precautions, NPO status , Patient's Chart, lab work & pertinent test results, reviewed documented beta blocker date and time   History of Anesthesia Complications Negative for: history of anesthetic complications  Airway Mallampati: III  TM Distance: >3 FB Neck ROM: Full    Dental no notable dental hx.    Pulmonary PE   Pulmonary exam normal        Cardiovascular hypertension, Pt. on medications and Pt. on home beta blockers + CAD and +CHF  Normal cardiovascular exam     Neuro/Psych negative neurological ROS  negative psych ROS   GI/Hepatic negative GI ROS, Neg liver ROS,,,  Endo/Other    Class 3 obesity  Renal/GU Renal InsufficiencyRenal disease  negative genitourinary   Musculoskeletal negative musculoskeletal ROS (+) Arthritis , Osteoarthritis,    Abdominal  (+) + obese  Peds  Hematology negative hematology ROS (+)   Anesthesia Other Findings Day of surgery medications reviewed with patient.  Reproductive/Obstetrics negative OB ROS                             Anesthesia Physical Anesthesia Plan  ASA: 3  Anesthesia Plan: General   Post-op Pain Management: Tylenol  PO (pre-op)*, Regional block* and Minimal or no pain anticipated   Induction: Intravenous  PONV Risk Score and Plan: 3 and Treatment may vary due to age or medical condition, Midazolam , Dexamethasone  and Ondansetron   Airway Management Planned: LMA  Additional Equipment: None  Intra-op Plan:   Post-operative Plan: Extubation in OR  Informed Consent: I have reviewed the patients History and Physical, chart, labs and discussed the procedure including the risks, benefits and alternatives for the proposed anesthesia with the patient or authorized representative who has indicated his/her understanding and  acceptance.     Dental advisory given  Plan Discussed with: CRNA  Anesthesia Plan Comments:         Anesthesia Quick Evaluation

## 2023-11-07 NOTE — Op Note (Signed)
 Date of Surgery: 11/07/2023  INDICATIONS: Ms. Megan Bradford is a 73 y.o.-year-old female with a left hallux rigidus that failed conservative treatment.  The patient did consent to the procedure after discussion of the risks and benefits.  PREOPERATIVE DIAGNOSIS: Left hallux rigidus  POSTOPERATIVE DIAGNOSIS: Same.  PROCEDURE: Arthrodesis of left great toe MTP joint  SURGEON: N. Ozell Cummins, M.D.  ASSIST: Ronal Jacobsen [place], [place]; necessary for the timely completion of procedure and due to complexity of procedure.  ANESTHESIA:  general, popliteal block  IV FLUIDS AND URINE: See anesthesia.  ESTIMATED BLOOD LOSS: minimal mL.  IMPLANTS:  Implant Name Type Inv. Item Serial No. Manufacturer Lot No. LRB No. Used Action  PLATE LOCK 1ST LT MTP - [id number] Plate PLATE LOCK 1ST LT MTP  [employer](ORTH,TRAU,BIO,SG)  Left 1 Implanted  SCREW PEG 2.5X18 NONLOCK - [id number] Screw SCREW PEG 2.5X18 NONLOCK  [employer]ON(ORTH,TRAU,BIO,SG)  Left 1 Implanted  SCREW PEG LOCK 2.5X18 - [id number] Peg SCREW PEG LOCK 2.5X18  [employer](ORTH,TRAU,BIO,SG)  Left 2 Implanted  PEG FULLY THREADED 2.5X22MM - [id number] Peg PEG FULLY THREADED 2.5X22MM  [employer](ORTH,TRAU,BIO,SG)  Left 2 Implanted    DRAINS: None  COMPLICATIONS: see description of procedure.  DESCRIPTION OF PROCEDURE: The patient was brought to the operating room.  The patient had been signed prior to the procedure and this was documented. The patient had the anesthesia placed by the anesthesiologist.  A time-out was performed to confirm that this was the correct patient, site, side and location. The patient did receive antibiotics prior to the incision and was re-dosed during the procedure as needed at indicated intervals.  A tourniquet was placed.  The patient had the operative extremity prepped and draped in the standard surgical fashion.    A dorsomedial incision was made over the MTP joint medial to the EHL tendon.  Dissection carried  down through subcutaneous tissue bluntly.  Gross bleeders cauterized.  Cutaneous nerves mobilized and retracted.  The EHL was mobilized laterally.  Capsulotomy was performed sharply with a 15 blade down to the bone and sharp elevation was performed to expose the MTP joint and the proximal phalanx and metatarsal.  The MTP joint was bone on bone and completely denuded of cartilage.  Large circumferential osteophytosis was seen.  The osteophytes were removed.  The toe was plantarflexed.  Soft tissue releases were performed to improve exposure.  A K wire was advanced up the axis of the metatarsal and checked on fluoro.  A 20 mm concave reamer was used to remove sclerotic and arthritic bone down to bleeding bone.  Same was done on the proximal phalanx with a 20 mm convex reamer.  The remaining osteophytes were removed with a rondure and the joint surfaces were approximated at the correct orientation.  The toe was placed in a plantigrade position and a K wire was advanced across the MTP joint to hold this in place.  This was checked under fluoroscopy.  A 2.5 mm locking plate was placed on the dorsal surface.  Screws were placed through the plate to secure this to the metatarsal and the proximal phalanx.  There was good compression across the fusion site.  The K wire was removed.  Final x-rays were taken.  Surgical site was thoroughly irrigated and closed in a layered fashion.  Jacobsen Grave was necessary for opening, closing, retracting, limb positioning and overall facilitation and timely completion of the procedure.  POSTOPERATIVE PLAN: Discharge home.  Weight bearing as tolerated to the heel in  Darco shoe.  Follow up 2 weeks for suture removal.  GEANNIE Ozell Cummins, MD 2:17 PM

## 2023-11-07 NOTE — Transfer of Care (Signed)
 Immediate Anesthesia Transfer of Care Note  Patient: Megan Bradford  Procedure(s) Performed: FUSION, JOINT, GREAT TOE (Left: Toe)  Patient Location: PACU  Anesthesia Type:General  Level of Consciousness: awake, alert , and oriented  Airway & Oxygen Therapy: Patient Spontanous Breathing and Patient connected to face mask oxygen  Post-op Assessment: Report given to RN and Post -op Vital signs reviewed and stable  Post vital signs: Reviewed and stable  Last Vitals:  Vitals Value Taken Time  BP 165/106 11/07/23 14:16  Temp    Pulse 87 11/07/23 14:20  Resp 17 11/07/23 14:20  SpO2 100 % 11/07/23 14:20  Vitals shown include unfiled device data.  Last Pain:  Vitals:   11/07/23 1042  TempSrc: Temporal  PainSc: 0-No pain         Complications: No notable events documented.

## 2023-11-07 NOTE — Anesthesia Procedure Notes (Signed)
 Procedure Name: LMA Insertion Date/Time: 11/07/2023 12:34 PM  Performed by: Buster Catheryn SAUNDERS, CRNAPre-anesthesia Checklist: Patient identified, Emergency Drugs available, Suction available and Patient being monitored Patient Re-evaluated:Patient Re-evaluated prior to induction Oxygen Delivery Method: Circle system utilized Preoxygenation: Pre-oxygenation with 100% oxygen Induction Type: IV induction Ventilation: Mask ventilation without difficulty LMA: LMA inserted LMA Size: 4.0 Number of attempts: 1 Placement Confirmation: positive ETCO2 Tube secured with: Tape Dental Injury: Teeth and Oropharynx as per pre-operative assessment

## 2023-11-08 ENCOUNTER — Encounter (HOSPITAL_BASED_OUTPATIENT_CLINIC_OR_DEPARTMENT_OTHER): Payer: Self-pay | Admitting: Orthopaedic Surgery

## 2023-11-08 ENCOUNTER — Ambulatory Visit: Payer: 59

## 2023-11-08 ENCOUNTER — Ambulatory Visit

## 2023-11-08 VITALS — Ht 61.0 in | Wt 185.0 lb

## 2023-11-08 DIAGNOSIS — Z0000 Encounter for general adult medical examination without abnormal findings: Secondary | ICD-10-CM | POA: Diagnosis not present

## 2023-11-08 NOTE — Progress Notes (Signed)
 Subjective:   Megan Bradford is a 73 y.o. who presents for a Medicare Wellness preventive visit.  As a reminder, Annual Wellness Visits don't include a physical exam, and some assessments may be limited, especially if this visit is performed virtually. We may recommend an in-person follow-up visit with your provider if needed.  Visit Complete: Virtual I connected with  Megan Bradford on 11/08/23 by a audio enabled telemedicine application and verified that I am speaking with the correct person using two identifiers.  Patient Location: Home  Provider Location: Office/Clinic  I discussed the limitations of evaluation and management by telemedicine. The patient expressed understanding and agreed to proceed.  Vital Signs: Because this visit was a virtual/telehealth visit, some criteria may be missing or patient reported. Any vitals not documented were not able to be obtained and vitals that have been documented are patient reported.  VideoDeclined- This patient declined Librarian, academic. Therefore the visit was completed with audio only.  Persons Participating in Visit: Patient.  AWV Questionnaire: No: Patient Medicare AWV questionnaire was not completed prior to this visit.  Cardiac Risk Factors include: advanced age (>71men, >75 women);Other (see comment)     Objective:    Today's Vitals   11/08/23 1353  Weight: 185 lb (83.9 kg)  Height: 5' 1 (1.549 m)  PainSc: 7    Body mass index is 34.96 kg/m.     11/08/2023    2:08 PM 11/07/2023   10:39 AM 09/14/2023   12:58 PM 11/10/2022   11:00 PM 11/10/2022    6:03 PM 11/02/2022   11:44 AM 05/29/2022   10:00 AM  Advanced Directives  Does Patient Have a Medical Advance Directive? No No No Yes Yes Yes No  Type of Advance Directive    Living will Living will;Healthcare Power of [employer] Power of [place];Living will   Does patient want to make changes to medical advance directive?    No -  Patient declined     Copy of Healthcare Power of Attorney in Chart?    No - copy requested  No - copy requested   Would patient like information on creating a medical advance directive?  No - Patient declined     No - Patient declined    Current Medications (verified) Outpatient Encounter Medications as of 11/08/2023  Medication Sig   amLODipine  (NORVASC ) 5 MG tablet Take 5 mg by mouth daily.   aspirin  (ASPIRIN  81) 81 MG chewable tablet Chew 1 tablet (81 mg total) by mouth 2 (two) times daily. Take one tab po bid x 6 weeks post-op to prevent blood clots   carvedilol  (COREG ) 12.5 MG tablet Take 12.5 mg by mouth 2 (two) times daily with a meal.   cetirizine (ZYRTEC) 10 MG tablet Take 10 mg by mouth daily.   Cholecalciferol  (VITAMIN D3) 10 MCG (400 UNIT) CAPS Take 400 Units by mouth daily.   Coenzyme Q10 (CO Q10 PO) Take 1 capsule by mouth daily.   diclofenac  Sodium (VOLTAREN ) 1 % GEL Apply 2 g topically 4 (four) times daily.   diphenhydramine -acetaminophen  (TYLENOL  PM) 25-500 MG TABS tablet Take 1 tablet by mouth at bedtime as needed (As needed).   HYDROcodone -acetaminophen  (NORCO/VICODIN) 5-325 MG tablet Take 1 tablet by mouth 3 (three) times daily as needed for moderate pain (pain score 4-6).   LORazepam  (ATIVAN ) 2 MG tablet Take 2 mg by mouth 2 (two) times daily as needed.   losartan (COZAAR) 25 MG tablet Take 25 mg  by mouth daily.   magnesium  oxide (MAG-OX) 400 MG tablet Take 400 mg by mouth daily.   mycophenolate  (MYFORTIC ) 180 MG EC tablet Take 180 mg by mouth 2 (two) times daily.   ondansetron  (ZOFRAN ) 4 MG tablet Take 1 tablet (4 mg total) by mouth every 8 (eight) hours as needed for nausea or vomiting.   rosuvastatin  (CRESTOR ) 20 MG tablet Take 1 tablet (20 mg total) by mouth daily.   tacrolimus  (PROGRAF ) 1 MG capsule TAKE 4 CAPSULE BY MOUTH EVERY MORNING AND 2 CAPSULE BY MOUTH EVERY EVENING   aspirin  EC 81 MG tablet Take 81 mg by mouth daily. Swallow whole. (Patient not taking:  Reported on 11/08/2023)   No facility-administered encounter medications on file as of 11/08/2023.    Allergies (verified) Keflex [cephalexin]   History: Past Medical History:  Diagnosis Date   Cataract    Phreesia 11/24/2019- removed both   Chronic kidney disease    Phreesia 11/24/2019   Clotting disorder (HCC)    PE 2010 per pt there really was not a PE   Fistula    LUE; was on dialysis for 11 months   Heart murmur    Hypertension    Phreesia 11/24/2019   Kidney transplanted    transplant team [facility] [place])   Pre-diabetes    no meds   Pulmonary embolism (HCC) 2010   never symptomatic   Past Surgical History:  Procedure Laterality Date   ANAL RECTAL MANOMETRY N/A 08/03/2021   Procedure: ANO RECTAL MANOMETRY;  Surgeon: Eda Iha, MD;  Location: [facility];  Service: Gastroenterology;  Laterality: N/A;   ARTHRODESIS METATARSALPHALANGEAL JOINT (MTPJ) Left 11/07/2023   Procedure: FUSION, JOINT, GREAT TOE;  Surgeon: Jerri Kay HERO, MD;  Location: [facility];  Service: Orthopedics;  Laterality: Left;  LEFT GREAT TOE ARTHRODESIS   CARDIAC CATHETERIZATION     x2   CESAREAN SECTION N/A    Phreesia 11/24/2019   CHOLECYSTECTOMY N/A    Phreesia 11/24/2019   COLONOSCOPY     DILATATION & CURETTAGE/HYSTEROSCOPY WITH MYOSURE N/A 10/28/2021   Procedure: DILATATION & CURETTAGE/HYSTEROSCOPY;  Surgeon: Gloriann Chick, MD;  Location: [facility];  Service: Gynecology;  Laterality: N/A;   DILATION AND CURETTAGE OF UTERUS     EYE SURGERY N/A    Cataracts removed Phreesia 11/24/2019   HIP SURGERY Left 1990   Hip dysplasia cleaned out per patient   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   LAPAROSCOPIC SALPINGO OOPHERECTOMY Bilateral 10/28/2021   Procedure: LAPAROSCOPIC SALPINGO OOPHORECTOMY WITH PELVIC WASHINGS;  Surgeon: Gloriann Chick, MD;  Location: [facility];  Service: Gynecology;  Laterality: Bilateral;   POLYPECTOMY     HPP   TONSILLECTOMY      TRANSFORAMINAL LUMBAR INTERBODY FUSION (TLIF) WITH PEDICLE SCREW FIXATION 1 LEVEL N/A 12/01/2020   Procedure: Transiforaminal Lumbar Interbody Fusion Lumbar four--Lumbar five;  Surgeon: Debby Dorn MATSU, MD;  Location: [facility];  Service: Neurosurgery;  Laterality: N/A;   TRIGGER FINGER RELEASE Right 10/28/2020   Long finger   VULVAR LESION REMOVAL N/A 10/28/2021   Procedure: COMPLETE RESECTION OF VULVAR WARTS;  Surgeon: Gloriann Chick, MD;  Location: [facility];  Service: Gynecology;  Laterality: N/A;   Family History  Problem Relation Age of Onset   Diabetes Mother    Asthma Mother    Hypertension Father    Coronary artery disease Father    Colon cancer Neg Hx    Colon polyps Neg Hx  Esophageal cancer Neg Hx    Rectal cancer Neg Hx    Stomach cancer Neg Hx    Breast cancer Neg Hx    Ovarian cancer Neg Hx    Endometrial cancer Neg Hx    Pancreatic cancer Neg Hx    Prostate cancer Neg Hx    Social History   Socioeconomic History   Marital status: Widowed    Spouse name: Not on file   Number of children: 2   Years of education: Not on file   Highest education level: Associate degree: occupational, Scientist, product/process development, or vocational program  Occupational History   Occupation: Retired  Tobacco Use   Smoking status: Never   Smokeless tobacco: Never  Vaping Use   Vaping status: Never Used  Substance and Sexual Activity   Alcohol use: Never   Drug use: Never   Sexual activity: Not Currently    Birth control/protection: None  Other Topics Concern   Not on file  Social History Narrative   Lives alone/2025   Social Drivers of Health   Financial Resource Strain: Low Risk  (11/08/2023)   Overall Financial Resource Strain (CARDIA)    Difficulty of Paying Living Expenses: Not very hard  Food Insecurity: No Food Insecurity (11/08/2023)   Hunger Vital Sign    Worried About Running Out of Food in the Last Year: Never true    Ran Out of Food in the Last Year: Never true   Transportation Needs: No Transportation Needs (11/08/2023)   PRAPARE - Administrator, Civil Service (Medical): No    Lack of Transportation (Non-Medical): No  Physical Activity: Insufficiently Active (11/08/2023)   Exercise Vital Sign    Days of Exercise per Week: 3 days    Minutes of Exercise per Session: 30 min  Stress: Stress Concern Present (11/08/2023)   [employer] of Occupational Health - Occupational Stress Questionnaire    Feeling of Stress: To some extent  Social Connections: Socially Isolated (11/08/2023)   Social Connection and Isolation Panel    Frequency of Communication with Friends and Family: More than three times a week    Frequency of Social Gatherings with Friends and Family: More than three times a week    Attends Religious Services: Never    Database administrator or Organizations: No    Attends Banker Meetings: Never    Marital Status: Widowed    Tobacco Counseling Counseling given: Not Answered    Clinical Intake:  Pre-visit preparation completed: Yes  Pain : 0-10 Pain Score: 7  (just had surgery) Pain Location: Toe (Comment which one) Pain Orientation: Left Pain Relieving Factors: hydrocodone   Pain Relieving Factors: hydrocodone   BMI - recorded: 34.96 Nutritional Status: BMI > 30  Obese Nutritional Risks: None Diabetes: No  Lab Results  Component Value Date   HGBA1C 6.5 (H) 10/14/2021   HGBA1C 6.4 (H) 11/27/2019     How often do you need to have someone help you when you read instructions, pamphlets, or other written materials from your doctor or pharmacy?: 1 - Never  Interpreter Needed?: No  Information entered by :: Parley Pidcock, RMA   Activities of Daily Living     11/08/2023    2:03 PM 11/07/2023   10:43 AM  In your present state of health, do you have any difficulty performing the following activities:  Hearing? 0 0  Vision? 0 0  Difficulty concentrating or making decisions? 0 0  Walking or  climbing stairs? 0  Dressing or bathing? 0   Doing errands, shopping? 0   Comment [place] tranportation or bus   Quarry manager and eating ? N   Using the Toilet? N   In the past six months, have you accidently leaked urine? N   Do you have problems with loss of bowel control? N   Managing your Medications? N   Managing your Finances? N   Housekeeping or managing your Housekeeping? N     Patient Care Team: Purcell Emil Schanz, MD as PCP - General (Internal Medicine) Delford Maude BROCKS, MD as PCP - Cardiology (Cardiology)  I have updated your Care Teams any recent Medical Services you may have received from other providers in the past year.     Assessment:   This is a routine wellness examination for [place].  Hearing/Vision screen Hearing Screening - Comments:: Denies hearing difficulties   Vision Screening - Comments:: Wears eyeglasses/[facility]   Goals Addressed             This Visit's Progress    To lose 50 ponds.   Not on track    Still working on this goal/2025       Depression Screen     11/08/2023    2:11 PM 11/22/2022    9:54 AM 11/02/2022   11:40 AM 06/13/2022   10:32 AM 03/29/2022   10:20 AM 10/20/2021    1:02 PM 09/13/2021    8:09 AM  PHQ 2/9 Scores  PHQ - 2 Score 1 0 0 0 0 1 1  PHQ- 9 Score 2     7     Fall Risk     11/08/2023    2:09 PM 11/22/2022    9:54 AM 11/02/2022   11:43 AM 06/13/2022   10:32 AM 03/29/2022   10:20 AM  Fall Risk   Falls in the past year? 0 0 0 0 0  Number falls in past yr: 0 0 0 0 0  Injury with Fall? 0 0 0 0 0  Risk for fall due to : No Fall Risks No Fall Risks No Fall Risks No Fall Risks No Fall Risks  Follow up Falls evaluation completed Falls evaluation completed Falls prevention discussed Falls evaluation completed Falls evaluation completed      Data saved with a previous flowsheet row definition    MEDICARE RISK AT HOME:  Medicare Risk at Home Any stairs in or around the home?: No If so, are  there any without handrails?: No Home free of loose throw rugs in walkways, pet beds, electrical cords, etc?: Yes Adequate lighting in your home to reduce risk of falls?: Yes Life alert?: No Use of a cane, walker or w/c?: No Grab bars in the bathroom?: Yes Shower chair or bench in shower?: Yes Elevated toilet seat or a handicapped toilet?: Yes  TIMED UP AND GO:  Was the test performed?  No  Cognitive Function: Declined/Normal: No cognitive concerns noted by patient or family. Patient alert, oriented, able to answer questions appropriately and recall recent events. No signs of memory loss or confusion.        11/02/2022   11:44 AM 08/25/2021   10:32 AM  6CIT Screen  What Year? 0 points 0 points  What month? 0 points 0 points  What time? 0 points 0 points  Count back from 20 0 points 0 points  Months in reverse 0 points 0 points  Repeat phrase 0 points 0 points  Total Score 0 points  0 points    Immunizations Immunization History  Administered Date(s) Administered   Fluad Quad(high Dose 65+) 03/01/2020, 03/15/2021, 02/01/2022   Influenza Split 03/11/2008, 03/14/2012, 02/27/2017   Influenza, Quadrivalent, Recombinant, Inj, Pf 02/13/2018   Influenza,inj,Quad PF,6+ Mos 06/01/2015, 04/14/2016   PFIZER(Purple Top)SARS-COV-2 Vaccination 07/09/2019, 07/30/2019   Pneumococcal Conjugate-13 08/17/2017, 03/01/2020   Pneumococcal Polysaccharide-23 05/16/2007, 11/01/2018, 03/27/2019   Tdap 08/24/2005, 07/11/2013   Unspecified SARS-COV-2 Vaccination 02/01/2022   Zoster, Live 03/14/2012    Screening Tests Health Maintenance  Topic Date Due   Hepatitis C Screening  Never done   Zoster Vaccines- Shingrix (1 of 2) 04/20/1971   COVID-19 Vaccine (4 - 2024-25 season) 01/14/2023   DTaP/Tdap/Td (3 - Td or Tdap) 07/12/2023   INFLUENZA VACCINE  12/14/2023   MAMMOGRAM  09/07/2024   Medicare Annual Wellness (AWV)  11/07/2024   Colonoscopy  05/11/2026   Pneumococcal Vaccine: 50+ Years   Completed   DEXA SCAN  Completed   Hepatitis B Vaccines  Aged Out   HPV VACCINES  Aged Out   Meningococcal B Vaccine  Aged Out    Health Maintenance  Health Maintenance Due  Topic Date Due   Hepatitis C Screening  Never done   Zoster Vaccines- Shingrix (1 of 2) 04/20/1971   COVID-19 Vaccine (4 - 2024-25 season) 01/14/2023   DTaP/Tdap/Td (3 - Td or Tdap) 07/12/2023   Health Maintenance Items Addressed: See Nurse Notes at the end of this note  Additional Screening:  Vision Screening: Recommended annual ophthalmology exams for early detection of glaucoma and other disorders of the eye. Would you like a referral to an eye doctor? No    Dental Screening: Recommended annual dental exams for proper oral hygiene  Community Resource Referral / Chronic Care Management: CRR required this visit?  No   CCM required this visit?  No   Plan:    I have personally reviewed and noted the following in the patient's chart:   Medical and social history Use of alcohol, tobacco or illicit drugs  Current medications and supplements including opioid prescriptions. Patient is currently taking opioid prescriptions. Information provided to patient regarding non-opioid alternatives. Patient advised to discuss non-opioid treatment plan with their provider. Functional ability and status Nutritional status Physical activity Advanced directives List of other physicians Hospitalizations, surgeries, and ER visits in previous 12 months Vitals Screenings to include cognitive, depression, and falls Referrals and appointments  In addition, I have reviewed and discussed with patient certain preventive protocols, quality metrics, and best practice recommendations. A written personalized care plan for preventive services as well as general preventive health recommendations were provided to patient.   Mahonri Seiden L Shunta Mclaurin, CMA   11/08/2023   After Visit Summary: (MyChart) Due to this being a telephonic visit,  the after visit summary with patients personalized plan was offered to patient via MyChart   Notes: Patient is due for a Shingrix and a Tdap vaccines.  She stated that she has just had surgery on her Lt big toe.  Patient is currently not happy with her pharmacy, ([facility]).  I informed patient about [facility] and gave her the number to [employer], as they deliver.  Patient stated that she will be switching over to [facility].  She will call the office to schedule her annual with Dr. Delynn sometime next week.

## 2023-11-08 NOTE — Patient Instructions (Signed)
 Ms. Megan Bradford , Thank you for taking time out of your busy schedule to complete your Annual Wellness Visit with me. I enjoyed our conversation and look forward to speaking with you again next year. I, as well as your care team,  appreciate your ongoing commitment to your health goals. Please review the following plan we discussed and let me know if I can assist you in the future. Your Game plan/ To Do List    Follow up Visits: Next Medicare AWV with our clinical staff: 11/10/2024.   Have you seen your provider in the last 6 months (3 months if uncontrolled diabetes)? No Next Office Visit with your provider: Patient stated that she will call to schedule her yearly with Dr. Sagardia soon.  Her last office visit was on 11/22/2022.  Clinician Recommendations:  Aim for 30 minutes of exercise or brisk walking, 6-8 glasses of water, and 5 servings of fruits and vegetables each day. You are due for a Tetanus and Shingles vaccine.  You can get any of these done at your local pharmacy.  Get well soon.       This is a list of the screening recommended for you and due dates:  Health Maintenance  Topic Date Due   Hepatitis C Screening  Never done   Zoster (Shingles) Vaccine (1 of 2) 04/20/1971   COVID-19 Vaccine (4 - 2024-25 season) 01/14/2023   DTaP/Tdap/Td vaccine (3 - Td or Tdap) 07/12/2023   Medicare Annual Wellness Visit  11/02/2023   Flu Shot  12/14/2023   Mammogram  09/07/2024   Colon Cancer Screening  05/11/2026   Pneumococcal Vaccine for age over 59  Completed   DEXA scan (bone density measurement)  Completed   Hepatitis B Vaccine  Aged Out   HPV Vaccine  Aged Out   Meningitis B Vaccine  Aged Out    Advanced directives: (Declined) Advance directive discussed with you today. Even though you declined this today, please call our office should you change your mind, and we can give you the proper paperwork for you to fill out. Advance Care Planning is important because it:  [x]  Makes sure you  receive the medical care that is consistent with your values, goals, and preferences  [x]  It provides guidance to your family and loved ones and reduces their decisional burden about whether or not they are making the right decisions based on your wishes.  Follow the link provided in your after visit summary or read over the paperwork we have mailed to you to help you started getting your Advance Directives in place. If you need assistance in completing these, please reach out to us  so that we can help you!  See attachments for Preventive Care and Fall Prevention Tips.

## 2023-11-12 NOTE — Progress Notes (Signed)
 Office Visit    Patient Name: Megan Bradford Date of Encounter: 11/19/2023  PCP:  Purcell Emil Schanz, MD   [facility]  Cardiologist:  Maude Emmer, MD  New to me  Advanced Practice Provider:  No care team member to display Electrophysiologist:  None      Chief Complaint    Megan Bradford is a 73 y.o. female with a hx of CAD, ESRD s/p renal transplant 2016 with CKD 3B, hyperlipidemia, hypertension, palpitations, SVT Last seen by PA April 2023  to clear for laparoscopic hysterectomy This was done 10/28/21 with no cardiac issues Virtual visit June 2025 to clear for left bunion surgery done 11/07/23 with no cardiac issues   Past Medical History    Past Medical History:  Diagnosis Date   Cataract    Phreesia 11/24/2019- removed both   Chronic kidney disease    Phreesia 11/24/2019   Clotting disorder (HCC)    PE 2010 per pt there really was not a PE   Fistula    LUE; was on dialysis for 11 months   Heart murmur    Hypertension    Phreesia 11/24/2019   Kidney transplanted    transplant team [facility])   Pre-diabetes    no meds   Pulmonary embolism (HCC) 2010   never symptomatic   Past Surgical History:  Procedure Laterality Date   ANAL RECTAL MANOMETRY N/A 08/03/2021   Procedure: ANO RECTAL MANOMETRY;  Surgeon: Eda Iha, MD;  Location: [facility];  Service: Gastroenterology;  Laterality: N/A;   ARTHRODESIS METATARSALPHALANGEAL JOINT (MTPJ) Left 11/07/2023   Procedure: FUSION, JOINT, GREAT TOE;  Surgeon: Jerri Kay CHRISTELLA, MD;  Location:  SURGERY CENTER;  Service: Orthopedics;  Laterality: Left;  LEFT GREAT TOE ARTHRODESIS   CARDIAC CATHETERIZATION     x2   CESAREAN SECTION N/A    Phreesia 11/24/2019   CHOLECYSTECTOMY N/A    Phreesia 11/24/2019   COLONOSCOPY     DILATATION & CURETTAGE/HYSTEROSCOPY WITH MYOSURE N/A 10/28/2021   Procedure: DILATATION & CURETTAGE/HYSTEROSCOPY;  Surgeon: Gloriann Chick, MD;  Location: [place];  Service: Gynecology;  Laterality: N/A;   DILATION AND CURETTAGE OF UTERUS     EYE SURGERY N/A    Cataracts removed Phreesia 11/24/2019   HIP SURGERY Left 1990   Hip dysplasia cleaned out per patient   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   LAPAROSCOPIC SALPINGO OOPHERECTOMY Bilateral 10/28/2021   Procedure: LAPAROSCOPIC SALPINGO OOPHORECTOMY WITH PELVIC WASHINGS;  Surgeon: Gloriann Chick, MD;  Location: [facility];  Service: Gynecology;  Laterality: Bilateral;   POLYPECTOMY     HPP   TONSILLECTOMY     TRANSFORAMINAL LUMBAR INTERBODY FUSION (TLIF) WITH PEDICLE SCREW FIXATION 1 LEVEL N/A 12/01/2020   Procedure: Transiforaminal Lumbar Interbody Fusion Lumbar four--Lumbar five;  Surgeon: Debby Dorn MATSU, MD;  Location: [facility];  Service: Neurosurgery;  Laterality: N/A;   TRIGGER FINGER RELEASE Right 10/28/2020   Long finger   VULVAR LESION REMOVAL N/A 10/28/2021   Procedure: COMPLETE RESECTION OF VULVAR WARTS;  Surgeon: Gloriann Chick, MD;  Location: [facility];  Service: Gynecology;  Laterality: N/A;    Allergies  Allergies  Allergen Reactions   Keflex [Cephalexin] Anaphylaxis    History of Present Illness    Megan Bradford is a 73 y.o. female with a hx of CAD, ESRD s/p renal transplant 2016 with CKD 3B, hyperlipidemia, hypertension, palpitations, SVT last seen 09/28/20 by  Dr. Hobart.  She noted increasing palpitations November 2021 and monitor ordered which revealed several runs of SVT with longest 17 beats and occasional PAC.  She declined beta-blocker at that time.  Subsequent echocardiogram November 2021 with normal LVEF 65 to 70%, mild LVH, no wall motion abnormalities, grade 1 diastolic dysfunction, no significant valve abnormalities.  Due to known coronary calcification on CT scan, Lexiscan  Myoview  performed 06/2020 was low risk study with no evidence of ischemia.  Had uncomplicated lap hysterectomy June 2023.   Had admission  6/28-30 2024 for syncope. After eating at [employer] with some vagal overtones and nausea with change in position No seizure activity or bowel/bladder incontinence V/Q inconclusive and she did not want anticoagulation with some pleuritic pain and elevated D dimer TTE was normal including RV. Telemetry with no arrhythmia Cramping abdominal pain with diverticulosis on CT Rx with Augmentin    Had left bunion surgery with Dr Jerri 11/07/23 no cardiac issues Some SSCP post surgery Resting pain with pain in back as well Activity now limited by foot ECG in office today non acute   EKGs/Labs/Other Studies Reviewed:   The following studies were reviewed today:  Lexiscan  Myoview  07/08/2020: Nuclear stress EF: 67%. There was no ST segment deviation noted during stress. The study is normal. This is a low risk study. The left ventricular ejection fraction is hyperdynamic (>65%).   Normal pharmacologic nuclear stress test with no evidence for prior infarct or ischemia. LVEF 67%.   TTE 04/13/20: IMPRESSIONS   1. Left ventricular ejection fraction, by estimation, is 65 to 70%. The  left ventricle has normal function. The left ventricle has no regional  wall motion abnormalities. There is mild concentric left ventricular  hypertrophy. Left ventricular diastolic  parameters are consistent with Grade I diastolic dysfunction (impaired  relaxation).   2. Right ventricular systolic function is normal. The right ventricular  size is normal. There is normal pulmonary artery systolic pressure. The  estimated right ventricular systolic pressure is 31.3 mmHg.   3. The mitral valve is normal in structure. Mild mitral valve  regurgitation. No evidence of mitral stenosis.   4. The aortic valve is normal in structure. Aortic valve regurgitation is  not visualized. No aortic stenosis is present.   5. The inferior vena cava is normal in size with greater than 50%  respiratory variability, suggesting right atrial pressure of 3  mmHg.    LE ABI 04/15/20: Summary:  Right: Normal examination. No evidence of arterial occlusive disease.  Medial calcifications noted.   Left: Normal examination. No evidence of arterial occlusive disease.  Medial calcifications noted.    Right: Resting right ankle-brachial index indicates noncompressible right  lower extremity arteries. The right toe-brachial index is normal.   Left: Resting left ankle-brachial index indicates noncompressible left  lower extremity arteries. The left toe-brachial index is normal.    CT chest 06/17/20: IMPRESSION: 1. No imaging stigmata of sarcoidosis. 2. No imaging findings to suggest interstitial lung disease. 3. Moderate air trapping indicative of small airways disease. 4. Aortic atherosclerosis, in addition to left main and 2 vessel coronary artery disease. Please note that although the presence of coronary artery calcium  documents the presence of coronary artery disease, the severity of this disease and any potential stenosis cannot be assessed on this non-gated CT examination. Assessment for potential risk factor modification, dietary therapy or pharmacologic therapy may be warranted, if clinically indicated. 5. Indeterminate left adrenal nodule measuring 2.5 x 1.9 cm. Follow-up adrenal protocol CT scan is recommended  for further characterization. This recommendation follows ACR consensus guidelines: Management of Incidental Adrenal Masses: A White Paper of the ACR Incidental Findings Committee. J Am Coll Radiol 2017 (in Press).   PFT12/28/2020 FVC 1.76 [81%], FEV1 1.54 [90%], TLC 2.81 [62%], DLCO 13.05 [69%] Moderate restriction and diffusion impairment   Cardiac Monitor 03/2020: Predominant rhythm was NSR with average HR 76bpm (ranging from 56bpm to 190bpm) Patient had 250 runs of SVT with longest lasting 17 beats at average HR 117bpm. Fastest was 4 beats at rate 190bpm. Occasional PACs (2.5%; 34547 beats); PVCs were rare Rare couplets  (<1%, 2956), rare triplets (<1% 1597) Patient triggered events mainly correlated with SR/sinus tachycardia with PACs. No Afib, VT or significant pauses   TTE 11/11/22:    IMPRESSIONS     1. Left ventricular ejection fraction, by estimation, is 60 to 65%. The  left ventricle has normal function. The left ventricle has no regional  wall motion abnormalities. Left ventricular diastolic parameters are  consistent with Grade I diastolic  dysfunction (impaired relaxation).   2. Right ventricular systolic function is normal. The right ventricular  size is normal. There is normal pulmonary artery systolic pressure. The  estimated right ventricular systolic pressure is 32.4 mmHg.   3. Left atrial size was mildly dilated.   4. The mitral valve is grossly normal. Mild mitral valve regurgitation.  No evidence of mitral stenosis.   5. The aortic valve is tricuspid. Aortic valve regurgitation is not  visualized. No aortic stenosis is present.   6. The inferior vena cava is normal in size with greater than 50%  respiratory variability, suggesting right atrial pressure of 3 mmHg.   Comparison(s): No significant change from prior study.    EKG:   SR rate 82 normal no RV strain 11/10/22  11/19/2023 SR rate 69 nonspecific ST changes pulmonary dx pattern   Recent Labs: 09/14/2023: ALT 9; Hemoglobin 13.7; Platelets 222 11/06/2023: BUN 20; Creatinine, Ser 2.04; Potassium 3.9; Sodium 141  Recent Lipid Panel    Component Value Date/Time   CHOL 143 10/14/2021 0142   CHOL 203 (H) 11/27/2019 1019   TRIG 64 10/14/2021 0142   HDL 56 10/14/2021 0142   HDL 73 11/27/2019 1019   CHOLHDL 2.6 10/14/2021 0142   VLDL 13 10/14/2021 0142   LDLCALC 74 10/14/2021 0142   LDLCALC 116 (H) 11/27/2019 1019    Home Medications   Current Meds  Medication Sig   amLODipine  (NORVASC ) 5 MG tablet Take 5 mg by mouth daily.   aspirin  (ASPIRIN  81) 81 MG chewable tablet Chew 1 tablet (81 mg total) by mouth 2 (two) times daily.  Take one tab po bid x 6 weeks post-op to prevent blood clots   aspirin  EC 81 MG tablet Take 81 mg by mouth daily. Swallow whole.   carvedilol  (COREG ) 12.5 MG tablet Take 12.5 mg by mouth 2 (two) times daily with a meal.   cetirizine (ZYRTEC) 10 MG tablet Take 10 mg by mouth daily.   Cholecalciferol  (VITAMIN D3) 10 MCG (400 UNIT) CAPS Take 400 Units by mouth daily.   Coenzyme Q10 (CO Q10 PO) Take 1 capsule by mouth daily.   diclofenac  Sodium (VOLTAREN ) 1 % GEL Apply 2 g topically 4 (four) times daily.   diphenhydramine -acetaminophen  (TYLENOL  PM) 25-500 MG TABS tablet Take 1 tablet by mouth at bedtime as needed (As needed).   HYDROcodone -acetaminophen  (NORCO/VICODIN) 5-325 MG tablet Take 1 tablet by mouth 3 (three) times daily as needed for moderate pain (pain score  4-6).   LORazepam  (ATIVAN ) 2 MG tablet Take 2 mg by mouth 2 (two) times daily as needed.   losartan (COZAAR) 25 MG tablet Take 25 mg by mouth daily.   magnesium  oxide (MAG-OX) 400 MG tablet Take 400 mg by mouth daily.   mycophenolate  (MYFORTIC ) 180 MG EC tablet Take 180 mg by mouth 2 (two) times daily.   rosuvastatin  (CRESTOR ) 20 MG tablet Take 1 tablet (20 mg total) by mouth daily.   tacrolimus  (PROGRAF ) 1 MG capsule TAKE 4 CAPSULE BY MOUTH EVERY MORNING AND 2 CAPSULE BY MOUTH EVERY EVENING     Review of Systems      All other systems reviewed and are otherwise negative except as noted above.  Physical Exam    VS:  BP (!) 122/90   Pulse 69   Ht 5' 1 (1.549 m)   Wt 187 lb 9.6 oz (85.1 kg)   SpO2 97%   BMI 35.45 kg/m  , BMI Body mass index is 35.45 kg/m.  Wt Readings from Last 3 Encounters:  11/19/23 187 lb 9.6 oz (85.1 kg)  11/08/23 185 lb (83.9 kg)  11/07/23 185 lb 13.6 oz (84.3 kg)     Affect appropriate Healthy:  appears stated age HEENT: normal Neck supple with no adenopathy JVP normal no bruits no thyromegaly Lungs clear with no wheezing and good diaphragmatic motion Heart:  S1/S2 no murmur, no rub, gallop  or click PMI normal Abdomen: benighn, BS positve, no tenderness, no AAA no bruit.  No HSM or HJR Distal pulses intact with no bruits No edema Neuro non-focal Skin warm and dry No muscular weakness Post left hallux rigidus surgery   Assessment & Plan    Syncope:  June 2024 likely vasovagal. PE not entirely r/o but TTE with normal RV/LV V/Q inconclusive and no CTA with renal transplant and baseline Cr 1.8. Patient deferred empiric anticoagulation. .D dimer elevated 2.57 Troponin negative and ECG with no acute changes no arrhythmias on telemetry RLE duplex negative for DVT with likely bakers cyst.   CAD -known coronary calcification by CT scan.  Myoview  06/2020 lower study with no evidence of ischemia. Some atypical SSCP after her foot surgery will update PET/CT  ESRD s/p renal transplant 2016 with CKD IIIb -baseline creatinine 2.04 11/06/23   Follows with nephrology. Careful titration of diuretic and antihypertensive.   HTN - BP well controlled. Continue current antihypertensive regimen.    HLD -LDL goal less than 70.  On crestor  20 mg daily update labs last in epic HDL 60 LDL 80   DM2 - 08/30/22 Aic 6.9 . Continue to follow with PCP. Not presently on medical therapy.   Lipid/Liver A1c PET/CT  Disposition: Follow up  in a year   Signed, Maude Emmer, MD 11/19/2023, 8:50 AM [facility]

## 2023-11-19 ENCOUNTER — Encounter: Payer: Self-pay | Admitting: Cardiovascular Disease

## 2023-11-19 ENCOUNTER — Ambulatory Visit: Attending: Cardiovascular Disease | Admitting: Cardiovascular Disease

## 2023-11-19 VITALS — BP 122/90 | HR 69 | Ht 61.0 in | Wt 187.6 lb

## 2023-11-19 DIAGNOSIS — R072 Precordial pain: Secondary | ICD-10-CM

## 2023-11-19 DIAGNOSIS — R079 Chest pain, unspecified: Secondary | ICD-10-CM | POA: Diagnosis not present

## 2023-11-19 DIAGNOSIS — N1832 Chronic kidney disease, stage 3b: Secondary | ICD-10-CM

## 2023-11-19 DIAGNOSIS — E782 Mixed hyperlipidemia: Secondary | ICD-10-CM

## 2023-11-19 DIAGNOSIS — Z940 Kidney transplant status: Secondary | ICD-10-CM | POA: Diagnosis not present

## 2023-11-19 DIAGNOSIS — R55 Syncope and collapse: Secondary | ICD-10-CM

## 2023-11-19 NOTE — Patient Instructions (Addendum)
 Medication Instructions:  Your physician recommends that you continue on your current medications as directed. Please refer to the Current Medication list given to you today.  *If you need a refill on your cardiac medications before your next appointment, please call your pharmacy*  Lab Work: If you have labs (blood work) drawn today and your tests are completely normal, you will receive your results only by: MyChart Message (if you have MyChart) OR A paper copy in the mail If you have any lab test that is abnormal or we need to change your treatment, we will call you to review the results.  Testing/Procedures: Your physician has requested that you have cardiac PET CT. Cardiac computed tomography (CT) is a painless test that uses an x-ray machine to take clear, detailed pictures of your heart. For further information please visit [email or web address] Please follow instruction sheet as given.    Follow-Up: At [facility], you and your health needs are our priority.  As part of our continuing mission to provide you with exceptional heart care, our providers are all part of one team.  This team includes your primary Cardiologist (physician) and Advanced Practice Providers or APPs (Physician Assistants and Nurse Practitioners) who all work together to provide you with the care you need, when you need it.  Your next appointment:   1 year(s)  Provider:   Maude Emmer, MD    We recommend signing up for the patient portal called MyChart.  Sign up information is provided on this After Visit Summary.  MyChart is used to connect with patients for Virtual Visits (Telemedicine).  Patients are able to view lab/test results, encounter notes, upcoming appointments, etc.  Non-urgent messages can be sent to your provider as well.   To learn more about what you can do with MyChart, go to [email or web address].   Other Instructions     Please report to Radiology at the [facility]  Main Entrance 30 minutes early for your test.  [address] [place], [place] [address]                         OR   Please report to Radiology at [facility] Main Entrance, medical mall, 30 mins prior to your test.  [address]  [place], [place]  How to Prepare for Your Cardiac PET/CT Stress Test:  Nothing to eat or drink, except water, 3 hours prior to arrival time.  NO caffeine/decaffeinated products, or chocolate 12 hours prior to arrival. (Please note decaffeinated beverages (teas/coffees) still contain caffeine).  If you have caffeine within 12 hours prior, the test will need to be rescheduled.  Medication instructions: Do not take nitrates (isosorbide mononitrate, Ranexa) the day before or day of test Do not take tamsulosin the day before or morning of test Hold theophylline containing medications for 12 hours. Hold Dipyridamole 48 hours prior to the test.  You may take your remaining medications with water.  NO perfume, cologne or lotion on chest or abdomen area. FEMALES - Please avoid wearing dresses to this appointment.  Total time is 1 to 2 hours; you may want to bring reading material for the waiting time.   In preparation for your appointment, medication and supplies will be purchased.  Appointment availability is limited, so if you need to cancel or reschedule, please call the Radiology Department Scheduler at [phone] 24 hours in advance to avoid a cancellation fee of $100.00  What to  Expect When you Arrive:  Once you arrive and check in for your appointment, you will be taken to a preparation room within the Radiology Department.  A technologist or Nurse will obtain your medical history, verify that you are correctly prepped for the exam, and explain the procedure.  Afterwards, an IV will be started in your arm and electrodes will be placed on your skin for EKG monitoring during the stress portion of the exam. Then you will be  escorted to the PET/CT scanner.  There, staff will get you positioned on the scanner and obtain a blood pressure and EKG.  During the exam, you will continue to be connected to the EKG and blood pressure machines.  A small, safe amount of a radioactive tracer will be injected in your IV to obtain a series of pictures of your heart along with an injection of a stress agent.    After your Exam:  It is recommended that you eat a meal and drink a caffeinated beverage to counter act any effects of the stress agent.  Drink plenty of fluids for the remainder of the day and urinate frequently for the first couple of hours after the exam.  Your doctor will inform you of your test results within 7-10 business days.  For more information and frequently asked questions, please visit our website: [email or web address]  For questions about your test or how to prepare for your test, please call: Cardiac Imaging Nurse Navigators Office: [phone]

## 2023-11-21 ENCOUNTER — Encounter: Payer: Self-pay | Admitting: Orthopaedic Surgery

## 2023-11-21 ENCOUNTER — Ambulatory Visit (INDEPENDENT_AMBULATORY_CARE_PROVIDER_SITE_OTHER): Admitting: Orthopaedic Surgery

## 2023-11-21 ENCOUNTER — Other Ambulatory Visit (INDEPENDENT_AMBULATORY_CARE_PROVIDER_SITE_OTHER)

## 2023-11-21 DIAGNOSIS — M79672 Pain in left foot: Secondary | ICD-10-CM

## 2023-11-21 DIAGNOSIS — M2022 Hallux rigidus, left foot: Secondary | ICD-10-CM

## 2023-11-21 NOTE — Progress Notes (Signed)
 Post-Op Visit Note   Patient: Megan Bradford           Date of Birth: Nov 28, 1950           MRN: [id number] Visit Date: 11/21/2023 PCP: Purcell Emil Schanz, MD   Assessment & Plan:  Chief Complaint:  Chief Complaint  Patient presents with   Left Foot - Follow-up    Left great toe arthrodesis 11/07/2023   Visit Diagnoses:  1. Hallux rigidus, left foot   2. Left foot pain     Plan: History of Present Illness The patient presents for a follow-up visit after left GT fusion.  The nerve block provided post-surgery lasted nearly three days, effectively managing pain during that time.  They are interested in knowing when they can safely resume activities such as taking a bath.  They are currently using a protective shoe for weight-bearing activities but find it too heavy and uncomfortable for sleeping.  Physical Exam EXTREMITIES: Incision well healed, no signs of infection or drainage, toe well aligned.  Assessment and Plan Postoperative care following great toe fusion Incision healed, no infection or drainage, toe aligned.  - Remove sutures, apply Steri-Strips, remove in one week. - Allow showers, avoid baths for one week. - Avoid soaking foot in pools, hot tubs, jacuzzis, lakes, oceans. - Transition to weight-bearing as tolerated in postop shoe. - Schedule follow-up in four weeks with x-rays.  Follow-Up Instructions: Return in about 4 weeks (around 12/19/2023) for with Megan Bradford.   Orders:  Orders Placed This Encounter  Procedures   XR Toe Great Left   No orders of the defined types were placed in this encounter.   Imaging: XR Toe Great Left Result Date: 11/21/2023 Xrays show dorsal locking plate and associated screws spanning the MTP joint without any complications.   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   Adrenal mass, 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   Diverticulosis 02/02/2022   Hospital discharge follow-up 10/20/2021   Dyslipidemia 10/20/2021   Chronic diastolic CHF (congestive heart failure) (HCC) 10/14/2021   Hyperlipidemia 10/14/2021   Dyssynergic defecation    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/01/2020   Coronary artery disease involving native coronary artery of native heart 08/30/2020   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/30/2020   CKD stage 3b, GFR 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) 12/17/2019   History of kidney transplant 11/27/2019   Essential hypertension 11/27/2019   Prediabetes 11/27/2019   Obesity (BMI 30-39.9) 11/27/2019   Past Medical History:  Diagnosis Date   Cataract    Phreesia 11/24/2019- removed both   Chronic kidney disease    Phreesia 11/24/2019   Clotting disorder (HCC)    PE 2010 per pt there really was not a PE   Fistula    LUE; was on dialysis for 11 months   Heart murmur    Hypertension    Phreesia 11/24/2019   Kidney transplanted    transplant team [facility])   Pre-diabetes    no meds   Pulmonary embolism (HCC) 2010   never symptomatic    Family History  Problem Relation Age of Onset   Diabetes Mother    Asthma Mother    Hypertension Father    Coronary artery disease Father    Colon cancer Neg Hx    Colon polyps Neg Hx  Esophageal cancer Neg Hx    Rectal cancer Neg Hx    Stomach cancer Neg Hx    Breast cancer Neg Hx    Ovarian cancer Neg Hx    Endometrial cancer Neg Hx    Pancreatic cancer Neg Hx    Prostate cancer Neg Hx     Past Surgical History:  Procedure Laterality Date   ANAL RECTAL MANOMETRY N/A 08/03/2021   Procedure: ANO RECTAL MANOMETRY;  Surgeon: Eda Iha, MD;  Location: [facility];  Service: Gastroenterology;  Laterality: N/A;   ARTHRODESIS METATARSALPHALANGEAL JOINT (MTPJ) Left 11/07/2023   Procedure: FUSION, JOINT, GREAT TOE;  Surgeon: Jerri Kay HERO, MD;  Location: [facility];  Service: Orthopedics;  Laterality: Left;  LEFT GREAT TOE ARTHRODESIS   CARDIAC CATHETERIZATION     x2   CESAREAN SECTION N/A    Phreesia 11/24/2019   CHOLECYSTECTOMY N/A    Phreesia 11/24/2019   COLONOSCOPY     DILATATION & CURETTAGE/HYSTEROSCOPY WITH MYOSURE N/A 10/28/2021   Procedure: DILATATION & CURETTAGE/HYSTEROSCOPY;  Surgeon: Gloriann Chick, MD;  Location: [facility];  Service: Gynecology;  Laterality: N/A;   DILATION AND CURETTAGE OF UTERUS     EYE SURGERY N/A    Cataracts removed Phreesia 11/24/2019   HIP SURGERY Left 1990   Hip dysplasia cleaned out per patient   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   LAPAROSCOPIC SALPINGO OOPHERECTOMY Bilateral 10/28/2021   Procedure: LAPAROSCOPIC SALPINGO OOPHORECTOMY WITH PELVIC WASHINGS;  Surgeon: Gloriann Chick, MD;  Location: [facility];  Service: Gynecology;  Laterality: Bilateral;   POLYPECTOMY     HPP   TONSILLECTOMY     TRANSFORAMINAL LUMBAR INTERBODY FUSION (TLIF) WITH PEDICLE SCREW FIXATION 1 LEVEL N/A 12/01/2020   Procedure: Transiforaminal Lumbar Interbody Fusion Lumbar four--Lumbar five;  Surgeon: Debby Dorn MATSU, MD;  Location: [facility];  Service: Neurosurgery;  Laterality: N/A;   TRIGGER FINGER RELEASE Right 10/28/2020   Long finger   VULVAR LESION REMOVAL N/A 10/28/2021   Procedure: COMPLETE RESECTION OF VULVAR WARTS;  Surgeon: Gloriann Chick, MD;  Location: [facility];  Service: Gynecology;  Laterality: N/A;   Social History   Occupational History   Occupation: Retired  Tobacco Use   Smoking status: Never   Smokeless tobacco: Never  Vaping Use   Vaping status: Never Used  Substance and Sexual Activity   Alcohol use: Never   Drug use: Never   Sexual activity: Not Currently    Birth control/protection: None

## 2023-11-27 ENCOUNTER — Ambulatory Visit

## 2023-11-27 ENCOUNTER — Encounter: Admitting: Emergency Medicine

## 2023-12-03 ENCOUNTER — Ambulatory Visit (INDEPENDENT_AMBULATORY_CARE_PROVIDER_SITE_OTHER): Admitting: Emergency Medicine

## 2023-12-03 ENCOUNTER — Encounter: Payer: Self-pay | Admitting: Emergency Medicine

## 2023-12-03 VITALS — BP 140/80 | HR 66 | Temp 98.0°F | Ht 61.0 in | Wt 187.0 lb

## 2023-12-03 DIAGNOSIS — I5032 Chronic diastolic (congestive) heart failure: Secondary | ICD-10-CM

## 2023-12-03 DIAGNOSIS — N1832 Chronic kidney disease, stage 3b: Secondary | ICD-10-CM

## 2023-12-03 DIAGNOSIS — I10 Essential (primary) hypertension: Secondary | ICD-10-CM

## 2023-12-03 DIAGNOSIS — R7303 Prediabetes: Secondary | ICD-10-CM | POA: Diagnosis not present

## 2023-12-03 DIAGNOSIS — Z940 Kidney transplant status: Secondary | ICD-10-CM

## 2023-12-03 DIAGNOSIS — Z0001 Encounter for general adult medical examination with abnormal findings: Secondary | ICD-10-CM

## 2023-12-03 DIAGNOSIS — I2510 Atherosclerotic heart disease of native coronary artery without angina pectoris: Secondary | ICD-10-CM | POA: Diagnosis not present

## 2023-12-03 DIAGNOSIS — E785 Hyperlipidemia, unspecified: Secondary | ICD-10-CM | POA: Diagnosis not present

## 2023-12-03 DIAGNOSIS — Z0000 Encounter for general adult medical examination without abnormal findings: Secondary | ICD-10-CM | POA: Diagnosis not present

## 2023-12-03 NOTE — Assessment & Plan Note (Signed)
Advised to stay well-hydrated and avoid NSAIDs Continue lisinopril 5 mg daily

## 2023-12-03 NOTE — Progress Notes (Signed)
 Megan Bradford Camera 73 y.o.   Chief Complaint  Patient presents with   Annual Exam    HISTORY OF PRESENT ILLNESS: This is a 73 y.o. female here for annual exam and follow-up on multiple chronic medical conditions. Overall doing well.  Has no complaints or medical concerns today.  HPI   Prior to Admission medications   Medication Sig Start Date End Date Taking? Authorizing Provider  amLODipine  (NORVASC ) 5 MG tablet Take 5 mg by mouth daily.   Yes [provider]  aspirin  (ASPIRIN  81) 81 MG chewable tablet Chew 1 tablet (81 mg total) by mouth 2 (two) times daily. Take one tab po bid x 6 weeks post-op to prevent blood clots 10/30/23  Yes Megan Bradford  aspirin  EC 81 MG tablet Take 81 mg by mouth daily. Swallow whole.   Yes [provider]  carvedilol  (COREG ) 12.5 MG tablet Take 12.5 mg by mouth 2 (two) times daily with a meal.   Yes [provider]  cetirizine (ZYRTEC) 10 MG tablet Take 10 mg by mouth daily.   Yes [provider]  Cholecalciferol  (VITAMIN D3) 10 MCG (400 UNIT) CAPS Take 400 Units by mouth daily.   Yes [provider]  Coenzyme Q10 (CO Q10 PO) Take 1 capsule by mouth daily.   Yes [provider]  diclofenac  Sodium (VOLTAREN ) 1 % GEL Apply 2 g topically 4 (four) times daily. 12/01/19  Yes Wieters, Hallie C, PA-Bradford  diphenhydramine -acetaminophen  (TYLENOL  PM) 25-500 MG TABS tablet Take 1 tablet by mouth at bedtime as needed (As needed).   Yes [provider]  HYDROcodone -acetaminophen  (NORCO/VICODIN) 5-325 MG tablet Take 1 tablet by mouth 3 (three) times daily as needed for moderate pain (pain score 4-6). 10/30/23  Yes Megan Ronal LITTIE, PA-Bradford  LORazepam  (ATIVAN ) 2 MG tablet Take 2 mg by mouth 2 (two) times daily as needed. 01/10/22  Yes [provider]  losartan (COZAAR) 25 MG tablet Take 25 mg by mouth daily. 12/19/22  Yes [provider]  magnesium  oxide (MAG-OX) 400 MG tablet Take 400 mg by mouth  daily.   Yes [provider]  mycophenolate  (MYFORTIC ) 180 MG EC tablet Take 180 mg by mouth 2 (two) times daily.   Yes [provider]  rosuvastatin  (CRESTOR ) 20 MG tablet Take 1 tablet (20 mg total) by mouth daily. 12/20/22  Yes Megan Maude BROCKS, MD  tacrolimus  (PROGRAF ) 1 MG capsule TAKE 4 CAPSULE BY MOUTH EVERY MORNING AND 2 CAPSULE BY MOUTH EVERY EVENING 09/20/23  Yes Megan Nies, Emil Schanz, MD  ondansetron  (ZOFRAN ) 4 MG tablet Take 1 tablet (4 mg total) by mouth every 8 (eight) hours as needed for nausea or vomiting. 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   Adrenal mass, 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   Diverticulosis 02/02/2022   Dyslipidemia 10/20/2021   Chronic diastolic CHF (congestive heart failure) (HCC) 10/14/2021   Hyperlipidemia 10/14/2021   Dyssynergic defecation    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/01/2020   Coronary artery disease involving native coronary artery of native heart 08/30/2020   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/30/2020   CKD stage 3b, GFR 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) 12/17/2019   History of kidney transplant 11/27/2019   Essential hypertension 11/27/2019  Prediabetes 11/27/2019   Obesity (BMI 30-39.9) 11/27/2019    Past Medical History:  Diagnosis Date   Cataract    Phreesia 11/24/2019- removed both   Chronic kidney disease    [employer]reesia 11/24/2019   Clotting disorder (HCC)    PE 2010 per pt there really was not a PE   Fistula    LUE; was on dialysis for 11 months   Heart murmur    Hypertension    [employer]reesia 11/24/2019   Kidney transplanted    transplant team [facility] [place])   Pre-diabetes    no meds   Pulmonary embolism (HCC) 2010   never symptomatic    Past Surgical History:   Procedure Laterality Date   ANAL RECTAL MANOMETRY N/A 08/03/2021   Procedure: ANO RECTAL MANOMETRY;  Surgeon: Eda Iha, MD;  Location: [facility];  Service: Gastroenterology;  Laterality: N/A;   ARTHRODESIS METATARSALPHALANGEAL JOINT (MTPJ) Left 11/07/2023   Procedure: FUSION, JOINT, GREAT TOE;  Surgeon: Jerri Kay HERO, MD;  Location: [facility];  Service: Orthopedics;  Laterality: Left;  LEFT GREAT TOE ARTHRODESIS   CARDIAC CATHETERIZATION     x2   CESAREAN SECTION N/A    Phreesia 11/24/2019   CHOLECYSTECTOMY N/A    Phreesia 11/24/2019   COLONOSCOPY     DILATATION & CURETTAGE/HYSTEROSCOPY WITH MYOSURE N/A 10/28/2021   Procedure: DILATATION & CURETTAGE/HYSTEROSCOPY;  Surgeon: Gloriann Chick, MD;  Location: [facility];  Service: Gynecology;  Laterality: N/A;   DILATION AND CURETTAGE OF UTERUS     EYE SURGERY N/A    Cataracts removed Phreesia 11/24/2019   HIP SURGERY Left 1990   Hip dysplasia cleaned out per patient   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   LAPAROSCOPIC SALPINGO OOPHERECTOMY Bilateral 10/28/2021   Procedure: LAPAROSCOPIC SALPINGO OOPHORECTOMY WITH PELVIC WASHINGS;  Surgeon: Gloriann Chick, MD;  Location: [facility];  Service: Gynecology;  Laterality: Bilateral;   POLYPECTOMY     HPP   TONSILLECTOMY     TRANSFORAMINAL LUMBAR INTERBODY FUSION (TLIF) WITH PEDICLE SCREW FIXATION 1 LEVEL N/A 12/01/2020   Procedure: Transiforaminal Lumbar Interbody Fusion Lumbar four--Lumbar five;  Surgeon: Debby Dorn MATSU, MD;  Location: [facility];  Service: Neurosurgery;  Laterality: N/A;   TRIGGER FINGER RELEASE Right 10/28/2020   Long finger   VULVAR LESION REMOVAL N/A 10/28/2021   Procedure: COMPLETE RESECTION OF VULVAR WARTS;  Surgeon: Gloriann Chick, MD;  Location: [facility];  Service: Gynecology;  Laterality: N/A;    Social History   Socioeconomic History   Marital status: Widowed    Spouse name: Not on file   Number of  children: 2   Years of education: Not on file   Highest education level: Associate degree: occupational, Scientist, product/process development, or vocational program  Occupational History   Occupation: Retired  Tobacco Use   Smoking status: Never   Smokeless tobacco: Never  Vaping Use   Vaping status: Never Used  Substance and Sexual Activity   Alcohol use: Never   Drug use: Never   Sexual activity: Not Currently    Birth control/protection: None  Other Topics Concern   Not on file  Social History Narrative   Lives alone/2025   Social Drivers of Health   Financial Resource Strain: Medium Risk (11/29/2023)   Overall Financial Resource Strain (CARDIA)    Difficulty of Paying Living Expenses: Somewhat hard  Food Insecurity: No Food Insecurity (11/29/2023)   Hunger Vital Sign    Worried About Running Out of  Food in the Last Year: Never true    Ran Out of Food in the Last Year: Never true  Transportation Needs: No Transportation Needs (11/29/2023)   PRAPARE - Administrator, Civil Service (Medical): No    Lack of Transportation (Non-Medical): No  Physical Activity: Insufficiently Active (11/29/2023)   Exercise Vital Sign    Days of Exercise per Week: 3 days    Minutes of Exercise per Session: 20 min  Stress: Stress Concern Present (11/29/2023)   [employer] of Occupational Health - Occupational Stress Questionnaire    Feeling of Stress: To some extent  Social Connections: Unknown (11/29/2023)   Social Connection and Isolation Panel    Frequency of Communication with Friends and Family: More than three times a week    Frequency of Social Gatherings with Friends and Family: Once a week    Attends Religious Services: Patient declined    Database administrator or Organizations: Patient declined    Attends Banker Meetings: Not on file    Marital Status: Widowed  Recent Concern: Social Connections - Socially Isolated (11/08/2023)   Social Connection and Isolation Panel    Frequency  of Communication with Friends and Family: More than three times a week    Frequency of Social Gatherings with Friends and Family: More than three times a week    Attends Religious Services: Never    Database administrator or Organizations: No    Attends Banker Meetings: Never    Marital Status: Widowed  Intimate Partner Violence: Patient Unable To Answer (11/08/2023)   Humiliation, Afraid, Rape, and Kick questionnaire    Fear of Current or Ex-Partner: Patient unable to answer    Emotionally Abused: Patient unable to answer    Physically Abused: Patient unable to answer    Sexually Abused: Patient unable to answer    Family History  Problem Relation Age of Onset   Diabetes Mother    Asthma Mother    Hypertension Father    Coronary artery disease Father    Colon cancer Neg Hx    Colon polyps Neg Hx    Esophageal cancer Neg Hx    Rectal cancer Neg Hx    Stomach cancer Neg Hx    Breast cancer Neg Hx    Ovarian cancer Neg Hx    Endometrial cancer Neg Hx    Pancreatic cancer Neg Hx    Prostate cancer Neg Hx      Review of Systems  Constitutional: Negative.  Negative for chills and fever.  HENT: Negative.  Negative for congestion and sore throat.   Respiratory: Negative.  Negative for shortness of breath.   Cardiovascular: Negative.  Negative for chest pain and palpitations.  Gastrointestinal:  Negative for abdominal pain, diarrhea, nausea and vomiting.  Genitourinary: Negative.  Negative for dysuria and hematuria.  Skin: Negative.  Negative for rash.  Neurological: Negative.  Negative for dizziness and headaches.  All other systems reviewed and are negative.   Vitals:   12/03/23 0932  BP: (!) 140/80  Pulse: 66  Temp: 98 F (36.7 Bradford)  SpO2: 98%    Physical Exam Vitals reviewed.  Constitutional:      Appearance: Normal appearance.  HENT:     Head: Normocephalic.     Mouth/Throat:     Mouth: Mucous membranes are moist.     Pharynx: Oropharynx is clear.   Eyes:     Extraocular Movements: Extraocular movements intact.  Conjunctiva/sclera: Conjunctivae normal.     Pupils: Pupils are equal, round, and reactive to light.  Cardiovascular:     Rate and Rhythm: Normal rate and regular rhythm.     Pulses: Normal pulses.     Heart sounds: Normal heart sounds.  Pulmonary:     Effort: Pulmonary effort is normal.     Breath sounds: Normal breath sounds.  Musculoskeletal:     Cervical back: No tenderness.  Lymphadenopathy:     Cervical: No cervical adenopathy.  Skin:    General: Skin is warm and dry.  Neurological:     General: No focal deficit present.     Mental Status: She is alert and oriented to person, place, and time.  Psychiatric:        Mood and Affect: Mood normal.        Behavior: Behavior normal.      ASSESSMENT & PLAN: Problem List Items Addressed This Visit       Cardiovascular and Mediastinum   Essential hypertension   BP Readings from Last 3 Encounters:  12/03/23 (!) 140/80  11/19/23 (!) 122/90  11/07/23 137/88  Well-controlled hypertension with normal blood pressure readings at home Continue amlodipine  5 mg daily, lisinopril  5 mg daily         Coronary artery disease involving native coronary artery of native heart   Stable without recent anginal episodes Continues daily baby aspirin  and carvedilol  12.5 mg twice a day      Chronic diastolic CHF (congestive heart failure) (HCC)   Stable. Recent echocardiogram report reviewed. No significant abnormalities.         Genitourinary   CKD stage 3b, GFR 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)   Advised to stay well-hydrated and avoid NSAIDs Continue lisinopril  5 mg daily        Other   History of kidney transplant   Continues to follow-up with nephrologist Continues Myfortic  180 mg twice a day and tacrolimus  4 mg twice a day      Prediabetes   Stable.  Diet and nutrition discussed.        Dyslipidemia   Chronic stable condition Continues rosuvastatin  20 mg daily       Other Visit Diagnoses       Encounter for general adult medical examination with abnormal findings    -  Primary        Modifiable risk factors discussed with patient. Anticipatory guidance according to age provided. The following topics were also discussed: Social Determinants of Health Smoking.  Non-smoker Diet and nutrition Benefits of exercise Cancer screening and review of most recent mammogram and colonoscopy reports Vaccinations review and recommendations Cardiovascular risk assessment Mental health including depression and anxiety Fall and accident prevention  Patient Instructions  Health Maintenance After Age 62 After age 81, you are at a higher risk for certain long-term diseases and infections as well as injuries from falls. Falls are a major cause of broken bones and head injuries in people who are older than age 48. Getting regular preventive care can help to keep you healthy and well. Preventive care includes getting regular testing and making lifestyle changes as recommended by your health care provider. Talk with your health care provider about: Which screenings and tests you should have. A screening is a test that checks for a disease when you have no symptoms. A diet and exercise plan that is right for you. What should I know about screenings and tests to prevent falls? Screening and testing are the best  ways to find a health problem early. Early diagnosis and treatment give you the best chance of managing medical conditions that are common after age 53. Certain conditions and lifestyle choices may make you more likely to have a fall. Your health care provider may recommend: Regular vision checks. Poor vision and conditions such as cataracts can make you more likely to have a fall. If you wear glasses, make sure to get your prescription updated if your vision changes. Medicine review. Work with your health care provider to regularly review all of the medicines  you are taking, including over-the-counter medicines. Ask your health care provider about any side effects that may make you more likely to have a fall. Tell your health care provider if any medicines that you take make you feel dizzy or sleepy. Strength and balance checks. Your health care provider may recommend certain tests to check your strength and balance while standing, walking, or changing positions. Foot health exam. Foot pain and numbness, as well as not wearing proper footwear, can make you more likely to have a fall. Screenings, including: Osteoporosis screening. Osteoporosis is a condition that causes the bones to get weaker and break more easily. Blood pressure screening. Blood pressure changes and medicines to control blood pressure can make you feel dizzy. Depression screening. You may be more likely to have a fall if you have a fear of falling, feel depressed, or feel unable to do activities that you used to do. Alcohol use screening. Using too much alcohol can affect your balance and may make you more likely to have a fall. Follow these instructions at home: Lifestyle Do not drink alcohol if: Your health care provider tells you not to drink. If you drink alcohol: Limit how much you have to: 0-1 drink a day for women. 0-2 drinks a day for men. Know how much alcohol is in your drink. In the [place] one drink equals one 12 oz bottle of beer (355 mL), one 5 oz glass of wine (148 mL), or one 1 oz glass of hard liquor (44 mL). Do not use any products that contain nicotine or tobacco. These products include cigarettes, chewing tobacco, and vaping devices, such as e-cigarettes. If you need help quitting, ask your health care provider. Activity  Follow a regular exercise program to stay fit. This will help you maintain your balance. Ask your health care provider what types of exercise are appropriate for you. If you need a cane or walker, use it as recommended by your health care  provider. Wear supportive shoes that have nonskid soles. Safety  Remove any tripping hazards, such as rugs, cords, and clutter. Install safety equipment such as grab bars in bathrooms and safety rails on stairs. Keep rooms and walkways well-lit. General instructions Talk with your health care provider about your risks for falling. Tell your health care provider if: You fall. Be sure to tell your health care provider about all falls, even ones that seem minor. You feel dizzy, tiredness (fatigue), or off-balance. Take over-the-counter and prescription medicines only as told by your health care provider. These include supplements. Eat a healthy diet and maintain a healthy weight. A healthy diet includes low-fat dairy products, low-fat (lean) meats, and fiber from whole grains, beans, and lots of fruits and vegetables. Stay current with your vaccines. Schedule regular health, dental, and eye exams. Summary Having a healthy lifestyle and getting preventive care can help to protect your health and wellness after age 18. Screening and testing are the  best way to find a health problem early and help you avoid having a fall. Early diagnosis and treatment give you the best chance for managing medical conditions that are more common for people who are older than age 11. Falls are a major cause of broken bones and head injuries in people who are older than age 40. Take precautions to prevent a fall at home. Work with your health care provider to learn what changes you can make to improve your health and wellness and to prevent falls. This information is not intended to replace advice given to you by your health care provider. Make sure you discuss any questions you have with your health care provider. Document Revised: 09/20/2020 Document Reviewed: 09/20/2020 Elsevier Patient Education  2024 Elsevier Inc.    Emil Schaumann, MD [facility]

## 2023-12-03 NOTE — Assessment & Plan Note (Signed)
Stable.  Diet and nutrition discussed. 

## 2023-12-03 NOTE — Assessment & Plan Note (Signed)
Continues to follow-up with nephrologist Continues Myfortic 180 mg twice a day and tacrolimus 4 mg twice a day

## 2023-12-03 NOTE — Assessment & Plan Note (Signed)
Stable without recent anginal episodes Continues daily baby aspirin and carvedilol 12.5 mg twice a day

## 2023-12-03 NOTE — Assessment & Plan Note (Signed)
 BP Readings from Last 3 Encounters:  12/03/23 (!) 140/80  11/19/23 (!) 122/90  11/07/23 137/88  Well-controlled hypertension with normal blood pressure readings at home Continue amlodipine  5 mg daily, lisinopril  5 mg daily

## 2023-12-03 NOTE — Patient Instructions (Signed)
 Health Maintenance After Age 72 After age 4, you are at a higher risk for certain long-term diseases and infections as well as injuries from falls. Falls are a major cause of broken bones and head injuries in people who are older than age 47. Getting regular preventive care can help to keep you healthy and well. Preventive care includes getting regular testing and making lifestyle changes as recommended by your health care provider. Talk with your health care provider about: Which screenings and tests you should have. A screening is a test that checks for a disease when you have no symptoms. A diet and exercise plan that is right for you. What should I know about screenings and tests to prevent falls? Screening and testing are the best ways to find a health problem early. Early diagnosis and treatment give you the best chance of managing medical conditions that are common after age 37. Certain conditions and lifestyle choices may make you more likely to have a fall. Your health care provider may recommend: Regular vision checks. Poor vision and conditions such as cataracts can make you more likely to have a fall. If you wear glasses, make sure to get your prescription updated if your vision changes. Medicine review. Work with your health care provider to regularly review all of the medicines you are taking, including over-the-counter medicines. Ask your health care provider about any side effects that may make you more likely to have a fall. Tell your health care provider if any medicines that you take make you feel dizzy or sleepy. Strength and balance checks. Your health care provider may recommend certain tests to check your strength and balance while standing, walking, or changing positions. Foot health exam. Foot pain and numbness, as well as not wearing proper footwear, can make you more likely to have a fall. Screenings, including: Osteoporosis screening. Osteoporosis is a condition that causes  the bones to get weaker and break more easily. Blood pressure screening. Blood pressure changes and medicines to control blood pressure can make you feel dizzy. Depression screening. You may be more likely to have a fall if you have a fear of falling, feel depressed, or feel unable to do activities that you used to do. Alcohol use screening. Using too much alcohol can affect your balance and may make you more likely to have a fall. Follow these instructions at home: Lifestyle Do not drink alcohol if: Your health care provider tells you not to drink. If you drink alcohol: Limit how much you have to: 0-1 drink a day for women. 0-2 drinks a day for men. Know how much alcohol is in your drink. In the [place] one drink equals one 12 oz bottle of beer (355 mL), one 5 oz glass of wine (148 mL), or one 1 oz glass of hard liquor (44 mL). Do not use any products that contain nicotine or tobacco. These products include cigarettes, chewing tobacco, and vaping devices, such as e-cigarettes. If you need help quitting, ask your health care provider. Activity  Follow a regular exercise program to stay fit. This will help you maintain your balance. Ask your health care provider what types of exercise are appropriate for you. If you need a cane or walker, use it as recommended by your health care provider. Wear supportive shoes that have nonskid soles. Safety  Remove any tripping hazards, such as rugs, cords, and clutter. Install safety equipment such as grab bars in bathrooms and safety rails on stairs. Keep rooms and walkways  well-lit. General instructions Talk with your health care provider about your risks for falling. Tell your health care provider if: You fall. Be sure to tell your health care provider about all falls, even ones that seem minor. You feel dizzy, tiredness (fatigue), or off-balance. Take over-the-counter and prescription medicines only as told by your health care provider. These include  supplements. Eat a healthy diet and maintain a healthy weight. A healthy diet includes low-fat dairy products, low-fat (lean) meats, and fiber from whole grains, beans, and lots of fruits and vegetables. Stay current with your vaccines. Schedule regular health, dental, and eye exams. Summary Having a healthy lifestyle and getting preventive care can help to protect your health and wellness after age 11. Screening and testing are the best way to find a health problem early and help you avoid having a fall. Early diagnosis and treatment give you the best chance for managing medical conditions that are more common for people who are older than age 28. Falls are a major cause of broken bones and head injuries in people who are older than age 48. Take precautions to prevent a fall at home. Work with your health care provider to learn what changes you can make to improve your health and wellness and to prevent falls. This information is not intended to replace advice given to you by your health care provider. Make sure you discuss any questions you have with your health care provider. Document Revised: 09/20/2020 Document Reviewed: 09/20/2020 Elsevier Patient Education  2024 [employer].

## 2023-12-03 NOTE — Assessment & Plan Note (Signed)
Chronic stable condition Continues rosuvastatin 20 mg daily

## 2023-12-03 NOTE — Assessment & Plan Note (Signed)
Stable.  Recent echocardiogram report reviewed.  No significant abnormalities.

## 2023-12-13 ENCOUNTER — Encounter: Payer: Self-pay | Admitting: "Endocrinology

## 2023-12-13 ENCOUNTER — Ambulatory Visit (INDEPENDENT_AMBULATORY_CARE_PROVIDER_SITE_OTHER): Admitting: "Endocrinology

## 2023-12-13 VITALS — BP 122/80 | HR 65 | Ht 61.0 in | Wt 189.0 lb

## 2023-12-13 DIAGNOSIS — E278 Other specified disorders of adrenal gland: Secondary | ICD-10-CM

## 2023-12-13 NOTE — Progress Notes (Signed)
 Outpatient Endocrinology Note Obadiah Birmingham, MD    Megan Bradford 08/07/1950 [id number]  Referring Provider: Purcell Emil Schanz, * Primary Care Provider: Purcell Emil Schanz, MD Reason for consultation: Subjective   Assessment & Plan  Diagnoses and all orders for this visit:  Adrenal mass, left (HCC) -     DHEA-sulfate -     ACTH -     Cortisol -     Metanephrines, plasma -     Aldosterone + renin activity w/ ratio -     Basic metabolic panel with GFR   Here for evaluation and management of Left adrenal adenoma  01/13/2022 Adrenals/Urinary Tract: There is a 2.5 cm indeterminate left adrenal nodule. This can be better characterized with MRI on a non-emergent/outpatient basis. The native kidneys are atrophic. There is a right lower quadrant renal transplant. 10/2022: CT ABDOMEN AND PELVIS WITHOUT CONTRAST Adrenals: There is a able 2.5 x 2.7 cm left adrenal gland nodule with a density of 36 Hounsfield units. No right adrenal gland nodule. 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 Ordered baseline labs   Return in about 1 month (around 01/15/2024) for visit, labs today.   I have reviewed current medications, nurse's notes, allergies, vital signs, past medical and surgical history, family medical history, and social history for this encounter. Counseled patient on symptoms, examination findings, lab findings, imaging results, treatment decisions and monitoring and prognosis. The patient understood the recommendations and agrees with the treatment plan. All questions regarding treatment plan were fully answered.  Obadiah Birmingham, MD  12/13/23   History of Present Illness HPI   Megan Bradford is a 73 y.o. female  referred by Dr. Sagardia for evaluation and management of Left adrenal adenoma.   10/2022: CT ABDOMEN AND PELVIS WITHOUT CONTRAST Adrenals: There is a able 2.5 x 2.7 cm left adrenal gland nodule with a density of 36  Hounsfield units. No right adrenal gland nodule. 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  She weight change No moon face No fat pads Yes, only dorsocervical  increased girth Yes plethora No hyperpigmentation No purple striae No proximal muscle weakness No acne No vellus/terminal hirsutism Yes, always had a beard scalp hair loss Yes, some Has a history of HTN a history of hypokalemia No paroxysmal episodes of anxiety No tremors Yes, sometimes  lightheadedness Yes, occasionally  headache No palpitation No sweating Yes blurry vision No  No known cancers of self/family Doctors are watching a granuloma in gynecological area per patient s/p B/L salpingo-oophoectomy and current surveillance  S/p renal transplant in 2016 due to ?HTN Not on any steroids now  Physical Exam  BP 122/80   Pulse 65   Ht 5' 1 (1.549 m)   Wt 189 lb (85.7 kg)   SpO2 98%   BMI 35.71 kg/m    Constitutional: well developed, well nourished Head: normocephalic, atraumatic Eyes: sclera anicteric, no redness Neck: supple Lungs: normal respiratory effort Neurology: alert and oriented Skin: dry, no appreciable rashes Musculoskeletal: no appreciable defects Psychiatric: normal mood and affect   Current Medications Patient's Medications  New Prescriptions   No medications on file  Previous Medications   AMLODIPINE  (NORVASC ) 5 MG TABLET    Take 5 mg by mouth daily.   ASPIRIN  (ASPIRIN  81) 81 MG CHEWABLE TABLET    Chew 1 tablet (81 mg total) by mouth 2 (two) times daily. Take one tab po bid x 6 weeks post-op to  prevent blood clots   ASPIRIN  EC 81 MG TABLET    Take 81 mg by mouth daily. Swallow whole.   CARVEDILOL  (COREG ) 12.5 MG TABLET    Take 12.5 mg by mouth 2 (two) times daily with a meal.   CETIRIZINE (ZYRTEC) 10 MG TABLET    Take 10 mg by mouth daily.   CHOLECALCIFEROL  (VITAMIN D3) 10 MCG (400 UNIT) CAPS    Take 400 Units by mouth daily.   COENZYME Q10  (CO Q10 PO)    Take 1 capsule by mouth daily.   DICLOFENAC  SODIUM (VOLTAREN ) 1 % GEL    Apply 2 g topically 4 (four) times daily.   DIPHENHYDRAMINE -ACETAMINOPHEN  (TYLENOL  PM) 25-500 MG TABS TABLET    Take 1 tablet by mouth at bedtime as needed (As needed).   HYDROCODONE -ACETAMINOPHEN  (NORCO/VICODIN) 5-325 MG TABLET    Take 1 tablet by mouth 3 (three) times daily as needed for moderate pain (pain score 4-6).   LORAZEPAM  (ATIVAN ) 2 MG TABLET    Take 2 mg by mouth 2 (two) times daily as needed.   LOSARTAN (COZAAR) 25 MG TABLET    Take 25 mg by mouth daily.   MAGNESIUM  OXIDE (MAG-OX) 400 MG TABLET    Take 400 mg by mouth daily.   MYCOPHENOLATE  (MYFORTIC ) 180 MG EC TABLET    Take 180 mg by mouth 2 (two) times daily.   ROSUVASTATIN  (CRESTOR ) 20 MG TABLET    Take 1 tablet (20 mg total) by mouth daily.   TACROLIMUS  (PROGRAF ) 1 MG CAPSULE    TAKE 4 CAPSULE BY MOUTH EVERY MORNING AND 2 CAPSULE BY MOUTH EVERY EVENING  Modified Medications   No medications on file  Discontinued Medications   No medications on file    Allergies Allergies  Allergen Reactions   Keflex [Cephalexin] Anaphylaxis    Past Medical History Past Medical History:  Diagnosis Date   Cataract    Phreesia 11/24/2019- removed both   Chronic kidney disease    Phreesia 11/24/2019   Clotting disorder (HCC)    PE 2010 per pt there really was not a PE   Fistula    LUE; was on dialysis for 11 months   Heart murmur    Hypertension    Phreesia 11/24/2019   Kidney transplanted    transplant team [facility] [place])   Pre-diabetes    no meds   Pulmonary embolism (HCC) 2010   never symptomatic    Past Surgical History Past Surgical History:  Procedure Laterality Date   ANAL RECTAL MANOMETRY N/A 08/03/2021   Procedure: ANO RECTAL MANOMETRY;  Surgeon: Eda Iha, MD;  Location: [facility];  Service: Gastroenterology;  Laterality: N/A;   ARTHRODESIS METATARSALPHALANGEAL JOINT (MTPJ) Left 11/07/2023   Procedure: FUSION,  JOINT, GREAT TOE;  Surgeon: Jerri Kay HERO, MD;  Location: [facility];  Service: Orthopedics;  Laterality: Left;  LEFT GREAT TOE ARTHRODESIS   CARDIAC CATHETERIZATION     x2   CESAREAN SECTION N/A    Phreesia 11/24/2019   CHOLECYSTECTOMY N/A    Phreesia 11/24/2019   COLONOSCOPY     DILATATION & CURETTAGE/HYSTEROSCOPY WITH MYOSURE N/A 10/28/2021   Procedure: DILATATION & CURETTAGE/HYSTEROSCOPY;  Surgeon: Gloriann Chick, MD;  Location: [facility];  Service: Gynecology;  Laterality: N/A;   DILATION AND CURETTAGE OF UTERUS     EYE SURGERY N/A    Cataracts removed Phreesia 11/24/2019   HIP SURGERY Left 1990   Hip dysplasia cleaned out per patient  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   LAPAROSCOPIC SALPINGO OOPHERECTOMY Bilateral 10/28/2021   Procedure: LAPAROSCOPIC SALPINGO OOPHORECTOMY WITH PELVIC WASHINGS;  Surgeon: Gloriann Chick, MD;  Location: [facility];  Service: Gynecology;  Laterality: Bilateral;   POLYPECTOMY     HPP   TONSILLECTOMY     TRANSFORAMINAL LUMBAR INTERBODY FUSION (TLIF) WITH PEDICLE SCREW FIXATION 1 LEVEL N/A 12/01/2020   Procedure: Transiforaminal Lumbar Interbody Fusion Lumbar four--Lumbar five;  Surgeon: Debby Dorn MATSU, MD;  Location: [facility] OR;  Service: Neurosurgery;  Laterality: N/A;   TRIGGER FINGER RELEASE Right 10/28/2020   Long finger   VULVAR LESION REMOVAL N/A 10/28/2021   Procedure: COMPLETE RESECTION OF VULVAR WARTS;  Surgeon: Gloriann Chick, MD;  Location: [facility];  Service: Gynecology;  Laterality: N/A;    Family History family history includes Asthma in her mother; Coronary artery disease in her father; Diabetes in her mother; Hypertension in her father.  Social History Social History   Socioeconomic History   Marital status: Widowed    Spouse name: Not on file   Number of children: 2   Years of education: Not on file   Highest education level: Associate degree: occupational, Scientist, product/process development, or  vocational program  Occupational History   Occupation: Retired  Tobacco Use   Smoking status: Never   Smokeless tobacco: Never  Vaping Use   Vaping status: Never Used  Substance and Sexual Activity   Alcohol use: Never   Drug use: Never   Sexual activity: Not Currently    Birth control/protection: None  Other Topics Concern   Not on file  Social History Narrative   Lives alone/2025   Social Drivers of Health   Financial Resource Strain: Medium Risk (11/29/2023)   Overall Financial Resource Strain (CARDIA)    Difficulty of Paying Living Expenses: Somewhat hard  Food Insecurity: No Food Insecurity (11/29/2023)   Hunger Vital Sign    Worried About Running Out of Food in the Last Year: Never true    Ran Out of Food in the Last Year: Never true  Transportation Needs: No Transportation Needs (11/29/2023)   PRAPARE - Administrator, Civil Service (Medical): No    Lack of Transportation (Non-Medical): No  Physical Activity: Insufficiently Active (11/29/2023)   Exercise Vital Sign    Days of Exercise per Week: 3 days    Minutes of Exercise per Session: 20 min  Stress: Stress Concern Present (11/29/2023)   [employer] of Occupational Health - Occupational Stress Questionnaire    Feeling of Stress: To some extent  Social Connections: Unknown (11/29/2023)   Social Connection and Isolation Panel    Frequency of Communication with Friends and Family: More than three times a week    Frequency of Social Gatherings with Friends and Family: Once a week    Attends Religious Services: Patient declined    Database administrator or Organizations: Patient declined    Attends Banker Meetings: Not on file    Marital Status: Widowed  Recent Concern: Social Connections - Socially Isolated (11/08/2023)   Social Connection and Isolation Panel    Frequency of Communication with Friends and Family: More than three times a week    Frequency of Social Gatherings with Friends  and Family: More than three times a week    Attends Religious Services: Never    Database administrator or Organizations: No    Attends Banker Meetings: Never    Marital Status:  Widowed  Intimate Partner Violence: Patient Unable To Answer (11/08/2023)   Humiliation, Afraid, Rape, and Kick questionnaire    Fear of Current or Ex-Partner: Patient unable to answer    Emotionally Abused: Patient unable to answer    Physically Abused: Patient unable to answer    Sexually Abused: Patient unable to answer    Lab Results  Component Value Date   CHOL 143 10/14/2021   Lab Results  Component Value Date   HDL 56 10/14/2021   Lab Results  Component Value Date   LDLCALC 74 10/14/2021   Lab Results  Component Value Date   TRIG 64 10/14/2021   Lab Results  Component Value Date   CHOLHDL 2.6 10/14/2021   Lab Results  Component Value Date   CREATININE 2.04 (H) 11/06/2023   No results found for: GFR    Component Value Date/Time   NA 141 11/06/2023 1030   NA 146 (H) 11/27/2019 1019   K 3.9 11/06/2023 1030   CL 104 11/06/2023 1030   CO2 24 11/06/2023 1030   GLUCOSE 115 (H) 11/06/2023 1030   BUN 20 11/06/2023 1030   BUN 20 11/27/2019 1019   CREATININE 2.04 (H) 11/06/2023 1030   CALCIUM  9.4 11/06/2023 1030   CALCIUM  10.2 06/07/2023 0000   PROT 7.3 09/14/2023 1255   PROT 6.6 11/27/2019 1019   ALBUMIN  3.8 09/14/2023 1255   ALBUMIN  4.1 11/27/2019 1019   AST 18 09/14/2023 1255   ALT 9 09/14/2023 1255   ALKPHOS 53 09/14/2023 1255   BILITOT 1.5 (H) 09/14/2023 1255   BILITOT 0.8 11/27/2019 1019   GFRNONAA 26 (L) 11/06/2023 1030   GFRAA 35 (L) 11/27/2019 1019      Latest Ref Rng & Units 11/06/2023   10:30 AM 09/14/2023   12:55 PM 06/07/2023   12:00 AM  BMP  Glucose 70 - 99 mg/dL 884  868    BUN 8 - 23 mg/dL 20  35    Creatinine 9.55 - 1.00 mg/dL 7.95  8.04    Sodium 864 - 145 mmol/L 141  137    Potassium 3.5 - 5.1 mmol/L 3.9  3.8    Chloride 98 - 111 mmol/L 104   101    CO2 22 - 32 mmol/L 24  26    Calcium  8.9 - 10.3 mg/dL 9.4  9.8  89.7         This result is from an external source.       Component Value Date/Time   WBC 7.8 09/14/2023 1255   RBC 4.83 09/14/2023 1255   HGB 13.7 09/14/2023 1255   HGB 13.6 11/27/2019 1019   HCT 43.7 09/14/2023 1255   HCT 41.3 11/27/2019 1019   PLT 222 09/14/2023 1255   PLT 263 11/27/2019 1019   MCV 90.5 09/14/2023 1255   MCV 86 11/27/2019 1019   MCH 28.4 09/14/2023 1255   MCHC 31.4 09/14/2023 1255   RDW 14.9 09/14/2023 1255   RDW 13.5 11/27/2019 1019   LYMPHSABS 1.1 09/14/2023 1255   LYMPHSABS 1.5 11/27/2019 1019   MONOABS 0.7 09/14/2023 1255   EOSABS 0.1 09/14/2023 1255   EOSABS 0.1 11/27/2019 1019   BASOSABS 0.1 09/14/2023 1255   BASOSABS 0.1 11/27/2019 1019   No results found for: TSH, FREET4       Parts of this note may have been dictated using voice recognition software. There may be variances in spelling and vocabulary which are unintentional. Not all errors are proofread. Please notify the Chartered loss adjuster  if any discrepancies are noted or if the meaning of any statement is not clear.

## 2023-12-17 LAB — METANEPHRINES, PLASMA
Metanephrine, Free: 77 pg/mL — ABNORMAL HIGH (ref <=57)
Normetanephrine, Free: 371 pg/mL — ABNORMAL HIGH (ref <=148)
Total Metanephrines-Plasma: 448 pg/mL — ABNORMAL HIGH (ref <=205)

## 2023-12-17 LAB — BASIC METABOLIC PANEL WITH GFR
BUN/Creatinine Ratio: 18 (calc) (ref 6–22)
BUN: 33 mg/dL — ABNORMAL HIGH (ref 7–25)
CO2: 29 mmol/L (ref 20–32)
Calcium: 9.5 mg/dL (ref 8.6–10.4)
Chloride: 106 mmol/L (ref 98–110)
Creat: 1.86 mg/dL — ABNORMAL HIGH (ref 0.60–1.00)
Glucose, Bld: 95 mg/dL (ref 65–139)
Potassium: 3.9 mmol/L (ref 3.5–5.3)
Sodium: 145 mmol/L (ref 135–146)
eGFR: 29 mL/min/1.73m2 — ABNORMAL LOW (ref >=60)

## 2023-12-17 LAB — ALDOSTERONE + RENIN ACTIVITY W/ RATIO
ALDO / PRA Ratio: 17.6 ratio (ref 0.9–28.9)
Aldosterone: 9 ng/dL
Renin Activity: 0.51 ng/mL/h (ref 0.25–5.82)

## 2023-12-17 LAB — CORTISOL: Cortisol, Plasma: 18.7 ug/dL

## 2023-12-17 LAB — DHEA-SULFATE: DHEA-SO4: 10 ug/dL (ref 4–157)

## 2023-12-17 LAB — ACTH: C206 ACTH: 10 pg/mL (ref 6–50)

## 2023-12-19 ENCOUNTER — Encounter: Admitting: Physician Assistant

## 2023-12-25 ENCOUNTER — Other Ambulatory Visit (INDEPENDENT_AMBULATORY_CARE_PROVIDER_SITE_OTHER): Payer: Self-pay

## 2023-12-25 ENCOUNTER — Ambulatory Visit (INDEPENDENT_AMBULATORY_CARE_PROVIDER_SITE_OTHER): Admitting: Physician Assistant

## 2023-12-25 DIAGNOSIS — M79672 Pain in left foot: Secondary | ICD-10-CM

## 2023-12-25 NOTE — Progress Notes (Signed)
 Post-Op Visit Note   Patient: Megan Bradford           Date of Birth: Mar 22, 1951           MRN: [id number] Visit Date: 12/25/2023 PCP: Purcell Emil Schanz, MD   Assessment & Plan:  Chief Complaint:  Chief Complaint  Patient presents with   Left Foot - Follow-up    Great toe fusion 11/07/2023   Visit Diagnoses:  1. Left foot pain     Plan: Patient is a pleasant 73 year old female who comes in today 6 weeks status post left great toe cheilectomy, date of surgery 11/07/2023.  She initially wore her postoperative shoe until 4 weeks postop but decided to transition to a regular shoe.  She is having some pain which has somewhat improved over the past few weeks.  She is taking over-the-counter pain medicine at night.  Examination of her left foot reveals a fully healed surgical scar without complication.  No tenderness.  She does have moderate swelling to the left foot and ankle.  Calf is soft nontender.  She is neurovascularly intact distally.  At this point, she is not completely fused.  I recommended either going back into her postoperative shoe or putting a carbon fiber plate into a regular shoe.  She is agreeable to this plan.  She will ice and elevate and wear compression socks as needed.  Follow-up in 4 weeks for repeat evaluation and x-rays of the left foot.  Call with concerns or questions.  Follow-Up Instructions: Return in about 6 weeks (around 02/05/2024).   Orders:  Orders Placed This Encounter  Procedures   XR Toe Great Left   No orders of the defined types were placed in this encounter.   Imaging: XR Toe Great Left Result Date: 12/25/2023 X-rays demonstrate stable hardware without complication.  The great toe has not completely fused.   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   Adrenal  mass, 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   Diverticulosis 02/02/2022   Dyslipidemia 10/20/2021   Chronic diastolic CHF (congestive heart failure) (HCC) 10/14/2021   Hyperlipidemia 10/14/2021   Dyssynergic defecation    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/01/2020   Coronary artery disease involving native coronary artery of native heart 08/30/2020   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/30/2020   CKD stage 3b, GFR 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) 12/17/2019   History of kidney transplant 11/27/2019   Essential hypertension 11/27/2019   Prediabetes 11/27/2019   Obesity (BMI 30-39.9) 11/27/2019   Past Medical History:  Diagnosis Date   Cataract    Phreesia 11/24/2019- removed both   Chronic kidney disease    Phreesia 11/24/2019   Clotting disorder (HCC)    PE 2010 per pt there really was not a PE   Fistula    LUE; was on dialysis for 11 months   Heart murmur    Hypertension    Phreesia 11/24/2019   Kidney transplanted    transplant team [facility])   Pre-diabetes    no meds   Pulmonary embolism (HCC) 2010   never symptomatic    Family History  Problem Relation Age of Onset   Diabetes Mother    Asthma Mother    Hypertension Father    Coronary artery disease Father    Colon cancer Neg Hx    Colon polyps Neg Hx  Esophageal cancer Neg Hx    Rectal cancer Neg Hx    Stomach cancer Neg Hx    Breast cancer Neg Hx    Ovarian cancer Neg Hx    Endometrial cancer Neg Hx    Pancreatic cancer Neg Hx    Prostate cancer Neg Hx     Past Surgical History:  Procedure Laterality Date   ANAL RECTAL MANOMETRY N/A 08/03/2021   Procedure: ANO RECTAL MANOMETRY;  Surgeon: Eda Iha, MD;  Location: [facility];  Service: Gastroenterology;  Laterality: N/A;   ARTHRODESIS METATARSALPHALANGEAL JOINT (MTPJ) Left 11/07/2023   Procedure: FUSION, JOINT, GREAT TOE;  Surgeon: Jerri Kay HERO, MD;  Location: [facility];  Service: Orthopedics;  Laterality: Left;  LEFT GREAT TOE ARTHRODESIS   CARDIAC  CATHETERIZATION     x2   CESAREAN SECTION N/A    Phreesia 11/24/2019   CHOLECYSTECTOMY N/A    Phreesia 11/24/2019   COLONOSCOPY     DILATATION & CURETTAGE/HYSTEROSCOPY WITH MYOSURE N/A 10/28/2021   Procedure: DILATATION & CURETTAGE/HYSTEROSCOPY;  Surgeon: Gloriann Chick, MD;  Location: [facility];  Service: Gynecology;  Laterality: N/A;   DILATION AND CURETTAGE OF UTERUS     EYE SURGERY N/A    Cataracts removed Phreesia 11/24/2019   HIP SURGERY Left 1990   Hip dysplasia cleaned out per patient   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   LAPAROSCOPIC SALPINGO OOPHERECTOMY Bilateral 10/28/2021   Procedure: LAPAROSCOPIC SALPINGO OOPHORECTOMY WITH PELVIC WASHINGS;  Surgeon: Gloriann Chick, MD;  Location: [facility];  Service: Gynecology;  Laterality: Bilateral;   POLYPECTOMY     HPP   TONSILLECTOMY     TRANSFORAMINAL LUMBAR INTERBODY FUSION (TLIF) WITH PEDICLE SCREW FIXATION 1 LEVEL N/A 12/01/2020   Procedure: Transiforaminal Lumbar Interbody Fusion Lumbar four--Lumbar five;  Surgeon: Debby Dorn MATSU, MD;  Location: [facility];  Service: Neurosurgery;  Laterality: N/A;   TRIGGER FINGER RELEASE Right 10/28/2020   Long finger   VULVAR LESION REMOVAL N/A 10/28/2021   Procedure: COMPLETE RESECTION OF VULVAR WARTS;  Surgeon: Gloriann Chick, MD;  Location: [facility];  Service: Gynecology;  Laterality: N/A;   Social History   Occupational History   Occupation: Retired  Tobacco Use   Smoking status: Never   Smokeless tobacco: Never  Vaping Use   Vaping status: Never Used  Substance and Sexual Activity   Alcohol use: Never   Drug use: Never   Sexual activity: Not Currently    Birth control/protection: None

## 2024-01-17 ENCOUNTER — Ambulatory Visit: Admitting: "Endocrinology

## 2024-01-18 ENCOUNTER — Ambulatory Visit: Payer: Self-pay

## 2024-01-18 NOTE — Telephone Encounter (Signed)
 FYI Only or Action Required?: Action required by provider: requesting recommendations for current symptoms.  Patient was last seen in primary care on 12/03/2023 by Purcell Emil Schanz, MD.  Called Nurse Triage reporting Immunization Reaction.  Symptoms began Tuesday afternoon.  Interventions attempted: Rest, hydration, or home remedies.  Symptoms are: unchanged.  Triage Disposition: See PCP When Office is Open (Within 3 Days)-asking for a call back from office.   Patient/caregiver understands and will follow disposition?: No, wishes to speak with PCP  Copied from CRM #[id number]. Topic: Clinical - Red Word Triage >> Jan 18, 2024  1:15 PM [place] A wrote: [facility] that prompted transfer to Nurse Triage: Tuesday patient had Shingles Vaccination has swelling, warm to touch, itchy to right arm where site was. Reason for Disposition  [1] Pain, tenderness, or swelling at the injection site AND [2] persists > 3 days  Answer Assessment - Initial Assessment Questions 1. SYMPTOMS: What is the main symptom? (e.g., pain, redness, or swelling at injection site; feeling tired, fever, muscle aches)      Swelling at injection site, warmth to the touch and itching 2. ONSET: When was the vaccine (shot) given? How much later did the swelling, warmth to the touch and itching begin? (e.g., hours, days ago)      Received Shingles and MMR vaccination on Tuesday in same arm around 11:00 AM at [facility]. Patient reports she noticed symptoms around 2:00 PM 3. SEVERITY: How bad is it?      Moderate 4. FEVER: Do you have a fever? If Yes, ask: What is your temperature, how was it measured, and when did it start?      no 5. IMMUNIZATIONS GIVEN: What shots have you recently received?     Shingles, MMR 6. PAST REACTIONS: Have you reacted to immunizations before? If Yes, ask: What happened?     no 7. OTHER SYMPTOMS: Do you have any other symptoms?     Itching  Patient states she feels like the  symptoms have gotten slightly better. Patient would prefer to not have to be seen in the office. Asking for a call back from office with recommendations.  Protocols used: Immunization Reactions-A-AH

## 2024-01-18 NOTE — Telephone Encounter (Signed)
 Spoke with patient and informed patient of the provider of day message regarding the vaccine reaction and what she should be aware of if symptoms worsen. She understood

## 2024-01-18 NOTE — Telephone Encounter (Signed)
 Ok to let pt know to not take the MMR or shingles shot again  [facility] for otc topical benadryl  cream, and consider ov for worsening redness , swelling or fever

## 2024-01-22 ENCOUNTER — Encounter (HOSPITAL_COMMUNITY): Payer: Self-pay

## 2024-01-22 ENCOUNTER — Other Ambulatory Visit (HOSPITAL_COMMUNITY): Payer: Self-pay

## 2024-01-22 ENCOUNTER — Telehealth: Payer: Self-pay

## 2024-01-22 ENCOUNTER — Telehealth (HOSPITAL_COMMUNITY): Payer: Self-pay | Admitting: *Deleted

## 2024-01-22 MED ORDER — LORAZEPAM 1 MG PO TABS
1.0000 mg | ORAL_TABLET | ORAL | 0 refills | Status: DC
  Filled 2024-01-22: qty 1, 1d supply, fill #0

## 2024-01-22 MED ORDER — LORAZEPAM 1 MG PO TABS: ORAL_TABLET | ORAL | 0 refills | Status: DC

## 2024-01-22 NOTE — Telephone Encounter (Signed)
 Called patient to let her know she could take Ativan  1 mg by mouth before her test. Will call in to [facility] for patient to pick up prior to her test tomorrow.

## 2024-01-22 NOTE — Telephone Encounter (Signed)
 Reaching out to patient to offer assistance regarding upcoming cardiac imaging study; pt verbalizes understanding of appt date/time, parking situation and where to check in, pre-test NPO status and verified current allergies; name and call back number provided for further questions should they arise  Chantal Requena RN Navigator Cardiac Imaging [facility] [phone] office [phone] cell  Patient aware to avoid caffeine 12 hours prior to cardiac PET scan. Pt reports being claustrophobic. I have reached out to Dr. Delford nurse about arranging some anti-anxiety medications.  Pt states she will have ride to the appointment.

## 2024-01-23 ENCOUNTER — Ambulatory Visit: Payer: Self-pay | Admitting: Cardiovascular Disease

## 2024-01-23 ENCOUNTER — Encounter (HOSPITAL_COMMUNITY)
Admission: RE | Admit: 2024-01-23 | Discharge: 2024-01-23 | Disposition: A | Discharge status: Home / Self Care | Source: Ambulatory Visit | Attending: Cardiovascular Disease | Admitting: Cardiovascular Disease

## 2024-01-23 ENCOUNTER — Other Ambulatory Visit: Payer: Self-pay

## 2024-01-23 DIAGNOSIS — R072 Precordial pain: Secondary | ICD-10-CM | POA: Insufficient documentation

## 2024-01-23 LAB — NM PET CT CARDIAC PERFUSION MULTI W/ABSOLUTE BLOODFLOW
LV dias vol: 73 mL (ref 46–106)
LV sys vol: 28 mL (ref 3.8–5.2)
MBFR: 2.13
Nuc Rest EF: 62 %
Nuc Stress EF: 62 %
Rest MBF: 0.98 ml/g/min
Rest Nuclear Isotope Dose: 22.4 mCi
ST Depression (mm): 0 mm
Stress MBF: 2.09 ml/g/min
Stress Nuclear Isotope Dose: 22 mCi

## 2024-01-23 MED ORDER — REGADENOSON 0.4 MG/5ML IV SOLN
0.4000 mg | Freq: Once | INTRAVENOUS | Status: AC
Start: 2024-01-23 — End: 2024-01-23
  Administered 2024-01-23: 0.4 mg via INTRAVENOUS

## 2024-01-23 MED ORDER — REGADENOSON 0.4 MG/5ML IV SOLN
INTRAVENOUS | Status: AC
  Filled 2024-01-23: qty 5

## 2024-01-23 MED ORDER — RUBIDIUM RB82 GENERATOR (RUBYFILL)
22.3800 | PACK | Freq: Once | INTRAVENOUS | Status: AC
Start: 2024-01-23 — End: 2024-01-23
  Administered 2024-01-23: 22.38 via INTRAVENOUS

## 2024-01-23 MED ORDER — RUBIDIUM RB82 GENERATOR (RUBYFILL)
22.0200 | PACK | Freq: Once | INTRAVENOUS | Status: AC
Start: 2024-01-23 — End: 2024-01-23
  Administered 2024-01-23: 22.02 via INTRAVENOUS

## 2024-01-31 DIAGNOSIS — N2581 Secondary hyperparathyroidism of renal origin: Secondary | ICD-10-CM | POA: Diagnosis not present

## 2024-01-31 DIAGNOSIS — Z79899 Other long term (current) drug therapy: Secondary | ICD-10-CM | POA: Diagnosis not present

## 2024-01-31 DIAGNOSIS — R809 Proteinuria, unspecified: Secondary | ICD-10-CM | POA: Diagnosis not present

## 2024-01-31 DIAGNOSIS — I129 Hypertensive chronic kidney disease with stage 1 through stage 4 chronic kidney disease, or unspecified chronic kidney disease: Secondary | ICD-10-CM | POA: Diagnosis not present

## 2024-01-31 DIAGNOSIS — N1832 Chronic kidney disease, stage 3b: Secondary | ICD-10-CM | POA: Diagnosis not present

## 2024-01-31 DIAGNOSIS — R7303 Prediabetes: Secondary | ICD-10-CM | POA: Diagnosis not present

## 2024-01-31 DIAGNOSIS — E785 Hyperlipidemia, unspecified: Secondary | ICD-10-CM | POA: Diagnosis not present

## 2024-01-31 DIAGNOSIS — Z940 Kidney transplant status: Secondary | ICD-10-CM | POA: Diagnosis not present

## 2024-02-01 ENCOUNTER — Other Ambulatory Visit: Payer: Self-pay | Admitting: Cardiovascular Disease

## 2024-02-05 ENCOUNTER — Other Ambulatory Visit (INDEPENDENT_AMBULATORY_CARE_PROVIDER_SITE_OTHER)

## 2024-02-05 ENCOUNTER — Ambulatory Visit (INDEPENDENT_AMBULATORY_CARE_PROVIDER_SITE_OTHER): Admitting: Physician Assistant

## 2024-02-05 DIAGNOSIS — M79672 Pain in left foot: Secondary | ICD-10-CM | POA: Diagnosis not present

## 2024-02-05 NOTE — Progress Notes (Signed)
 Post-Op Visit Note   Patient: Megan Bradford           Date of Birth: [birth year]           MRN: [id number] Visit Date: 02/05/2024 PCP: Purcell Emil Schanz, MD   Assessment & Plan:  Chief Complaint:  Chief Complaint  Patient presents with   Left Foot - Follow-up    Great toe fusion 11/07/2023   Visit Diagnoses:  1. Left foot pain     Plan: Patient is a pleasant 73 year old female who comes in today 3 months status post left great toe fusion 11/07/2023.  She is not in any pain but does note decreased sensation to the medial aspect of her great toe and forefoot.  She has been walking in her postoperative shoe but has also tried walking without this without any issues.  Overall feeling well.  Examination of the left foot reveals a fully healed surgical scar without complication.  She does have decreased sensation along the outside of the great toe.  At this point, she has not completely fused.  I have discussed that this could take several more months.  She will ambulate with or without the shoe based on her symptoms.  She will follow-up in 3 months for repeat evaluation and three-view x-rays of the left foot.  Call with concerns or questions.  Follow-Up Instructions: Return in about 3 months (around 05/06/2024).   Orders:  Orders Placed This Encounter  Procedures   XR Foot Complete Left   No orders of the defined types were placed in this encounter.   Imaging: XR Foot Complete Left Result Date: 02/05/2024 X-rays demonstrate consolidation to the lateral aspect of the fusion.  No hardware complication.   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   Adrenal mass, left 02/02/2022   Diverticulosis 02/02/2022   Dyslipidemia 10/20/2021   Chronic diastolic CHF (congestive heart failure) (HCC) 10/14/2021    Hyperlipidemia 10/14/2021   Dyssynergic defecation    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/01/2020   Coronary artery disease involving native coronary artery of native heart 08/30/2020   Immunosuppression 08/30/2020   CKD stage 3b, GFR 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) 12/17/2019   History of kidney transplant 11/27/2019   Essential hypertension 11/27/2019   Prediabetes 11/27/2019   Obesity (BMI 30-39.9) 11/27/2019   Past Medical History:  Diagnosis Date   Cataract    Phreesia 11/24/2019- removed both   Chronic kidney disease    Phreesia 11/24/2019   Clotting disorder    PE 2010 per pt there really was not a PE   Fistula    LUE; was on dialysis for 11 months   Heart murmur    Hypertension    Phreesia 11/24/2019   Kidney transplanted    transplant team [facility] [place])   Pre-diabetes    no meds   Pulmonary embolism (HCC) 2010   never symptomatic    Family History  Problem Relation Age of Onset   Diabetes Mother    Asthma Mother    Hypertension Father    Coronary artery disease Father    Colon cancer Neg Hx    Colon polyps Neg Hx    Esophageal cancer Neg Hx    Rectal cancer Neg Hx    Stomach cancer Neg Hx    Breast cancer Neg Hx  Ovarian cancer Neg Hx    Endometrial cancer Neg Hx    Pancreatic cancer Neg Hx    Prostate cancer Neg Hx     Past Surgical History:  Procedure Laterality Date   ANAL RECTAL MANOMETRY N/A 08/03/2021   Procedure: ANO RECTAL MANOMETRY;  Surgeon: Eda Iha, MD;  Location: [facility];  Service: Gastroenterology;  Laterality: N/A;   ARTHRODESIS METATARSALPHALANGEAL JOINT (MTPJ) Left 11/07/2023   Procedure: FUSION, JOINT, GREAT TOE;  Surgeon: Jerri Kay HERO, MD;  Location: [facility];  Service: Orthopedics;  Laterality: Left;  LEFT GREAT TOE ARTHRODESIS   CARDIAC CATHETERIZATION     x2   CESAREAN SECTION N/A    Phreesia 11/24/2019   CHOLECYSTECTOMY N/A    Phreesia 11/24/2019   COLONOSCOPY     DILATATION &  CURETTAGE/HYSTEROSCOPY WITH MYOSURE N/A 10/28/2021   Procedure: DILATATION & CURETTAGE/HYSTEROSCOPY;  Surgeon: Gloriann Chick, MD;  Location: [facility];  Service: Gynecology;  Laterality: N/A;   DILATION AND CURETTAGE OF UTERUS     EYE SURGERY N/A    Cataracts removed Phreesia 11/24/2019   HIP SURGERY Left 1990   Hip dysplasia cleaned out per patient   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   LAPAROSCOPIC SALPINGO OOPHERECTOMY Bilateral 10/28/2021   Procedure: LAPAROSCOPIC SALPINGO OOPHORECTOMY WITH PELVIC WASHINGS;  Surgeon: Gloriann Chick, MD;  Location: [facility];  Service: Gynecology;  Laterality: Bilateral;   POLYPECTOMY     HPP   TONSILLECTOMY     TRANSFORAMINAL LUMBAR INTERBODY FUSION (TLIF) WITH PEDICLE SCREW FIXATION 1 LEVEL N/A 12/01/2020   Procedure: Transiforaminal Lumbar Interbody Fusion Lumbar four--Lumbar five;  Surgeon: Debby Dorn MATSU, MD;  Location[facility];  Service: Neurosurgery;  Laterality: N/A;   TRIGGER FINGER RELEASE Right 10/28/2020   Long finger   VULVAR LESION REMOVAL N/A 10/28/2021   Procedure: COMPLETE RESECTION OF VULVAR WARTS;  Surgeon: Gloriann Chick, MD;  Location: [facility];  Service: Gynecology;  Laterality: N/A;   Social History   Occupational History   Occupation: Retired  Tobacco Use   Smoking status: Never   Smokeless tobacco: Never  Vaping Use   Vaping status: Never Used  Substance and Sexual Activity   Alcohol use: Never   Drug use: Never   Sexual activity: Not Currently    Birth control/protection: None

## 2024-02-08 ENCOUNTER — Telehealth: Payer: Self-pay | Admitting: *Deleted

## 2024-02-08 NOTE — Telephone Encounter (Signed)
 Copied from CRM [id number]. Topic: Appointments - Appointment Info/Confirmation >> Feb 08, 2024  8:41 AM Miquel SAILOR wrote: Patient/patient representative is calling for information regarding an appointment.   Laverne from Transport[employer]/[phone] confirming time 10/01 at 9:20am. No furhter questions

## 2024-02-13 ENCOUNTER — Encounter: Payer: Self-pay | Admitting: "Endocrinology

## 2024-02-13 ENCOUNTER — Ambulatory Visit (INDEPENDENT_AMBULATORY_CARE_PROVIDER_SITE_OTHER): Admitting: "Endocrinology

## 2024-02-13 VITALS — BP 110/70 | HR 64 | Ht 61.0 in | Wt 188.0 lb

## 2024-02-13 DIAGNOSIS — E278 Other specified disorders of adrenal gland: Secondary | ICD-10-CM

## 2024-02-13 NOTE — Patient Instructions (Signed)
 Instruct the patient to fast. The patient should fast overnight (typically 8-12 hours) and should not eat or drink anything but water. Consuming certain foods (e.g., bananas and nuts) or beverages (e.g., coffee and tea) can affect catecholamine levels. Manage medications. The patient should talk to their doctor about temporarily stopping any medications that can interfere with the results. This includes tricyclic antidepressants, certain anxiety and ADHD medications, and acetaminophen . Do not stop any prescribed medication without a doctor's supervision. Eliminate stimulants. The patient should avoid nicotine, caffeine, alcohol, and illicit drugs for at least 24 hours before the test. Limit physical activity. The patient should not exercise or engage in strenuous activity before the blood draw. Reduce stress. The patient should be kept calm and relaxed during the process. Any anxiety or stress can trigger a release of catecholamines.

## 2024-02-13 NOTE — Progress Notes (Signed)
 Outpatient Endocrinology Note Megan Birmingham, MD    Megan Bradford 09-Oct-1950 [id number]  Referring Provider: Purcell Emil Schanz, * Primary Care Provider: Purcell Emil Schanz, MD Reason for consultation: Subjective   Assessment & Plan  Diagnoses and all orders for this visit:  Adrenal mass, left -     Cancel: Metanephrines, plasma; Future -     CT ABDOMEN WO CONTRAST; Future -     Metanephrines, plasma; Future    Here for evaluation and management of Left adrenal adenoma  01/13/2022 Adrenals/Urinary Tract: There is a 2.5 cm indeterminate left adrenal nodule. This can be better characterized with MRI on a non-emergent/outpatient basis. The native kidneys are atrophic. There is a right lower quadrant renal transplant. 10/2022: CT ABDOMEN AND PELVIS WITHOUT CONTRAST Adrenals: There is a stable 2.5 x 2.7 cm left adrenal gland nodule with a density of 36 Hounsfield units. No adrenal gland nodule. 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 12/13/2023 baseline labs reported elevated plasma metanephrines and normetanephrine's, other labs normal; not cushingoid features hence dexamethasone  suppression test was not done at this point 02/13/24: Ordered repeat, spoke to the lab technician to make sure the lab Is drawn appropriately and given their instructions to the patient to follow-up the day/night prior of the lab  to assure accuracy; ordered repeat CT abdomen (no contrast given low GFR)  Return in about 3 months (around 05/15/2024) for visit and 8 am labs before next visit.   I have reviewed current medications, nurse's notes, allergies, vital signs, past medical and surgical history, family medical history, and social history for this encounter. Counseled patient on symptoms, examination findings, lab findings, imaging results, treatment decisions and monitoring and prognosis. The patient understood the recommendations and agrees with the  treatment plan. All questions regarding treatment plan were fully answered.  Megan Birmingham, MD  02/13/24   History of Present Illness HPI   Megan Bradford is a 73 y.o. female  referred by Dr. Purcell for of Left adrenal adenoma.   Weight is stable Has a lot of stress/anxiety No abdominal pain/nausea/vomiting/diarrhea No palpitations/tremors/light headedness/blurry vision/head aches    Initial history: 10/2022: CT ABDOMEN AND PELVIS WITHOUT CONTRAST Adrenals: There is a able 2.5 x 2.7 cm left adrenal gland nodule with a density of 36 Hounsfield units. No right adrenal gland nodule. 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  She weight change No moon face No fat pads Yes, only dorsocervical  increased girth Yes plethora No hyperpigmentation No purple striae No proximal muscle weakness No acne No vellus/terminal hirsutism Yes, always had a beard scalp hair loss Yes, some Has a history of HTN a history of hypokalemia No paroxysmal episodes of anxiety No tremors Yes, sometimes  lightheadedness Yes, occasionally  headache No palpitation No sweating Yes blurry vision No  No known cancers of self/family Doctors are watching a granuloma in gynecological area per patient s/p B/L salpingo-oophoectomy and current surveillance  S/p renal transplant in 2016 due to ?HTN Not on any steroids now  Physical Exam  BP 110/70   Pulse 64   Ht 5' 1 (1.549 m)   Wt 188 lb (85.3 kg)   SpO2 98%   BMI 35.52 kg/m    Constitutional: well developed, well nourished Head: normocephalic, atraumatic Eyes: sclera anicteric, no redness Neck: supple Lungs: normal respiratory effort Neurology: alert and oriented Skin: dry, no appreciable rashes Musculoskeletal: no appreciable defects Psychiatric: normal mood and affect  Current Medications Patient's Medications  New Prescriptions   No medications on file  Previous Medications    AMLODIPINE  (NORVASC ) 5 MG TABLET    Take 5 mg by mouth daily.   ASPIRIN  (ASPIRIN  81) 81 MG CHEWABLE TABLET    Chew 1 tablet (81 mg total) by mouth 2 (two) times daily. Take one tab po bid x 6 weeks post-op to prevent blood clots   ASPIRIN  EC 81 MG TABLET    Take 81 mg by mouth daily. Swallow whole.   CARVEDILOL  (COREG ) 12.5 MG TABLET    Take 12.5 mg by mouth 2 (two) times daily with a meal.   CETIRIZINE (ZYRTEC) 10 MG TABLET    Take 10 mg by mouth daily.   CHOLECALCIFEROL  (VITAMIN D3) 10 MCG (400 UNIT) CAPS    Take 400 Units by mouth daily.   COENZYME Q10 (CO Q10 PO)    Take 1 capsule by mouth daily.   DICLOFENAC  SODIUM (VOLTAREN ) 1 % GEL    Apply 2 g topically 4 (four) times daily.   DIPHENHYDRAMINE -ACETAMINOPHEN  (TYLENOL  PM) 25-500 MG TABS TABLET    Take 1 tablet by mouth at bedtime as needed (As needed).   HYDROCODONE -ACETAMINOPHEN  (NORCO/VICODIN) 5-325 MG TABLET    Take 1 tablet by mouth 3 (three) times daily as needed for moderate pain (pain score 4-6).   LORAZEPAM  (ATIVAN ) 1 MG TABLET    Take one tablet by mouth prior to your Cardiac CT   LORAZEPAM  (ATIVAN ) 1 MG TABLET    Take 1 tablet (1 mg total) by mouth prior to test on 01/23/24.   LORAZEPAM  (ATIVAN ) 2 MG TABLET    Take 2 mg by mouth 2 (two) times daily as needed.   LOSARTAN (COZAAR) 25 MG TABLET    Take 25 mg by mouth daily.   MAGNESIUM  OXIDE (MAG-OX) 400 MG TABLET    Take 400 mg by mouth daily.   MYCOPHENOLATE  (MYFORTIC ) 180 MG EC TABLET    Take 180 mg by mouth 2 (two) times daily.   ROSUVASTATIN  (CRESTOR ) 20 MG TABLET    TAKE 1 TABLET(20 MG) BY MOUTH DAILY   TACROLIMUS  (PROGRAF ) 1 MG CAPSULE    TAKE 4 CAPSULE BY MOUTH EVERY MORNING AND 2 CAPSULE BY MOUTH EVERY EVENING  Modified Medications   No medications on file  Discontinued Medications   No medications on file    Allergies Allergies  Allergen Reactions   Keflex [Cephalexin] Anaphylaxis    Past Medical History Past Medical History:  Diagnosis Date   Cataract     Phreesia 11/24/2019- removed both   Chronic kidney disease    Phreesia 11/24/2019   Clotting disorder    PE 2010 per pt there really was not a PE   Fistula    LUE; was on dialysis for 11 months   Heart murmur    Hypertension    Phreesia 11/24/2019   Kidney transplanted    transplant team [facility] [place])   Pre-diabetes    no meds   Pulmonary embolism (HCC) 2010   never symptomatic    Past Surgical History Past Surgical History:  Procedure Laterality Date   ANAL RECTAL MANOMETRY N/A 08/03/2021   Procedure: ANO RECTAL MANOMETRY;  Surgeon: Eda Iha, MD;  Location: [facility];  Service: Gastroenterology;  Laterality: N/A;   ARTHRODESIS METATARSALPHALANGEAL JOINT (MTPJ) Left 11/07/2023   Procedure: FUSION, JOINT, GREAT TOE;  Surgeon: Jerri Kay HERO, MD;  Location: [facility];  Service: Orthopedics;  Laterality: Left;  LEFT GREAT TOE ARTHRODESIS   CARDIAC CATHETERIZATION     x2   CESAREAN SECTION N/A    Phreesia 11/24/2019   CHOLECYSTECTOMY N/A    Phreesia 11/24/2019   COLONOSCOPY     DILATATION & CURETTAGE/HYSTEROSCOPY WITH MYOSURE N/A 10/28/2021   Procedure: DILATATION & CURETTAGE/HYSTEROSCOPY;  Surgeon: Gloriann Chick, MD;  Location: [facility];  Service: Gynecology;  Laterality: N/A;   DILATION AND CURETTAGE OF UTERUS     EYE SURGERY N/A    Cataracts removed Phreesia 11/24/2019   HIP SURGERY Left 1990   Hip dysplasia cleaned out per patient   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   LAPAROSCOPIC SALPINGO OOPHERECTOMY Bilateral 10/28/2021   Procedure: LAPAROSCOPIC SALPINGO OOPHORECTOMY WITH PELVIC WASHINGS;  Surgeon: Gloriann Chick, MD;  Location: [facility];  Service: Gynecology;  Laterality: Bilateral;   POLYPECTOMY     HPP   TONSILLECTOMY     TRANSFORAMINAL LUMBAR INTERBODY FUSION (TLIF) WITH PEDICLE SCREW FIXATION 1 LEVEL N/A 12/01/2020   Procedure: Transiforaminal Lumbar Interbody Fusion Lumbar four--Lumbar five;  Surgeon:  Debby Dorn MATSU, MD;  Location[facility];  Service: Neurosurgery;  Laterality: N/A;   TRIGGER FINGER RELEASE Right 10/28/2020   Long finger   VULVAR LESION REMOVAL N/A 10/28/2021   Procedure: COMPLETE RESECTION OF VULVAR WARTS;  Surgeon: Gloriann Chick, MD;  Location: [facility];  Service: Gynecology;  Laterality: N/A;    Family History family history includes Asthma in her mother; Coronary artery disease in her father; Diabetes in her mother; Hypertension in her father.  Social History Social History   Socioeconomic History   Marital status: Widowed    Spouse name: Not on file   Number of children: 2   Years of education: Not on file   Highest education level: Associate degree: occupational, Scientist, product/process development, or vocational program  Occupational History   Occupation: Retired  Tobacco Use   Smoking status: Never   Smokeless tobacco: Never  Vaping Use   Vaping status: Never Used  Substance and Sexual Activity   Alcohol use: Never   Drug use: Never   Sexual activity: Not Currently    Birth control/protection: None  Other Topics Concern   Not on file  Social History Narrative   Lives alone/2025   Social Drivers of Health   Financial Resource Strain: Medium Risk (11/29/2023)   Overall Financial Resource Strain (CARDIA)    Difficulty of Paying Living Expenses: Somewhat hard  Food Insecurity: No Food Insecurity (11/29/2023)   Hunger Vital Sign    Worried About Running Out of Food in the Last Year: Never true    Ran Out of Food in the Last Year: Never true  Transportation Needs: No Transportation Needs (11/29/2023)   PRAPARE - Administrator, Civil Service (Medical): No    Lack of Transportation (Non-Medical): No  Physical Activity: Insufficiently Active (11/29/2023)   Exercise Vital Sign    Days of Exercise per Week: 3 days    Minutes of Exercise per Session: 20 min  Stress: Stress Concern Present (11/29/2023)   [employer] of Occupational Health -  Occupational Stress Questionnaire    Feeling of Stress: To some extent  Social Connections: Unknown (11/29/2023)   Social Connection and Isolation Panel    Frequency of Communication with Friends and Family: More than three times a week    Frequency of Social Gatherings with Friends and Family: Once a week    Attends Religious Services: Patient declined  Active Member of Clubs or Organizations: Patient declined    Attends Banker Meetings: Not on file    Marital Status: Widowed  Recent Concern: Social Connections - Socially Isolated (11/08/2023)   Social Connection and Isolation Panel    Frequency of Communication with Friends and Family: More than three times a week    Frequency of Social Gatherings with Friends and Family: More than three times a week    Attends Religious Services: Never    Database administrator or Organizations: No    Attends Banker Meetings: Never    Marital Status: Widowed  Intimate Partner Violence: Patient Unable To Answer (11/08/2023)   Humiliation, Afraid, Rape, and Kick questionnaire    Fear of Current or Ex-Partner: Patient unable to answer    Emotionally Abused: Patient unable to answer    Physically Abused: Patient unable to answer    Sexually Abused: Patient unable to answer    Lab Results  Component Value Date   CHOL 143 10/14/2021   Lab Results  Component Value Date   HDL 56 10/14/2021   Lab Results  Component Value Date   LDLCALC 74 10/14/2021   Lab Results  Component Value Date   TRIG 64 10/14/2021   Lab Results  Component Value Date   CHOLHDL 2.6 10/14/2021   Lab Results  Component Value Date   CREATININE 1.86 (H) 12/13/2023   No results found for: GFR    Component Value Date/Time   NA 145 12/13/2023 1004   NA 146 (H) 11/27/2019 1019   K 3.9 12/13/2023 1004   CL 106 12/13/2023 1004   CO2 29 12/13/2023 1004   GLUCOSE 95 12/13/2023 1004   BUN 33 (H) 12/13/2023 1004   BUN 20 11/27/2019 1019    CREATININE 1.86 (H) 12/13/2023 1004   CALCIUM  9.5 12/13/2023 1004   CALCIUM  10.2 06/07/2023 0000   PROT 7.3 09/14/2023 1255   PROT 6.6 11/27/2019 1019   ALBUMIN  3.8 09/14/2023 1255   ALBUMIN  4.1 11/27/2019 1019   AST 18 09/14/2023 1255   ALT 9 09/14/2023 1255   ALKPHOS 53 09/14/2023 1255   BILITOT 1.5 (H) 09/14/2023 1255   BILITOT 0.8 11/27/2019 1019   GFRNONAA 26 (L) 11/06/2023 1030   GFRAA 35 (L) 11/27/2019 1019      Latest Ref Rng & Units 12/13/2023   10:04 AM 11/06/2023   10:30 AM 09/14/2023   12:55 PM  BMP  Glucose 65 - 139 mg/dL 95  884  868   BUN 7 - 25 mg/dL 33  20  35   Creatinine 0.60 - 1.00 mg/dL 8.13  7.95  8.04   BUN/Creat Ratio 6 - 22 (calc) 18     Sodium 135 - 146 mmol/L 145  141  137   Potassium 3.5 - 5.3 mmol/L 3.9  3.9  3.8   Chloride 98 - 110 mmol/L 106  104  101   CO2 20 - 32 mmol/L 29  24  26    Calcium  8.6 - 10.4 mg/dL 9.5  9.4  9.8        Component Value Date/Time   WBC 7.8 09/14/2023 1255   RBC 4.83 09/14/2023 1255   HGB 13.7 09/14/2023 1255   HGB 13.6 11/27/2019 1019   HCT 43.7 09/14/2023 1255   HCT 41.3 11/27/2019 1019   PLT 222 09/14/2023 1255   PLT 263 11/27/2019 1019   MCV 90.5 09/14/2023 1255   MCV 86 11/27/2019 1019   MCH  28.4 09/14/2023 1255   MCHC 31.4 09/14/2023 1255   RDW 14.9 09/14/2023 1255   RDW 13.5 11/27/2019 1019   LYMPHSABS 1.1 09/14/2023 1255   LYMPHSABS 1.5 11/27/2019 1019   MONOABS 0.7 09/14/2023 1255   EOSABS 0.1 09/14/2023 1255   EOSABS 0.1 11/27/2019 1019   BASOSABS 0.1 09/14/2023 1255   BASOSABS 0.1 11/27/2019 1019   No results found for: TSH, FREET4       Parts of this note may have been dictated using voice recognition software. There may be variances in spelling and vocabulary which are unintentional. Not all errors are proofread. Please notify the dino if any discrepancies are noted or if the meaning of any statement is not clear.

## 2024-02-28 ENCOUNTER — Ambulatory Visit (HOSPITAL_COMMUNITY)
Admission: RE | Admit: 2024-02-28 | Discharge: 2024-02-28 | Disposition: A | Discharge status: Home / Self Care | Source: Ambulatory Visit | Attending: "Endocrinology | Admitting: "Endocrinology

## 2024-02-28 DIAGNOSIS — K5730 Diverticulosis of large intestine without perforation or abscess without bleeding: Secondary | ICD-10-CM | POA: Insufficient documentation

## 2024-02-28 DIAGNOSIS — D3502 Benign neoplasm of left adrenal gland: Secondary | ICD-10-CM | POA: Diagnosis not present

## 2024-02-28 DIAGNOSIS — E278 Other specified disorders of adrenal gland: Secondary | ICD-10-CM | POA: Insufficient documentation

## 2024-02-28 DIAGNOSIS — N261 Atrophy of kidney (terminal): Secondary | ICD-10-CM | POA: Insufficient documentation

## 2024-02-28 DIAGNOSIS — Z940 Kidney transplant status: Secondary | ICD-10-CM | POA: Insufficient documentation

## 2024-02-28 DIAGNOSIS — Z981 Arthrodesis status: Secondary | ICD-10-CM | POA: Diagnosis not present

## 2024-03-17 ENCOUNTER — Encounter: Payer: Self-pay | Admitting: Radiology

## 2024-03-26 ENCOUNTER — Other Ambulatory Visit: Payer: Self-pay | Admitting: Cardiology

## 2024-04-25 ENCOUNTER — Other Ambulatory Visit: Payer: Self-pay | Admitting: Emergency Medicine

## 2024-05-06 ENCOUNTER — Ambulatory Visit: Admitting: Physician Assistant

## 2024-05-06 ENCOUNTER — Other Ambulatory Visit: Payer: Self-pay

## 2024-05-06 DIAGNOSIS — M79672 Pain in left foot: Secondary | ICD-10-CM

## 2024-05-06 DIAGNOSIS — E278 Other specified disorders of adrenal gland: Secondary | ICD-10-CM

## 2024-05-06 NOTE — Progress Notes (Signed)
 "  Post-Op Visit Note   Patient: Megan Bradford           Date of Birth: 06-10-1950           MRN: [id number] Visit Date: 05/06/2024 PCP: Purcell Emil Schanz, MD   Assessment & Plan:  Chief Complaint:  Chief Complaint  Patient presents with   Left Foot - Follow-up    GT fusion 11/07/2023   Visit Diagnoses:  1. Left foot pain     Plan: Patient is a pleasant 73 year old female who comes in today approximately 6 months status post left great toe fusion.  She notes minimal improvement in symptoms over the past few months.  She takes Tylenol  as needed for pain.  Examination of the left foot reveals a fully healed surgical scar without complication.  At this point, have offered the patient a CT scan to further assess the fusion.  She would like to hold off and give it more time for now.  She will follow-up in 3 months for repeat evaluation and left foot x-rays.  If at any point between now and then she would like to get the CT scan, she will call and let us  know.  Call with any concerns or questions.  Follow-Up Instructions: Return in about 3 months (around 08/04/2024).   Orders:  Orders Placed This Encounter  Procedures   XR Foot Complete Left   No orders of the defined types were placed in this encounter.   Imaging: XR Foot Complete Left Result Date: 05/06/2024 Xrays demonstrate consolidation of the fusion closest to the medial aspect of the foot.  Stable hardware.   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   Adrenal mass, left 02/02/2022   Diverticulosis 02/02/2022   Dyslipidemia 10/20/2021   Chronic diastolic CHF (congestive heart failure) (HCC) 10/14/2021   Hyperlipidemia 10/14/2021   Dyssynergic defecation    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/01/2020   Coronary artery disease involving  native coronary artery of native heart 08/30/2020   Immunosuppression 08/30/2020   CKD stage 3b, GFR 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) 12/17/2019   History of kidney transplant 11/27/2019   Essential hypertension 11/27/2019   Prediabetes 11/27/2019   Obesity (BMI 30-39.9) 11/27/2019   Past Medical History:  Diagnosis Date   Cataract    Phreesia 11/24/2019- removed both   Chronic kidney disease    Phreesia 11/24/2019   Clotting disorder    PE 2010 per pt there really was not a PE   Fistula    LUE; was on dialysis for 11 months   Heart murmur    Hypertension    Phreesia 11/24/2019   Kidney transplanted    transplant team [facility])   Pre-diabetes    no meds   Pulmonary embolism (HCC) 2010   never symptomatic    Family History  Problem Relation Age of Onset   Diabetes Mother    Asthma Mother    Hypertension Father    Coronary artery disease Father    Colon cancer Neg Hx    Colon polyps Neg Hx    Esophageal cancer Neg Hx    Rectal cancer Neg Hx    Stomach cancer Neg Hx    Breast cancer Neg Hx    Ovarian cancer Neg Hx    Endometrial cancer Neg Hx  Pancreatic cancer Neg Hx    Prostate cancer Neg Hx     Past Surgical History:  Procedure Laterality Date   ANAL RECTAL MANOMETRY N/A 08/03/2021   Procedure: ANO RECTAL MANOMETRY;  Surgeon: Eda Iha, MD;  Location: [facility];  Service: Gastroenterology;  Laterality: N/A;   ARTHRODESIS METATARSALPHALANGEAL JOINT (MTPJ) Left 11/07/2023   Procedure: FUSION, JOINT, GREAT TOE;  Surgeon: Jerri Kay HERO, MD;  Location: [facility];  Service: Orthopedics;  Laterality: Left;  LEFT GREAT TOE ARTHRODESIS   CARDIAC CATHETERIZATION     x2   CESAREAN SECTION N/A    Phreesia 11/24/2019   CHOLECYSTECTOMY N/A    Phreesia 11/24/2019   COLONOSCOPY     DILATATION & CURETTAGE/HYSTEROSCOPY WITH MYOSURE N/A 10/28/2021   Procedure: DILATATION & CURETTAGE/HYSTEROSCOPY;  Surgeon: Gloriann Chick, MD;  Location: [facility];  Service: Gynecology;  Laterality: N/A;   DILATION AND CURETTAGE OF UTERUS     EYE SURGERY N/A    Cataracts removed Phreesia 11/24/2019   HIP SURGERY Left 1990   Hip dysplasia cleaned out per patient   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   LAPAROSCOPIC SALPINGO OOPHERECTOMY Bilateral 10/28/2021   Procedure: LAPAROSCOPIC SALPINGO OOPHORECTOMY WITH PELVIC WASHINGS;  Surgeon: Gloriann Chick, MD;  Location: [facility];  Service: Gynecology;  Laterality: Bilateral;   POLYPECTOMY     HPP   TONSILLECTOMY     TRANSFORAMINAL LUMBAR INTERBODY FUSION (TLIF) WITH PEDICLE SCREW FIXATION 1 LEVEL N/A 12/01/2020   Procedure: Transiforaminal Lumbar Interbody Fusion Lumbar four--Lumbar five;  Surgeon: Debby Dorn MATSU, MD;  Location[facility];  Service: Neurosurgery;  Laterality: N/A;   TRIGGER FINGER RELEASE Right 10/28/2020   Long finger   VULVAR LESION REMOVAL N/A 10/28/2021   Procedure: COMPLETE RESECTION OF VULVAR WARTS;  Surgeon: Gloriann Chick, MD;  Location: [facility];  Service: Gynecology;  Laterality: N/A;   Social History   Occupational History   Occupation: Retired  Tobacco Use   Smoking status: Never   Smokeless tobacco: Never  Vaping Use   Vaping status: Never Used  Substance and Sexual Activity   Alcohol use: Never   Drug use: Never   Sexual activity: Not Currently    Birth control/protection: None     "

## 2024-05-16 ENCOUNTER — Other Ambulatory Visit

## 2024-05-20 ENCOUNTER — Ambulatory Visit: Admitting: "Endocrinology

## 2024-05-20 LAB — METANEPHRINES, PLASMA
Metanephrine, Free: 84 pg/mL — ABNORMAL HIGH
Normetanephrine, Free: 337 pg/mL — ABNORMAL HIGH
Total Metanephrines-Plasma: 421 pg/mL — ABNORMAL HIGH

## 2024-05-27 ENCOUNTER — Ambulatory Visit (INDEPENDENT_AMBULATORY_CARE_PROVIDER_SITE_OTHER): Admitting: "Endocrinology

## 2024-05-27 ENCOUNTER — Encounter: Payer: Self-pay | Admitting: "Endocrinology

## 2024-05-27 VITALS — BP 132/80 | HR 56 | Ht 61.0 in | Wt 190.0 lb

## 2024-05-27 DIAGNOSIS — E278 Other specified disorders of adrenal gland: Secondary | ICD-10-CM

## 2024-05-27 MED ORDER — DEXAMETHASONE 1 MG PO TABS: 1.0000 mg | ORAL_TABLET | Freq: Once | ORAL | 0 refills | Status: AC

## 2024-05-27 NOTE — Progress Notes (Signed)
 "   Outpatient Endocrinology Note Obadiah Birmingham, MD    Megan Bradford 1950/08/26 [id number]  Referring Provider: Purcell Emil Schanz, MD Primary Care Provider: Purcell Emil Schanz, MD Reason for consultation: Subjective   Assessment & Plan  Diagnoses and all orders for this visit:  Adrenal mass, left -     Cortisol -     Dexamethasone , blood  Other orders -     dexamethasone  (DECADRON ) 1 MG tablet; Take 1 tablet (1 mg total) by mouth once for 1 dose. Take at 11 pm followed by blood work next morning at 8 am. Timings are specific.   Here for evaluation and management of Left adrenal adenoma  01/13/2022 Adrenals/Urinary Tract: There is a 2.5 cm indeterminate left adrenal nodule. This can be better characterized with MRI on a non-emergent/outpatient basis. The native kidneys are atrophic. There is a right lower quadrant renal transplant. 10/2022: CT ABDOMEN AND PELVIS WITHOUT CONTRAST Adrenals: There is a stable 2.5 x 2.7 cm left adrenal gland nodule with a density of 36 Hounsfield units. No adrenal gland nodule. 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 12/13/2023 baseline labs reported elevated plasma metanephrines and normetanephrine's, other labs normal; not cushingoid features hence dexamethasone  suppression test was not done at this point 02/13/24: Ordered repeat, spoke to the lab technician to make sure the lab Is drawn appropriately and given their instructions to the patient to follow-up the day/night prior of the lab  to assure accuracy; ordered repeat CT abdomen (no contrast given low GFR) 02/28/24: CT abdomen WO reported Homogeneous left adrenal mass measuring 2.7 x 2.6 cm remains stable compared to prior studies, consistent with benign adenoma. 05/17/23: metanephrines and nor metanephrines still elevated albeit less than 3 times the upper limit of normal and hence clinical significant is unclear; continue monitoring. Ordered 1 mg  dexamethasone  suppression test  Return in about 6 months (around 11/24/2024) for visit + labs before next visit, 8 am lab next week.   I have reviewed current medications, nurse's notes, allergies, vital signs, past medical and surgical history, family medical history, and social history for this encounter. Counseled patient on symptoms, examination findings, lab findings, imaging results, treatment decisions and monitoring and prognosis. The patient understood the recommendations and agrees with the treatment plan. All questions regarding treatment plan were fully answered.  Obadiah Birmingham, MD  05/27/2024   History of Present Illness HPI   Megan Bradford is a 74 y.o. female  referred by Dr. Purcell for of Left adrenal adenoma.   Weight is stable Has a lot of stress/anxiety No abdominal pain/nausea/vomiting; reports diverticulosis and loose stools sometimes  No palpitations/tremors/light headedness/blurry vision/head aches   Takes amlodipine , carvedilol  and losartan for her BP  Initial history: 10/2022: CT ABDOMEN AND PELVIS WITHOUT CONTRAST Adrenals: There is a able 2.5 x 2.7 cm left adrenal gland nodule with a density of 36 Hounsfield units. No right adrenal gland nodule. 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  02/28/24: CT ABDOMEN WITHOUT CONTRAST Adrenals/Urinary tract: Homogeneous left adrenal mass measuring 2.7 x 2.6 cm remains stable compared to prior studies, consistent with benign adenoma.   She weight change No moon face No fat pads Yes, only dorsocervical  increased girth Yes plethora No hyperpigmentation No purple striae No proximal muscle weakness No acne No vellus/terminal hirsutism Yes, always had a beard scalp hair loss Yes, some Has a history of HTN a history of hypokalemia No paroxysmal episodes of anxiety  No tremors Yes, sometimes  lightheadedness Yes, occasionally  headache No palpitation No sweating  Yes blurry vision No  No known cancers of self/family Doctors are watching a granuloma in gynecological area per patient s/p B/L salpingo-oophoectomy and current surveillance  S/p renal transplant in 2016 due to ?HTN Not on any steroids now  Physical Exam  BP 132/80   Pulse (!) 56   Ht 5' 1 (1.549 m)   Wt 190 lb (86.2 kg)   SpO2 97%   BMI 35.90 kg/m    Constitutional: well developed, well nourished Head: normocephalic, atraumatic Eyes: sclera anicteric, no redness Neck: supple Lungs: normal respiratory effort Neurology: alert and oriented Skin: dry, no appreciable rashes Musculoskeletal: no appreciable defects Psychiatric: normal mood and affect   Current Medications Patient's Medications  New Prescriptions   DEXAMETHASONE  (DECADRON ) 1 MG TABLET    Take 1 tablet (1 mg total) by mouth once for 1 dose. Take at 11 pm followed by blood work next morning at 8 am. Timings are specific.  Previous Medications   AMLODIPINE  (NORVASC ) 5 MG TABLET    Take 5 mg by mouth daily.   ASPIRIN  (ASPIRIN  81) 81 MG CHEWABLE TABLET    Chew 1 tablet (81 mg total) by mouth 2 (two) times daily. Take one tab po bid x 6 weeks post-op to prevent blood clots   ASPIRIN  EC 81 MG TABLET    Take 81 mg by mouth daily. Swallow whole.   CARVEDILOL  (COREG ) 12.5 MG TABLET    Take 12.5 mg by mouth 2 (two) times daily with a meal.   CETIRIZINE (ZYRTEC) 10 MG TABLET    Take 10 mg by mouth daily.   CHOLECALCIFEROL  (VITAMIN D3) 10 MCG (400 UNIT) CAPS    Take 400 Units by mouth daily.   COENZYME Q10 (CO Q10 PO)    Take 1 capsule by mouth daily.   DICLOFENAC  SODIUM (VOLTAREN ) 1 % GEL    Apply 2 g topically 4 (four) times daily.   DIPHENHYDRAMINE -ACETAMINOPHEN  (TYLENOL  PM) 25-500 MG TABS TABLET    Take 1 tablet by mouth at bedtime as needed (As needed).   HYDROCODONE -ACETAMINOPHEN  (NORCO/VICODIN) 5-325 MG TABLET    Take 1 tablet by mouth 3 (three) times daily as needed for moderate pain (pain score 4-6).   LORAZEPAM   (ATIVAN ) 1 MG TABLET    Take one tablet by mouth prior to your Cardiac CT   LORAZEPAM  (ATIVAN ) 1 MG TABLET    Take 1 tablet (1 mg total) by mouth prior to test on 01/23/24.   LORAZEPAM  (ATIVAN ) 2 MG TABLET    Take 2 mg by mouth 2 (two) times daily as needed.   LOSARTAN (COZAAR) 25 MG TABLET    Take 25 mg by mouth daily.   MAGNESIUM  OXIDE (MAG-OX) 400 MG TABLET    Take 400 mg by mouth daily.   MYCOPHENOLATE  (MYFORTIC ) 180 MG EC TABLET    Take 180 mg by mouth 2 (two) times daily.   ROSUVASTATIN  (CRESTOR ) 20 MG TABLET    TAKE 1 TABLET(20 MG) BY MOUTH DAILY   TACROLIMUS  (PROGRAF ) 1 MG CAPSULE    TAKE 4 CAPSULE BY MOUTH EVERY MORNING AND 2 CAPSULE BY MOUTH EVERY EVENING  Modified Medications   No medications on file  Discontinued Medications   No medications on file    Allergies Allergies  Allergen Reactions   Keflex [Cephalexin] Anaphylaxis    Past Medical History Past Medical History:  Diagnosis Date   Cataract  Phreesia 11/24/2019- removed both   Chronic kidney disease    Phreesia 11/24/2019   Clotting disorder    PE 2010 per pt there really was not a PE   Fistula    LUE; was on dialysis for 11 months   Heart murmur    Hypertension    Phreesia 11/24/2019   Kidney transplanted    transplant team [facility] [place])   Pre-diabetes    no meds   Pulmonary embolism (HCC) 2010   never symptomatic    Past Surgical History Past Surgical History:  Procedure Laterality Date   ANAL RECTAL MANOMETRY N/A 08/03/2021   Procedure: ANO RECTAL MANOMETRY;  Surgeon: Eda Iha, MD;  Location: [facility];  Service: Gastroenterology;  Laterality: N/A;   ARTHRODESIS METATARSALPHALANGEAL JOINT (MTPJ) Left 11/07/2023   Procedure: FUSION, JOINT, GREAT TOE;  Surgeon: Jerri Kay HERO, MD;  Location: [facility];  Service: Orthopedics;  Laterality: Left;  LEFT GREAT TOE ARTHRODESIS   CARDIAC CATHETERIZATION     x2   CESAREAN SECTION N/A    Phreesia 11/24/2019    CHOLECYSTECTOMY N/A    Phreesia 11/24/2019   COLONOSCOPY     DILATATION & CURETTAGE/HYSTEROSCOPY WITH MYOSURE N/A 10/28/2021   Procedure: DILATATION & CURETTAGE/HYSTEROSCOPY;  Surgeon: Gloriann Chick, MD;  Location: [facility];  Service: Gynecology;  Laterality: N/A;   DILATION AND CURETTAGE OF UTERUS     EYE SURGERY N/A    Cataracts removed Phreesia 11/24/2019   HIP SURGERY Left 1990   Hip dysplasia cleaned out per patient   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   LAPAROSCOPIC SALPINGO OOPHERECTOMY Bilateral 10/28/2021   Procedure: LAPAROSCOPIC SALPINGO OOPHORECTOMY WITH PELVIC WASHINGS;  Surgeon: Gloriann Chick, MD;  Location: [facility];  Service: Gynecology;  Laterality: Bilateral;   POLYPECTOMY     HPP   TONSILLECTOMY     TRANSFORAMINAL LUMBAR INTERBODY FUSION (TLIF) WITH PEDICLE SCREW FIXATION 1 LEVEL N/A 12/01/2020   Procedure: Transiforaminal Lumbar Interbody Fusion Lumbar four--Lumbar five;  Surgeon: Debby Dorn MATSU, MD;  Location: [facility];  Service: Neurosurgery;  Laterality: N/A;   TRIGGER FINGER RELEASE Right 10/28/2020   Long finger   VULVAR LESION REMOVAL N/A 10/28/2021   Procedure: COMPLETE RESECTION OF VULVAR WARTS;  Surgeon: Gloriann Chick, MD;  Location: [facility];  Service: Gynecology;  Laterality: N/A;    Family History family history includes Asthma in her mother; Coronary artery disease in her father; Diabetes in her mother; Hypertension in her father.  Social History Social History   Socioeconomic History   Marital status: Widowed    Spouse name: Not on file   Number of children: 2   Years of education: Not on file   Highest education level: Associate degree: occupational, scientist, product/process development, or vocational program  Occupational History   Occupation: Retired  Tobacco Use   Smoking status: Never   Smokeless tobacco: Never  Vaping Use   Vaping status: Never Used  Substance and Sexual Activity   Alcohol use: Never   Drug use:  Never   Sexual activity: Not Currently    Birth control/protection: None  Other Topics Concern   Not on file  Social History Narrative   Lives alone/2025   Social Drivers of Health   Tobacco Use: Low Risk (05/27/2024)   Patient History    Smoking Tobacco Use: Never    Smokeless Tobacco Use: Never    Passive Exposure: Not on file  Financial Resource Strain: Medium Risk (11/29/2023)  Overall Financial Resource Strain (CARDIA)    Difficulty of Paying Living Expenses: Somewhat hard  Food Insecurity: No Food Insecurity (11/29/2023)   Epic    Worried About Programme Researcher, Broadcasting/film/video in the Last Year: Never true    Ran Out of Food in the Last Year: Never true  Transportation Needs: No Transportation Needs (11/29/2023)   Epic    Lack of Transportation (Medical): No    Lack of Transportation (Non-Medical): No  Physical Activity: Insufficiently Active (11/29/2023)   Exercise Vital Sign    Days of Exercise per Week: 3 days    Minutes of Exercise per Session: 20 min  Stress: Stress Concern Present (11/29/2023)   [employer] of Occupational Health - Occupational Stress Questionnaire    Feeling of Stress: To some extent  Social Connections: Unknown (11/29/2023)   Social Connection and Isolation Panel    Frequency of Communication with Friends and Family: More than three times a week    Frequency of Social Gatherings with Friends and Family: Once a week    Attends Religious Services: Patient declined    Database Administrator or Organizations: Patient declined    Attends Banker Meetings: Not on file    Marital Status: Widowed  Recent Concern: Social Connections - Socially Isolated (11/08/2023)   Social Connection and Isolation Panel    Frequency of Communication with Friends and Family: More than three times a week    Frequency of Social Gatherings with Friends and Family: More than three times a week    Attends Religious Services: Never    Database Administrator or  Organizations: No    Attends Banker Meetings: Never    Marital Status: Widowed  Intimate Partner Violence: Patient Unable To Answer (11/08/2023)   Epic    Fear of Current or Ex-Partner: Patient unable to answer    Emotionally Abused: Patient unable to answer    Physically Abused: Patient unable to answer    Sexually Abused: Patient unable to answer  Depression (PHQ2-9): Low Risk (12/03/2023)   Depression (PHQ2-9)    PHQ-2 Score: 0  Alcohol Screen: Low Risk (11/08/2023)   Alcohol Screen    Last Alcohol Screening Score (AUDIT): 0  Housing: High Risk (11/29/2023)   Epic    Unable to Pay for Housing in the Last Year: Yes    Number of Times Moved in the Last Year: 0    Homeless in the Last Year: No  Utilities: Not At Risk (11/08/2023)   Epic    Threatened with loss of utilities: No  Health Literacy: Adequate Health Literacy (11/08/2023)   B1300 Health Literacy    Frequency of need for help with medical instructions: Never    Lab Results  Component Value Date   CHOL 143 10/14/2021   Lab Results  Component Value Date   HDL 56 10/14/2021   Lab Results  Component Value Date   LDLCALC 74 10/14/2021   Lab Results  Component Value Date   TRIG 64 10/14/2021   Lab Results  Component Value Date   CHOLHDL 2.6 10/14/2021   Lab Results  Component Value Date   CREATININE 1.86 (H) 12/13/2023   No results found for: GFR    Component Value Date/Time   NA 145 12/13/2023 1004   NA 146 (H) 11/27/2019 1019   K 3.9 12/13/2023 1004   CL 106 12/13/2023 1004   CO2 29 12/13/2023 1004   GLUCOSE 95 12/13/2023 1004   BUN 33 (  H) 12/13/2023 1004   BUN 20 11/27/2019 1019   CREATININE 1.86 (H) 12/13/2023 1004   CALCIUM  9.5 12/13/2023 1004   CALCIUM  10.2 06/07/2023 0000   PROT 7.3 09/14/2023 1255   PROT 6.6 11/27/2019 1019   ALBUMIN  3.8 09/14/2023 1255   ALBUMIN  4.1 11/27/2019 1019   AST 18 09/14/2023 1255   ALT 9 09/14/2023 1255   ALKPHOS 53 09/14/2023 1255   BILITOT 1.5  (H) 09/14/2023 1255   BILITOT 0.8 11/27/2019 1019   GFRNONAA 26 (L) 11/06/2023 1030   GFRAA 35 (L) 11/27/2019 1019      Latest Ref Rng & Units 12/13/2023   10:04 AM 11/06/2023   10:30 AM 09/14/2023   12:55 PM  BMP  Glucose 65 - 139 mg/dL 95  884  868   BUN 7 - 25 mg/dL 33  20  35   Creatinine 0.60 - 1.00 mg/dL 8.13  7.95  8.04   BUN/Creat Ratio 6 - 22 (calc) 18     Sodium 135 - 146 mmol/L 145  141  137   Potassium 3.5 - 5.3 mmol/L 3.9  3.9  3.8   Chloride 98 - 110 mmol/L 106  104  101   CO2 20 - 32 mmol/L 29  24  26    Calcium  8.6 - 10.4 mg/dL 9.5  9.4  9.8        Component Value Date/Time   WBC 7.8 09/14/2023 1255   RBC 4.83 09/14/2023 1255   HGB 13.7 09/14/2023 1255   HGB 13.6 11/27/2019 1019   HCT 43.7 09/14/2023 1255   HCT 41.3 11/27/2019 1019   PLT 222 09/14/2023 1255   PLT 263 11/27/2019 1019   MCV 90.5 09/14/2023 1255   MCV 86 11/27/2019 1019   MCH 28.4 09/14/2023 1255   MCHC 31.4 09/14/2023 1255   RDW 14.9 09/14/2023 1255   RDW 13.5 11/27/2019 1019   LYMPHSABS 1.1 09/14/2023 1255   LYMPHSABS 1.5 11/27/2019 1019   MONOABS 0.7 09/14/2023 1255   EOSABS 0.1 09/14/2023 1255   EOSABS 0.1 11/27/2019 1019   BASOSABS 0.1 09/14/2023 1255   BASOSABS 0.1 11/27/2019 1019   No results found for: TSH, FREET4       Parts of this note may have been dictated using voice recognition software. There may be variances in spelling and vocabulary which are unintentional. Not all errors are proofread. Please notify the dino if any discrepancies are noted or if the meaning of any statement is not clear.   "

## 2024-06-03 ENCOUNTER — Other Ambulatory Visit

## 2024-06-04 ENCOUNTER — Ambulatory Visit: Payer: Self-pay | Admitting: Emergency Medicine

## 2024-06-04 ENCOUNTER — Encounter: Payer: Self-pay | Admitting: Emergency Medicine

## 2024-06-04 ENCOUNTER — Ambulatory Visit: Admitting: Emergency Medicine

## 2024-06-04 VITALS — BP 116/72 | HR 61 | Temp 97.9°F | Ht 61.0 in | Wt 192.6 lb

## 2024-06-04 DIAGNOSIS — I2510 Atherosclerotic heart disease of native coronary artery without angina pectoris: Secondary | ICD-10-CM | POA: Diagnosis not present

## 2024-06-04 DIAGNOSIS — E0822 Diabetes mellitus due to underlying condition with diabetic chronic kidney disease: Secondary | ICD-10-CM

## 2024-06-04 DIAGNOSIS — Z940 Kidney transplant status: Secondary | ICD-10-CM

## 2024-06-04 DIAGNOSIS — I5032 Chronic diastolic (congestive) heart failure: Secondary | ICD-10-CM

## 2024-06-04 DIAGNOSIS — E66813 Obesity, class 3: Secondary | ICD-10-CM | POA: Insufficient documentation

## 2024-06-04 DIAGNOSIS — E785 Hyperlipidemia, unspecified: Secondary | ICD-10-CM

## 2024-06-04 DIAGNOSIS — N184 Chronic kidney disease, stage 4 (severe): Secondary | ICD-10-CM | POA: Diagnosis not present

## 2024-06-04 DIAGNOSIS — I10 Essential (primary) hypertension: Secondary | ICD-10-CM | POA: Diagnosis not present

## 2024-06-04 LAB — HEMOGLOBIN A1C: Hgb A1c MFr Bld: 6.3 % (ref 4.6–6.5)

## 2024-06-04 LAB — CBC WITH DIFFERENTIAL/PLATELET
Basophils Absolute: 0.1 K/uL (ref 0.0–0.1)
Basophils Relative: 0.9 % (ref 0.0–3.0)
Eosinophils Absolute: 0.1 K/uL (ref 0.0–0.7)
Eosinophils Relative: 1.8 % (ref 0.0–5.0)
HCT: 37.8 % (ref 36.0–46.0)
Hemoglobin: 12.7 g/dL (ref 12.0–15.0)
Lymphocytes Relative: 22.3 % (ref 12.0–46.0)
Lymphs Abs: 1.9 K/uL (ref 0.7–4.0)
MCHC: 33.6 g/dL (ref 30.0–36.0)
MCV: 88.2 fl (ref 78.0–100.0)
Monocytes Absolute: 1.2 K/uL — ABNORMAL HIGH (ref 0.1–1.0)
Monocytes Relative: 13.8 % — ABNORMAL HIGH (ref 3.0–12.0)
Neutro Abs: 5.2 K/uL (ref 1.4–7.7)
Neutrophils Relative %: 61.2 % (ref 43.0–77.0)
Platelets: 217 K/uL (ref 150.0–400.0)
RBC: 4.29 Mil/uL (ref 3.87–5.11)
RDW: 16.5 % — ABNORMAL HIGH (ref 11.5–15.5)
WBC: 8.5 K/uL (ref 4.0–10.5)

## 2024-06-04 LAB — COMPREHENSIVE METABOLIC PANEL WITH GFR
ALT: 10 U/L (ref 3–35)
AST: 17 U/L (ref 5–37)
Albumin: 3.6 g/dL (ref 3.5–5.2)
Alkaline Phosphatase: 57 U/L (ref 39–117)
BUN: 26 mg/dL — ABNORMAL HIGH (ref 6–23)
CO2: 31 meq/L (ref 19–32)
Calcium: 9.8 mg/dL (ref 8.4–10.5)
Chloride: 104 meq/L (ref 96–112)
Creatinine, Ser: 1.85 mg/dL — ABNORMAL HIGH (ref 0.40–1.20)
GFR: 26.97 mL/min — ABNORMAL LOW
Glucose, Bld: 107 mg/dL — ABNORMAL HIGH (ref 70–99)
Potassium: 3.7 meq/L (ref 3.5–5.1)
Sodium: 141 meq/L (ref 135–145)
Total Bilirubin: 0.7 mg/dL (ref 0.2–1.2)
Total Protein: 6.9 g/dL (ref 6.0–8.3)

## 2024-06-04 LAB — LIPID PANEL
Cholesterol: 138 mg/dL (ref 28–200)
HDL: 67.2 mg/dL
LDL Cholesterol: 60 mg/dL (ref 10–99)
NonHDL: 70.88
Total CHOL/HDL Ratio: 2
Triglycerides: 56 mg/dL (ref 10.0–149.0)
VLDL: 11.2 mg/dL (ref 0.0–40.0)

## 2024-06-04 NOTE — Assessment & Plan Note (Signed)
 Diet and nutrition discussed BMI 36.39

## 2024-06-04 NOTE — Progress Notes (Signed)
 Megan Bradford 74 y.o.   Chief Complaint  Patient presents with   Medical Management of Chronic Issues    6 Month follow up    HISTORY OF PRESENT ILLNESS: This is a 74 y.o. female here for follow-up on chronic medical conditions Overall doing well. Has no complaints or medical concerns today.  HPI   Prior to Admission medications  Medication Sig Start Date End Date Taking? Authorizing Provider  amLODipine  (NORVASC ) 5 MG tablet Take 5 mg by mouth daily.   Yes [provider]  aspirin  EC 81 MG tablet Take 81 mg by mouth daily. Swallow whole.   Yes [provider]  carvedilol  (COREG ) 12.5 MG tablet Take 12.5 mg by mouth 2 (two) times daily with a meal.   Yes [provider]  cetirizine (ZYRTEC) 10 MG tablet Take 10 mg by mouth daily.   Yes [provider]  Cholecalciferol  (VITAMIN D3) 10 MCG (400 UNIT) CAPS Take 400 Units by mouth daily.   Yes [provider]  Coenzyme Q10 (CO Q10 PO) Take 1 capsule by mouth daily.   Yes [provider]  HYDROcodone -acetaminophen  (NORCO/VICODIN) 5-325 MG tablet Take 1 tablet by mouth 3 (three) times daily as needed for moderate pain (pain score 4-6). 10/30/23  Yes Jule Ronal CROME, [name]  LORazepam  (ATIVAN ) 2 MG tablet Take 2 mg by mouth 2 (two) times daily as needed. 01/10/22  Yes [provider]  losartan (COZAAR) 25 MG tablet Take 25 mg by mouth daily. 12/19/22  Yes [provider]  rosuvastatin  (CRESTOR ) 20 MG tablet TAKE 1 TABLET(20 MG) BY MOUTH DAILY 02/01/24  Yes Nishan, Peter C, MD  tacrolimus  (PROGRAF ) 1 MG capsule TAKE 4 CAPSULE BY MOUTH EVERY MORNING AND 2 CAPSULE BY MOUTH EVERY EVENING 04/25/24  Yes Osten Janek, Emil Schanz, MD  diphenhydramine -acetaminophen  (TYLENOL  PM) 25-500 MG TABS tablet Take 1 tablet by mouth at bedtime as needed (As needed). Patient not taking: Reported on 06/04/2024    [provider]  mycophenolate  (MYFORTIC ) 180 MG EC tablet Take 180 mg by  mouth 2 (two) times daily. 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   Adrenal mass, left 02/02/2022   Diverticulosis 02/02/2022   Dyslipidemia 10/20/2021   Chronic diastolic CHF (congestive heart failure) (HCC) 10/14/2021   Hyperlipidemia 10/14/2021   Dyssynergic defecation    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/01/2020   Coronary artery disease involving native coronary artery of native heart 08/30/2020   Immunosuppression 08/30/2020   History of kidney transplant 11/27/2019   Essential hypertension 11/27/2019   Prediabetes 11/27/2019   Obesity (BMI 30-39.9) 11/27/2019    Past Medical History:  Diagnosis Date   Cataract    Phreesia 11/24/2019- removed both   Chronic kidney disease    Phreesia 11/24/2019   Clotting disorder    PE 2010 per pt there really was not a PE   Fistula    LUE; was on dialysis for 11 months   Heart murmur    Hypertension    Phreesia 11/24/2019   Kidney transplanted    transplant team [facility], [place])   Pre-diabetes    no meds   Pulmonary embolism (HCC) 2010   never  symptomatic    Past Surgical History:  Procedure Laterality Date   ANAL RECTAL MANOMETRY N/A 08/03/2021   Procedure: ANO RECTAL MANOMETRY;  Surgeon: Eda Iha, MD;  Location: [facility];  Service: Gastroenterology;  Laterality: N/A;   ARTHRODESIS METATARSALPHALANGEAL JOINT (MTPJ) Left 11/07/2023   Procedure: FUSION, JOINT, GREAT TOE;  Surgeon: Jerri Kay HERO, MD;  Location: [facility];  Service: Orthopedics;  Laterality: Left;  LEFT GREAT TOE ARTHRODESIS   CARDIAC CATHETERIZATION     x2   CESAREAN SECTION N/A    Phreesia 11/24/2019   CHOLECYSTECTOMY N/A     Phreesia 11/24/2019   COLONOSCOPY     DILATATION & CURETTAGE/HYSTEROSCOPY WITH MYOSURE N/A 10/28/2021   Procedure: DILATATION & CURETTAGE/HYSTEROSCOPY;  Surgeon: Gloriann Chick, MD;  Location: [facility];  Service: Gynecology;  Laterality: N/A;   DILATION AND CURETTAGE OF UTERUS     EYE SURGERY N/A    Cataracts removed Phreesia 11/24/2019   HIP SURGERY Left 1990   Hip dysplasia cleaned out per patient   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   LAPAROSCOPIC SALPINGO OOPHERECTOMY Bilateral 10/28/2021   Procedure: LAPAROSCOPIC SALPINGO OOPHORECTOMY WITH PELVIC WASHINGS;  Surgeon: Gloriann Chick, MD;  Location: [facility];  Service: Gynecology;  Laterality: Bilateral;   POLYPECTOMY     HPP   TONSILLECTOMY     TRANSFORAMINAL LUMBAR INTERBODY FUSION (TLIF) WITH PEDICLE SCREW FIXATION 1 LEVEL N/A 12/01/2020   Procedure: Transiforaminal Lumbar Interbody Fusion Lumbar four--Lumbar five;  Surgeon: Debby Dorn MATSU, MD;  Location: [facility];  Service: Neurosurgery;  Laterality: N/A;   TRIGGER FINGER RELEASE Right 10/28/2020   Long finger   VULVAR LESION REMOVAL N/A 10/28/2021   Procedure: COMPLETE RESECTION OF VULVAR WARTS;  Surgeon: Gloriann Chick, MD;  Location:  SURGERY CENTER;  Service: Gynecology;  Laterality: N/A;    Social History   Socioeconomic History   Marital status: Widowed    Spouse name: Not on file   Number of children: 2   Years of education: Not on file   Highest education level: Associate degree: occupational, scientist, product/process development, or vocational program  Occupational History   Occupation: Retired  Tobacco Use   Smoking status: Never   Smokeless tobacco: Never  Vaping Use   Vaping status: Never Used  Substance and Sexual Activity   Alcohol use: Never   Drug use: Never   Sexual activity: Not Currently    Birth control/protection: None  Other Topics Concern   Not on file  Social History Narrative   Lives alone/2025   Social Drivers of Health   Tobacco  Use: Low Risk (06/04/2024)   Patient History    Smoking Tobacco Use: Never    Smokeless Tobacco Use: Never    Passive Exposure: Not on file  Financial Resource Strain: Low Risk (05/31/2024)   Overall Financial Resource Strain (CARDIA)    Difficulty of Paying Living Expenses: Not hard at all  Food Insecurity: No Food Insecurity (05/31/2024)   Epic    Worried About Programme Researcher, Broadcasting/film/video in the Last Year: Never true    Ran Out of Food in the Last Year: Never true  Transportation Needs: No Transportation Needs (05/31/2024)   Epic    Lack of Transportation (Medical): No    Lack of Transportation (Non-Medical): No  Physical Activity: Insufficiently Active (05/31/2024)   Exercise Vital Sign    Days of Exercise per Week: 3 days    Minutes of Exercise per Session: 20 min  Stress: Stress Concern Present (05/31/2024)   [employer] of Occupational Health - Occupational Stress Questionnaire    Feeling of Stress: To some extent  Social Connections: Unknown (05/31/2024)   Social Connection and Isolation Panel    Frequency of Communication with Friends and Family: More than three times a week    Frequency of Social Gatherings with Friends and Family: Once a week    Attends Religious Services: 1 to 4 times per year    Active Member of [employer] or Organizations: Patient declined    Attends Banker Meetings: Not on file    Marital Status: Widowed  Intimate Partner Violence: Patient Unable To Answer (11/08/2023)   Epic    Fear of Current or Ex-Partner: Patient unable to answer    Emotionally Abused: Patient unable to answer    Physically Abused: Patient unable to answer    Sexually Abused: Patient unable to answer  Depression (PHQ2-9): Low Risk (12/03/2023)   Depression (PHQ2-9)    PHQ-2 Score: 0  Alcohol Screen: Low Risk (05/31/2024)   Alcohol Screen    Last Alcohol Screening Score (AUDIT): 0  Housing: Low Risk (05/31/2024)   Epic    Unable to Pay for Housing in the Last Year: No     Number of Times Moved in the Last Year: 0    Homeless in the Last Year: No  Utilities: Not At Risk (11/08/2023)   Epic    Threatened with loss of utilities: No  Health Literacy: Adequate Health Literacy (11/08/2023)   B1300 Health Literacy    Frequency of need for help with medical instructions: Never    Family History  Problem Relation Age of Onset   Diabetes Mother    Asthma Mother    Hypertension Father    Coronary artery disease Father    Colon cancer Neg Hx    Colon polyps Neg Hx    Esophageal cancer Neg Hx    Rectal cancer Neg Hx    Stomach cancer Neg Hx    Breast cancer Neg Hx    Ovarian cancer Neg Hx    Endometrial cancer Neg Hx    Pancreatic cancer Neg Hx    Prostate cancer Neg Hx      Review of Systems  Constitutional: Negative.  Negative for chills and fever.  HENT: Negative.  Negative for congestion and sore throat.   Respiratory: Negative.  Negative for cough and shortness of breath.   Cardiovascular: Negative.  Negative for chest pain and palpitations.  Gastrointestinal:  Negative for abdominal pain, diarrhea, nausea and vomiting.  Genitourinary: Negative.  Negative for dysuria and hematuria.       Does not urinate a whole lot.  Skin: Negative.  Negative for rash.  Neurological: Negative.  Negative for dizziness and headaches.  All other systems reviewed and are negative.   Vitals:   06/04/24 0957  BP: 116/72  Pulse: 61  Temp: 97.9 F (36.6 C)  SpO2: 99%    Physical Exam Vitals reviewed.  Constitutional:      Appearance: Normal appearance. She is obese.  HENT:     Head: Normocephalic.     Mouth/Throat:     Mouth: Mucous membranes are moist.     Pharynx: Oropharynx is clear.  Eyes:     Extraocular Movements: Extraocular movements intact.     Pupils: Pupils are equal, round, and reactive to light.  Cardiovascular:     Rate and Rhythm: Normal rate and regular rhythm.  Pulses: Normal pulses.     Heart sounds: Normal heart sounds.   Pulmonary:     Effort: Pulmonary effort is normal.     Breath sounds: Normal breath sounds.  Musculoskeletal:     Cervical back: No tenderness.     Right lower leg: No edema.     Left lower leg: No edema.  Lymphadenopathy:     Cervical: No cervical adenopathy.  Skin:    General: Skin is warm and dry.  Neurological:     General: No focal deficit present.     Mental Status: She is alert and oriented to person, place, and time.  Psychiatric:        Mood and Affect: Mood normal.        Behavior: Behavior normal.      ASSESSMENT & PLAN: A total of 40 minutes was spent with the patient and counseling/coordination of care regarding preparing for this visit, review of most recent office visit notes, review of multiple chronic medical conditions and their management, review of all medications, review of most recent bloodwork results, review of health maintenance items, education on nutrition, prognosis, documentation, and need for follow up.   Problem List Items Addressed This Visit       Cardiovascular and Mediastinum   Essential hypertension - Primary   BP Readings from Last 3 Encounters:  06/04/24 116/72  05/27/24 132/80  02/13/24 110/70  Well-controlled hypertension with normal blood pressure readings at home Continue amlodipine  5 mg daily, lisinopril  5 mg daily       Coronary artery disease involving native coronary artery of native heart   Stable without recent anginal episodes Continues daily baby aspirin  and carvedilol  12.5 mg twice a day      Chronic diastolic CHF (congestive heart failure) (HCC)   Stable. Recent echocardiogram report reviewed. No significant abnormalities.       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, without long-term current use of insulin (HCC)   Stable chronic  conditions Hemoglobin A1c done today        Relevant Orders   CBC with Differential/Platelet   Comprehensive metabolic panel with GFR   Hemoglobin A1c   Lipid panel     Genitourinary   Stage 4 chronic kidney disease (HCC)   Advised to stay well-hydrated and avoid NSAIDs Blood work done today Chronic stable condition      Relevant Orders   CBC with Differential/Platelet   Comprehensive metabolic panel with GFR   Hemoglobin A1c   Lipid panel     Other   History of kidney transplant   Continues to follow-up with nephrologist Continues Myfortic  180 mg twice a day and tacrolimus  4 mg twice a day      Dyslipidemia   Chronic stable condition Continues rosuvastatin  20 mg daily      Obesity, class 3 (HCC)   Diet and nutrition discussed BMI 36.39       Relevant Orders   CBC with Differential/Platelet   Comprehensive metabolic panel with GFR   Hemoglobin A1c   Lipid panel   Patient Instructions  Health Maintenance After Age 2 After age 106, you are at a higher risk for certain long-term diseases and infections as well as injuries from falls. Falls are a major cause of broken bones and head injuries  in people who are older than age 26. Getting regular preventive care can help to keep you healthy and well. Preventive care includes getting regular testing and making lifestyle changes as recommended by your health care provider. Talk with your health care provider about: Which screenings and tests you should have. A screening is a test that checks for a disease when you have no symptoms. A diet and exercise plan that is right for you. What should I know about screenings and tests to prevent falls? Screening and testing are the best ways to find a health problem early. Early diagnosis and treatment give you the best chance of managing medical conditions that are common after age 79. Certain conditions and lifestyle choices may make you more likely to have a fall. Your health  care provider may recommend: Regular vision checks. Poor vision and conditions such as cataracts can make you more likely to have a fall. If you wear glasses, make sure to get your prescription updated if your vision changes. Medicine review. Work with your health care provider to regularly review all of the medicines you are taking, including over-the-counter medicines. Ask your health care provider about any side effects that may make you more likely to have a fall. Tell your health care provider if any medicines that you take make you feel dizzy or sleepy. Strength and balance checks. Your health care provider may recommend certain tests to check your strength and balance while standing, walking, or changing positions. Foot health exam. Foot pain and numbness, as well as not wearing proper footwear, can make you more likely to have a fall. Screenings, including: Osteoporosis screening. Osteoporosis is a condition that causes the bones to get weaker and break more easily. Blood pressure screening. Blood pressure changes and medicines to control blood pressure can make you feel dizzy. Depression screening. You may be more likely to have a fall if you have a fear of falling, feel depressed, or feel unable to do activities that you used to do. Alcohol use screening. Using too much alcohol can affect your balance and may make you more likely to have a fall. Follow these instructions at home: Lifestyle Do not drink alcohol if: Your health care provider tells you not to drink. If you drink alcohol: Limit how much you have to: 0-1 drink a day for women. 0-2 drinks a day for men. Know how much alcohol is in your drink. In the [place] one drink equals one 12 oz bottle of beer (355 mL), one 5 oz glass of wine (148 mL), or one 1 oz glass of hard liquor (44 mL). Do not use any products that contain nicotine or tobacco. These products include cigarettes, chewing tobacco, and vaping devices, such as  e-cigarettes. If you need help quitting, ask your health care provider. Activity  Follow a regular exercise program to stay fit. This will help you maintain your balance. Ask your health care provider what types of exercise are appropriate for you. If you need a cane or walker, use it as recommended by your health care provider. Wear supportive shoes that have nonskid soles. Safety  Remove any tripping hazards, such as rugs, cords, and clutter. Install safety equipment such as grab bars in bathrooms and safety rails on stairs. Keep rooms and walkways well-lit. General instructions Talk with your health care provider about your risks for falling. Tell your health care provider if: You fall. Be sure to tell your health care provider about all falls, even ones that  seem minor. You feel dizzy, tiredness (fatigue), or off-balance. Take over-the-counter and prescription medicines only as told by your health care provider. These include supplements. Eat a healthy diet and maintain a healthy weight. A healthy diet includes low-fat dairy products, low-fat (lean) meats, and fiber from whole grains, beans, and lots of fruits and vegetables. Stay current with your vaccines. Schedule regular health, dental, and eye exams. Summary Having a healthy lifestyle and getting preventive care can help to protect your health and wellness after age 68. Screening and testing are the best way to find a health problem early and help you avoid having a fall. Early diagnosis and treatment give you the best chance for managing medical conditions that are more common for people who are older than age 33. Falls are a major cause of broken bones and head injuries in people who are older than age 61. Take precautions to prevent a fall at home. Work with your health care provider to learn what changes you can make to improve your health and wellness and to prevent falls. This information is not intended to replace advice given  to you by your health care provider. Make sure you discuss any questions you have with your health care provider. Document Revised: 09/20/2020 Document Reviewed: 09/20/2020 Elsevier Patient Education  2024 Elsevier Inc.    Emil Schaumann, MD [facility]     [1]  Allergies Allergen Reactions   Keflex [Cephalexin] Anaphylaxis

## 2024-06-04 NOTE — Assessment & Plan Note (Signed)
 Advised to stay well-hydrated and avoid NSAIDs Blood work done today Chronic stable condition

## 2024-06-04 NOTE — Assessment & Plan Note (Signed)
 Stable chronic conditions Hemoglobin A1c done today

## 2024-06-04 NOTE — Assessment & Plan Note (Signed)
Stable.  Recent echocardiogram report reviewed.  No significant abnormalities.

## 2024-06-04 NOTE — Patient Instructions (Signed)
 Health Maintenance After Age 73 After age 27, you are at a higher risk for certain long-term diseases and infections as well as injuries from falls. Falls are a major cause of broken bones and head injuries in people who are older than age 73. Getting regular preventive care can help to keep you healthy and well. Preventive care includes getting regular testing and making lifestyle changes as recommended by your health care provider. Talk with your health care provider about: Which screenings and tests you should have. A screening is a test that checks for a disease when you have no symptoms. A diet and exercise plan that is right for you. What should I know about screenings and tests to prevent falls? Screening and testing are the best ways to find a health problem early. Early diagnosis and treatment give you the best chance of managing medical conditions that are common after age 90. Certain conditions and lifestyle choices may make you more likely to have a fall. Your health care provider may recommend: Regular vision checks. Poor vision and conditions such as cataracts can make you more likely to have a fall. If you wear glasses, make sure to get your prescription updated if your vision changes. Medicine review. Work with your health care provider to regularly review all of the medicines you are taking, including over-the-counter medicines. Ask your health care provider about any side effects that may make you more likely to have a fall. Tell your health care provider if any medicines that you take make you feel dizzy or sleepy. Strength and balance checks. Your health care provider may recommend certain tests to check your strength and balance while standing, walking, or changing positions. Foot health exam. Foot pain and numbness, as well as not wearing proper footwear, can make you more likely to have a fall. Screenings, including: Osteoporosis screening. Osteoporosis is a condition that causes  the bones to get weaker and break more easily. Blood pressure screening. Blood pressure changes and medicines to control blood pressure can make you feel dizzy. Depression screening. You may be more likely to have a fall if you have a fear of falling, feel depressed, or feel unable to do activities that you used to do. Alcohol  use screening. Using too much alcohol  can affect your balance and may make you more likely to have a fall. Follow these instructions at home: Lifestyle Do not drink alcohol  if: Your health care provider tells you not to drink. If you drink alcohol : Limit how much you have to: 0-1 drink a day for women. 0-2 drinks a day for men. Know how much alcohol  is in your drink. In the [place] one drink equals one 12 oz bottle of beer (355 mL), one 5 oz glass of wine (148 mL), or one 1 oz glass of hard liquor (44 mL). Do not use any products that contain nicotine or tobacco. These products include cigarettes, chewing tobacco, and vaping devices, such as e-cigarettes. If you need help quitting, ask your health care provider. Activity  Follow a regular exercise program to stay fit. This will help you maintain your balance. Ask your health care provider what types of exercise are appropriate for you. If you need a cane or walker, use it as recommended by your health care provider. Wear supportive shoes that have nonskid soles. Safety  Remove any tripping hazards, such as rugs, cords, and clutter. Install safety equipment such as grab bars in bathrooms and safety rails on stairs. Keep rooms and walkways  well-lit. General instructions Talk with your health care provider about your risks for falling. Tell your health care provider if: You fall. Be sure to tell your health care provider about all falls, even ones that seem minor. You feel dizzy, tiredness (fatigue), or off-balance. Take over-the-counter and prescription medicines only as told by your health care provider. These include  supplements. Eat a healthy diet and maintain a healthy weight. A healthy diet includes low-fat dairy products, low-fat (lean) meats, and fiber from whole grains, beans, and lots of fruits and vegetables. Stay current with your vaccines. Schedule regular health, dental, and eye exams. Summary Having a healthy lifestyle and getting preventive care can help to protect your health and wellness after age 15. Screening and testing are the best way to find a health problem early and help you avoid having a fall. Early diagnosis and treatment give you the best chance for managing medical conditions that are more common for people who are older than age 42. Falls are a major cause of broken bones and head injuries in people who are older than age 64. Take precautions to prevent a fall at home. Work with your health care provider to learn what changes you can make to improve your health and wellness and to prevent falls. This information is not intended to replace advice given to you by your health care provider. Make sure you discuss any questions you have with your health care provider. Document Revised: 09/20/2020 Document Reviewed: 09/20/2020 Elsevier Patient Education  2024 [employer].

## 2024-06-04 NOTE — Assessment & Plan Note (Signed)
 BP Readings from Last 3 Encounters:  06/04/24 116/72  05/27/24 132/80  02/13/24 110/70  Well-controlled hypertension with normal blood pressure readings at home Continue amlodipine  5 mg daily, lisinopril  5 mg daily

## 2024-06-04 NOTE — Assessment & Plan Note (Signed)
Continues to follow-up with nephrologist Continues Myfortic 180 mg twice a day and tacrolimus 4 mg twice a day

## 2024-06-04 NOTE — Assessment & Plan Note (Signed)
Stable without recent anginal episodes Continues daily baby aspirin and carvedilol 12.5 mg twice a day

## 2024-06-04 NOTE — Assessment & Plan Note (Signed)
Chronic stable condition Continues rosuvastatin 20 mg daily

## 2024-06-12 LAB — DEXAMETHASONE, BLOOD: Dexamethasone, Serum: 825 ng/dL

## 2024-06-12 LAB — CORTISOL: Cortisol, Plasma: 4.1 ug/dL

## 2024-06-16 ENCOUNTER — Ambulatory Visit: Payer: Self-pay | Admitting: "Endocrinology

## 2024-06-16 DIAGNOSIS — E278 Other specified disorders of adrenal gland: Secondary | ICD-10-CM

## 2024-06-20 ENCOUNTER — Ambulatory Visit

## 2024-08-05 ENCOUNTER — Ambulatory Visit: Admitting: Orthopaedic Surgery

## 2024-08-05 ENCOUNTER — Ambulatory Visit: Admitting: Physician Assistant

## 2024-10-21 ENCOUNTER — Ambulatory Visit: Admitting: Gynecologic Oncology

## 2024-11-10 ENCOUNTER — Ambulatory Visit

## 2024-11-17 ENCOUNTER — Other Ambulatory Visit

## 2024-11-24 ENCOUNTER — Ambulatory Visit: Admitting: "Endocrinology

## 2024-12-03 ENCOUNTER — Encounter: Admitting: Emergency Medicine
